# Patient Record
Sex: Female | Born: 1995
Health system: Southern US, Community
[De-identification: ages and names within clinical notes are randomized; demographics above are authoritative.]

## PROBLEM LIST (undated history)

## (undated) DIAGNOSIS — F419 Anxiety disorder, unspecified: Secondary | ICD-10-CM

## (undated) DIAGNOSIS — Z87442 Personal history of urinary calculi: Secondary | ICD-10-CM

## (undated) DIAGNOSIS — N83201 Unspecified ovarian cyst, right side: Secondary | ICD-10-CM

## (undated) DIAGNOSIS — D649 Anemia, unspecified: Secondary | ICD-10-CM

## (undated) DIAGNOSIS — N200 Calculus of kidney: Secondary | ICD-10-CM

## (undated) HISTORY — DX: Anxiety disorder, unspecified: F41.9

## (undated) HISTORY — DX: Anemia, unspecified: D64.9

## (undated) HISTORY — DX: Calculus of kidney: N20.0

## (undated) HISTORY — PX: KNEE SURGERY: SHX244

---
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
## 2017-03-04 ENCOUNTER — Encounter: Payer: Self-pay | Admitting: Family Medicine

## 2017-03-04 ENCOUNTER — Ambulatory Visit (INDEPENDENT_AMBULATORY_CARE_PROVIDER_SITE_OTHER): Payer: 59 | Admitting: Family Medicine

## 2017-03-04 VITALS — BP 118/74 | HR 73 | Temp 98.2°F | Resp 16 | Ht 63.0 in | Wt 131.8 lb

## 2017-03-04 DIAGNOSIS — F411 Generalized anxiety disorder: Secondary | ICD-10-CM

## 2017-03-04 DIAGNOSIS — F410 Panic disorder [episodic paroxysmal anxiety] without agoraphobia: Secondary | ICD-10-CM | POA: Diagnosis not present

## 2017-03-04 DIAGNOSIS — Z87448 Personal history of other diseases of urinary system: Secondary | ICD-10-CM

## 2017-03-04 DIAGNOSIS — G8929 Other chronic pain: Secondary | ICD-10-CM

## 2017-03-04 DIAGNOSIS — Z7689 Persons encountering health services in other specified circumstances: Secondary | ICD-10-CM

## 2017-03-04 DIAGNOSIS — D500 Iron deficiency anemia secondary to blood loss (chronic): Secondary | ICD-10-CM | POA: Diagnosis not present

## 2017-03-04 DIAGNOSIS — R109 Unspecified abdominal pain: Secondary | ICD-10-CM

## 2017-03-04 DIAGNOSIS — N920 Excessive and frequent menstruation with regular cycle: Secondary | ICD-10-CM | POA: Diagnosis not present

## 2017-03-04 DIAGNOSIS — N200 Calculus of kidney: Secondary | ICD-10-CM | POA: Insufficient documentation

## 2017-03-04 DIAGNOSIS — Z87442 Personal history of urinary calculi: Secondary | ICD-10-CM | POA: Diagnosis not present

## 2017-03-04 DIAGNOSIS — D649 Anemia, unspecified: Secondary | ICD-10-CM | POA: Insufficient documentation

## 2017-03-04 MED ORDER — ESCITALOPRAM OXALATE 10 MG PO TABS: 10.0000 mg | ORAL_TABLET | Freq: Every day | ORAL | 11 refills | Status: DC

## 2017-03-04 MED ORDER — FERROUS SULFATE DRIED ER 160 (50 FE) MG PO TBCR: 160.0000 mg | EXTENDED_RELEASE_TABLET | Freq: Every day | ORAL | 11 refills | Status: DC

## 2017-03-04 NOTE — Assessment & Plan Note (Signed)
Likely secondary to prior nephrolithiasis Improved on last UA 11/2016

## 2017-03-04 NOTE — Assessment & Plan Note (Signed)
Suspected acute on chronic GAD now with gradual worsening causing more difficulty functioning, previously coped well now with stressors of college / full time job. Complicated with panic symptoms. -GAD7: 8, somewhat difficult / PHQ9: 0 - Failed: Fluoxetine (discontinued, preference no side effects), Celexa (groggy side effects) - Prior Psychiatry [place]  Plan: 1. Discussion on GAD /anxiety, management, complications with panic 2. Start Escitalopram 10mg  daily AM with food, counseling on potential side effects risks, reviewed possible GI intolerance, insomnia (although likely to improve this given anxiety likely source of insomnia), sexual dysfunction, reviewed black box warning inc suicidal (no prior history, unlikely concern) - anticipate 4-6 weeks for notable effect, may need titrate dose to 20 in future 3. Advised recommend therapy / counseling in future - handout given with self referral info, also consider future return to Psychiatry if needed 4. Follow-up 4-6 weeks anxiety, med adjust, GAD7/PHQ9 - or if improved on med, no concerns, can continue current dose and follow-up 6 months

## 2017-03-04 NOTE — Progress Notes (Signed)
Subjective:    Patient ID: Patricia Pearson, female    DOB: 08-22-1996, 21 y.o.   MRN: [id number]  Patricia Pearson is a 21 y.o. female presenting on 03/04/2017 for Establish Care  Previous PCP Dr Raechel Ache [facility]). Here to establish care, after her Mother Loel Dubonnet) recently also established care with me here at [facility].  HPI  Anxiety: - Reports chronic problem since young age, described feeling stressed and anxious even in public places as child. Significant past history with followed by Psychiatry in [place], previously treated on medication, thinks this was Fluoxetine, did well but then ultimately her mother decided to discontinue her on it due to young age, later had worse anxiety and returned, she was started on Celexa in past >1-2 years ago had problems with sedation and feeling "groggy" on this medication, since discontinued. Additionally in past has been treated in ED 02/2016 with Ativan PRN for panic attacks but did not take this medication long. - Today here to discuss some gradually worsening anxiety and interested in resuming medical therapy. Attributes recent problems to increased stress with both college [employer] program and also working as full Public house manager), often difficulty focusing on job with stress. Most days if anxiety is not bothering her she functions well. She can have some panic attacks about 2-4 times a month usually, recently a little worse. Describes symptoms with panic as feeling jittery, dyspnea, hard to catch breath, heart racing, palpitations, if only minor stressor lasts few minutes, if more significant stressor can last hours, for instance argument with boyfriend recently, since resolved - Family history of anxiety with mother. Parents divorced when younger. Father passed when she was 19 years old, he was into drinking and drugs. Has younger sister age 53 without health problems. - Limited caffeine. Occasional energy drink if working long 12+ hour  shift - Feels safe in current relationship and at home - Denies regular insomnia, staying awake - Denies any sadness, depression or mood disorder, suicidal or homicidal ideation  Anemia, Iron Deficiency, history of chronic blood loss / history of menorrhagia - Reviews prior history of episode 02/2016, at work, felt dizzy, weak, almost passed out, went to ED, had Hgb 10s and MCV 70, with improvement on oral OTC iron daily 160mg  slow release. Has history of heavy menstrual bleeding, on OCP on for 2 years, had been off and on since age 68, still has mild to moderate amount of bleeding, now only has 3-4 days of bleeding with regular monthly cycles, previously had longer duration about 7 days - Currently doing well without complaints - Last labs done with CBC 11/2016, improved Hgb and MCV - Requests refill on her iron pill - Denies any dyspnea, CP, fatigue, near syncope  History of Chronic Kidney Stones - Prior chronic history with significant R nephrolithiasis previously, followed by [facility] Dr Marcelline Mates had VCUG and Cystourethroscopy with Stent placement and stone removal. See outside records in [place]. Also has had history of microscopic hematuria in past, thought related to nephrolithiasis. - Now also reports recent history of similar symptoms R sided flank discomfort concern for possible kidney stone recurrence. Recently saw prior PCP 11/30/16, for same issue, R side, same as before, was not straining urine, did not have color change, increased water and took medicines with improvement after 1 week. - She can feel some discomfort on R side if drinks tea and soda and less water intake, was told in past "Right kidney drains slower compared  to Left", thought due to "old scarring"  GAD 7 : Generalized Anxiety Score 03/04/2017 03/04/2017  Nervous, Anxious, on Edge 2 2  Control/stop worrying 3 3  Worry too much - different things 3 3  Trouble relaxing 0 0  Restless 0 0  Easily annoyed  or irritable 0 0  Afraid - awful might happen 0 0  Total GAD 7 Score 8 8  Anxiety Difficulty - Somewhat difficult   Depression screen PHQ 2/9 03/04/2017  Decreased Interest 0  Down, Depressed, Hopeless 0  PHQ - 2 Score 0    Past Medical History:  Diagnosis Date  . Anemia   . Anxiety   . Kidney stone    Past Surgical History:  Procedure Laterality Date  . WISDOM TOOTH EXTRACTION  2016   Social History   Social History  . Marital status: Single    Spouse name: N/A  . Number of children: N/A  . Years of education: College   Occupational History  . 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 Smoking status: Never Smoker  . Smokeless tobacco: Never Used  . Alcohol use No  . Drug use: No  . Sexual activity: Not on file   Other Topics Concern  . Not on file   Social History Narrative  . No narrative on file   Family History  Problem Relation Age of Onset  . Basal cell carcinoma Mother   . Anxiety disorder Mother   . Drug abuse Father   . Peptic Ulcer Father   . Heart failure Maternal Grandfather   . Lung cancer Paternal Grandmother    No current outpatient prescriptions on file prior to visit.   No current facility-administered medications on file prior to visit.     Review of Systems  Constitutional: Negative for activity change, appetite change, chills, diaphoresis, fatigue, fever and unexpected weight change.  HENT: Negative for congestion, hearing loss and sinus pressure.   Eyes: Negative for visual disturbance.  Respiratory: Negative for cough, chest tightness, shortness of breath and wheezing.   Cardiovascular: Negative for chest pain, palpitations and leg swelling.  Gastrointestinal: Negative for abdominal pain, constipation, diarrhea, nausea and vomiting.  Endocrine: Negative for cold intolerance and polyuria.  Genitourinary: Negative for decreased urine volume,  difficulty urinating, dysuria, flank pain (Resolved R flank pain), frequency, hematuria, menstrual problem (Improved menorrhagia now on OCP), pelvic pain and urgency.  Musculoskeletal: Negative for arthralgias, back pain and neck pain.  Skin: Negative for rash.  Allergic/Immunologic: Negative for environmental allergies.  Neurological: Negative for dizziness, weakness, light-headedness, numbness and headaches.  Hematological: Negative for adenopathy.  Psychiatric/Behavioral: Negative for agitation, behavioral problems, decreased concentration, dysphoric mood, self-injury, sleep disturbance and suicidal ideas. The patient is nervous/anxious. The patient is not hyperactive.    Per HPI unless specifically indicated above     Objective:    BP 118/74   Pulse 73   Temp 98.2 F (36.8 C) (Oral)   Resp 16   Ht 5\' 3"  (1.6 m)   Wt 131 lb 12.8 oz (59.8 kg)   LMP 02/03/2017   BMI 23.35 kg/m   Wt Readings from Last 3 Encounters:  03/04/17 131 lb 12.8 oz (59.8 kg) (61 %, Z= 0.29)*  02/25/16 130 lb (59 kg) (63 %, Z= 0.33)*   * Growth percentiles are based on CDC 2-20 Years data.    Physical Exam  Constitutional: She is oriented  to person, place, and time. She appears well-developed and well-nourished. No distress.  Well-appearing, comfortable, cooperative  HENT:  Head: Normocephalic and atraumatic.  Mouth/Throat: Oropharynx is clear and moist.  Eyes: Conjunctivae and EOM are normal. Pupils are equal, round, and reactive to light.  Neck: Normal range of motion. Neck supple. No thyromegaly present.  Cardiovascular: Normal rate, regular rhythm, normal heart sounds and intact distal pulses.   No murmur heard. Pulmonary/Chest: Effort normal and breath sounds normal. No respiratory distress. She has no wheezes. She has no rales.  Abdominal: Soft. Bowel sounds are normal. She exhibits no distension. There is no tenderness.  Musculoskeletal: Normal range of motion. She exhibits no edema or  tenderness.  Back normal without deformity or abnormal curvature.  No CVAT or flank pain  Lymphadenopathy:    She has no cervical adenopathy.  Neurological: She is alert and oriented to person, place, and time.  Distal sensation to light touch normal  Skin: Skin is warm and dry. No rash noted. She is not diaphoretic.  Psychiatric: She has a normal mood and affect. Her behavior is normal.  Well groomed, good eye contact, normal speech and thoughts. Does not appear anxious.  Nursing note and vitals reviewed.  Results for orders placed or performed during the hospital encounter of 33/00/76  Basic metabolic panel  Result Value Ref Range   Sodium 138 135 - 145 mmol/L   Potassium 4.0 3.5 - 5.1 mmol/L   Chloride 108 101 - 111 mmol/L   CO2 22 22 - 32 mmol/L   Glucose, Bld 98 65 - 99 mg/dL   BUN 14 6 - 20 mg/dL   Creatinine, Ser 0.75 0.50 - 1.00 mg/dL   Calcium 9.4 8.9 - 10.3 mg/dL   GFR calc non Af Amer NOT CALCULATED >60 mL/min   GFR calc Af Amer NOT CALCULATED >60 mL/min   Anion gap 8 5 - 15  CBC  Result Value Ref Range   WBC 7.6 3.6 - 11.0 K/uL   RBC 4.16 3.80 - 5.20 MIL/uL   Hemoglobin 10.6 (L) 12.0 - 16.0 g/dL   HCT 32.4 (L) 35.0 - 47.0 %   MCV 77.8 (L) 80.0 - 100.0 fL   MCH 25.4 (L) 26.0 - 34.0 pg   MCHC 32.7 32.0 - 36.0 g/dL   RDW 16.7 (H) 11.5 - 14.5 %   Platelets 203 150 - 440 K/uL  Urinalysis complete, with microscopic ([facility] only)  Result Value Ref Range   Color, Urine YELLOW (A) YELLOW   APPearance CLOUDY (A) CLEAR   Glucose, UA NEGATIVE NEGATIVE mg/dL   Bilirubin Urine NEGATIVE NEGATIVE   Ketones, ur 1+ (A) NEGATIVE mg/dL   Specific Gravity, Urine 1.026 1.005 - 1.030   Hgb urine dipstick NEGATIVE NEGATIVE   pH 7.0 5.0 - 8.0   Protein, ur 30 (A) NEGATIVE mg/dL   Nitrite NEGATIVE NEGATIVE   Leukocytes, UA 1+ (A) NEGATIVE   RBC / HPF 6-30 0 - 5 RBC/hpf   WBC, UA 0-5 0 - 5 WBC/hpf   Bacteria, UA RARE (A) NONE SEEN   Squamous Epithelial / LPF 6-30 (A) NONE SEEN    Mucous PRESENT   Glucose, capillary  Result Value Ref Range   Glucose-Capillary 72 65 - 99 mg/dL   Comment 1 Notify RN    Comment 2 Document in Chart   Pregnancy, urine POC  Result Value Ref Range   Preg Test, Ur NEGATIVE NEGATIVE      Assessment & Plan:  Problem List Items Addressed This Visit    Menorrhagia    Now controlled on current OCP LIkely etiology for prior IDA Last CBC normal range 11/2016 Continue iron supplement and OCP Follow-up      RESOLVED: Kidney stones   History of kidney stones    Prior history of R nephrolithiasis with complication requiring stent placement Previously followed by [facility] Recent flare likely R nephrolithiasis 11/2016, self limited and resolved, improved on inc water intake - Currently asymptomatic  Plan: 1. Discussion today about importance of establishing with local Urology or continuing to see [facility] - advised her to contact her previous [facility] Dr Marcelline Mates to check with their office what ages they still see, if needs new local referral to [facility] we can order this or other Urology if prefer, ideally given her complex history she needs establish Urology for acute nephrolithiasis symptoms if available      History of hematuria    Likely secondary to prior nephrolithiasis Improved on last UA 11/2016      Generalized anxiety disorder with panic attacks - Primary    Suspected acute on chronic GAD now with gradual worsening causing more difficulty functioning, previously coped well now with stressors of college / full time job. Complicated with panic symptoms. -GAD7: 8, somewhat difficult / PHQ9: 0 - Failed: Fluoxetine (discontinued, preference no side effects), Celexa (groggy side effects) - Prior Psychiatry [place]  Plan: 1. Discussion on GAD /anxiety, management, complications with panic 2. Start Escitalopram 10mg  daily AM with food, counseling on potential side effects risks, reviewed possible  GI intolerance, insomnia (although likely to improve this given anxiety likely source of insomnia), sexual dysfunction, reviewed black box warning inc suicidal (no prior history, unlikely concern) - anticipate 4-6 weeks for notable effect, may need titrate dose to 20 in future 3. Advised recommend therapy / counseling in future - handout given with self referral info, also consider future return to Psychiatry if needed 4. Follow-up 4-6 weeks anxiety, med adjust, GAD7/PHQ9 - or if improved on med, no concerns, can continue current dose and follow-up 6 months      Relevant Medications   escitalopram (LEXAPRO) 10 MG tablet   Chronic right flank pain    Related to underlying chronic R nephrolithiasis episodes, see A&P history of kidney stones. Also seems mild flare or worsening with dehydration and less water intake. - Currently asymptomatic. - Follow-up with Urology as discussed      Anemia    Consistent with IDA based on CBC labs with MCV 70s, likely menorrhagia also nephrolithiasis possible Asymptomatic Last CBC normal Hgb and MCV, on iron supplement Refill iron today, continue slow release daily Follow-up yearly CBC labs, sooner if needed      Relevant Medications   ferrous sulfate (EQL SLOW RELEASE IRON) 160 (50 Fe) MG TBCR SR tablet    Other Visit Diagnoses    Encounter to establish care with new doctor          Meds ordered this encounter  Medications  . ALAYCEN 1/35 tablet  . ferrous sulfate (EQL SLOW RELEASE IRON) 160 (50 Fe) MG TBCR SR tablet    Sig: Take 1 tablet (160 mg total) by mouth daily.    Dispense:  30 each    Refill:  11  . escitalopram (LEXAPRO) 10 MG tablet    Sig: Take 1 tablet (10 mg total) by mouth daily.    Dispense:  30 tablet  Refill:  11     Follow up plan: Return in about 4 weeks (around 04/01/2017) for Anxiety GAD7.  If significantly improved and does not need to return in 4 weeks, can follow-up 6 months for anxiety, anemia, kidney  stones  Nobie Putnam, DO [facility] 03/04/2017, 7:06 PM

## 2017-03-04 NOTE — Patient Instructions (Addendum)
Thank you for coming to the clinic today.  1.  As discussed, it sounds like your symptoms are primarily related to generalized anxiety disorder with panic. This is a very common problem and be related to several factors, including life stressors. Start treatment with Escitalopram (Lexapro), take 10mg  daily for next 4-6 weeks. As discussed most anxiety medications are also used for mood disorders such as depression, because they work on similar chemicals in your brain. It may take up to 3-4 weeks for the medicine to take full effect and for you to notice a difference, sometimes you may notice it working sooner, otherwise we may need to adjust the dose.  For most patients with anxiety or mood concerns, we generally recommend referral to establish with a therapist or counselor as well. This has been shown to improve the effectiveness of the medications, and in the future we may be able to taper off medications. We will discuss at future appointments.  Psych Counseling ONLY  Self Referral:  1. [facility] [place]   Address: [place], [place], [place] [address] Hours: Open today  9AM-7PM Phone: [phone]  2. [place], [place] Address: [address] [place], [place], [place] [address] Phone: [phone]   [place]  Self Referral [facility]) [place] [address] [place], [place] [address] Phone: [phone]  ---------------------------------------------------------------------------------  Please contact [facility] office for Dr Roderic Palau C. Routh to check what ages of patients they see and if you had a problem with a future kidney stone if they would still see you, or if we need to refer you to local [facility] to discuss this BEFORE you have a problem, because of your complex kidney stone history, I would like you to have a Urologist ahead of  time.  ----------------------------------------------  For anemia, lab results look much better last in 11/2016  Refilled your iron pill, take daily, if not covered by insurance, continue OTC  Please schedule a follow-up appointment with Dr. Parks Ranger in 4-6 weeks for Anxiety (GAD7), med adjust then next visit 6 months for follow-up Anxiety / Anemia / Kidney Stone  If you have any other questions or concerns, please feel free to call the clinic or send a message through [place]. You may also schedule an earlier appointment if necessary.  Nobie Putnam, DO [place]

## 2017-03-04 NOTE — Assessment & Plan Note (Signed)
Prior history of R nephrolithiasis with complication requiring stent placement Previously followed by [facility] Recent flare likely R nephrolithiasis 11/2016, self limited and resolved, improved on inc water intake - Currently asymptomatic  Plan: 1. Discussion today about importance of establishing with local Urology or continuing to see [facility] - advised her to contact her previous [facility] Dr Marcelline Mates to check with their office what ages they still see, if needs new local referral to [facility] we can order this or other Urology if prefer, ideally given her complex history she needs establish Urology for acute nephrolithiasis symptoms if available

## 2017-03-04 NOTE — Assessment & Plan Note (Signed)
Now controlled on current OCP LIkely etiology for prior IDA Last CBC normal range 11/2016 Continue iron supplement and OCP Follow-up

## 2017-03-04 NOTE — Assessment & Plan Note (Signed)
Consistent with IDA based on CBC labs with MCV 70s, likely menorrhagia also nephrolithiasis possible Asymptomatic Last CBC normal Hgb and MCV, on iron supplement Refill iron today, continue slow release daily Follow-up yearly CBC labs, sooner if needed

## 2017-03-04 NOTE — Assessment & Plan Note (Signed)
Related to underlying chronic R nephrolithiasis episodes, see A&P history of kidney stones. Also seems mild flare or worsening with dehydration and less water intake. - Currently asymptomatic. - Follow-up with Urology as discussed

## 2017-04-02 ENCOUNTER — Other Ambulatory Visit: Payer: Self-pay

## 2017-04-02 DIAGNOSIS — F411 Generalized anxiety disorder: Principal | ICD-10-CM

## 2017-04-02 DIAGNOSIS — F410 Panic disorder [episodic paroxysmal anxiety] without agoraphobia: Secondary | ICD-10-CM

## 2017-04-02 MED ORDER — ESCITALOPRAM OXALATE 10 MG PO TABS: 10.0000 mg | ORAL_TABLET | Freq: Every day | ORAL | 3 refills | Status: DC

## 2017-04-02 NOTE — Telephone Encounter (Signed)
Pharmacy requesting 90 day supply refill Last ov 03/04/17 Last filled 03/04/17 Please review. Thank you. sd

## 2017-04-03 ENCOUNTER — Other Ambulatory Visit: Payer: Self-pay

## 2017-04-05 ENCOUNTER — Other Ambulatory Visit: Payer: Self-pay | Admitting: Family Medicine

## 2017-04-05 ENCOUNTER — Encounter: Payer: Self-pay | Admitting: Family Medicine

## 2017-04-05 ENCOUNTER — Ambulatory Visit (INDEPENDENT_AMBULATORY_CARE_PROVIDER_SITE_OTHER): Payer: 59 | Admitting: Family Medicine

## 2017-04-05 VITALS — BP 125/76 | HR 105 | Temp 99.1°F | Resp 16 | Ht 63.0 in | Wt 131.0 lb

## 2017-04-05 DIAGNOSIS — D500 Iron deficiency anemia secondary to blood loss (chronic): Secondary | ICD-10-CM

## 2017-04-05 DIAGNOSIS — J029 Acute pharyngitis, unspecified: Secondary | ICD-10-CM

## 2017-04-05 DIAGNOSIS — F410 Panic disorder [episodic paroxysmal anxiety] without agoraphobia: Secondary | ICD-10-CM

## 2017-04-05 DIAGNOSIS — F411 Generalized anxiety disorder: Secondary | ICD-10-CM

## 2017-04-05 DIAGNOSIS — Z0000 Encounter for general adult medical examination without abnormal findings: Secondary | ICD-10-CM

## 2017-04-05 DIAGNOSIS — J069 Acute upper respiratory infection, unspecified: Secondary | ICD-10-CM

## 2017-04-05 DIAGNOSIS — N920 Excessive and frequent menstruation with regular cycle: Secondary | ICD-10-CM

## 2017-04-05 DIAGNOSIS — Z114 Encounter for screening for human immunodeficiency virus [HIV]: Secondary | ICD-10-CM

## 2017-04-05 LAB — POCT RAPID STREP A (OFFICE): RAPID STREP A SCREEN: NEGATIVE

## 2017-04-05 MED ORDER — IPRATROPIUM BROMIDE 0.06 % NA SOLN: 2.0000 | Freq: Four times a day (QID) | NASAL | 0 refills | Status: DC

## 2017-04-05 NOTE — Patient Instructions (Addendum)
Thank you for coming to the clinic today.  1. 1. It sounds like you have a Upper Respiratory Virus with Pharyngitis (Sore Throat) - this will most likely run it's course in 7 to 10 days. Recommend good hand washing.  Rapid strep swab is NEGATIVE  - Start Atrovent nasal spray decongestant 2 sprays each nostril up to 4 times daily for 5-7 days  - Start OTC DayQuil, NyQuil as needed - May take Claritin / Zyrtec if needed - If congestion is worse, start OTC Mucinex (or may try Mucinex-DM for cough) up to 7-10 days then stop - Drink plenty of fluids to improve congestion - You may try over the counter Nasal Saline spray (Simply Saline, Ocean Spray) as needed to reduce congestion. - Drink warm herbal tea with honey for sore throat - Start taking Tylenol extra strength 1 to 2 tablets every 6-8 hours for aches or fever/chills for next few days as needed. May take Ibuprofen as well if tolerated 200-400mg  every 8 hours as needed.  If symptoms significantly worsening with persistent fevers/chills despite tylenol/ibpurofen, nausea, vomiting unable to tolerate food/fluids or medicine, body aches, or shortness of breath, sinus pain pressure or worsening productive cough, then follow-up for re-evaluation, may seek more immediate care at Urgent Care or ED if more concerned for emergency.  Please schedule a Follow-up Appointment to: Return in about 6 months (around 10/05/2017) for Annual Physical (+Anxiety f/u, GAD/PHQ).  If you have any other questions or concerns, please feel free to call the clinic or send a message through [place]. You may also schedule an earlier appointment if necessary.  Nobie Putnam, DO [place]

## 2017-04-05 NOTE — Progress Notes (Signed)
Subjective:    Patient ID: Patricia Pearson, female    DOB: [birth year], 21 y.o.   MRN: [id number]  Patricia Pearson is a 21 y.o. female presenting on 04/05/2017 for Anxiety (improved)   HPI  FOLLOW-UP Anxiety: - Last visit 03/04/17, established care, discussion on chronic anxiety, see note for background information. She started Escitalopram 10mg  daily at that time, prior failures on Fluoxetine, and Celexa. - Today reports overall much improved anxiety, now feels about 80% better, tolerating Escitalopram well without any side effects. Did not increase dose to 20mg . Does not endorse any grogginess or sedation on medication - No new stressors. 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 Then later in day had worsening nasal and sinus congestion or pressure with fullness. Today has persistent congestion but throat is improved. No known sick contacts. - Tried cough drops yesterday, no other cold medicines. - Admits low grade 99.1, felt subjective chills yesterday  - Denies any sweats, nausea, vomiting, abdominal pain, diarrhea  GAD 7 : Generalized Anxiety Score 04/05/2017 03/04/2017 03/04/2017  Nervous, Anxious, on Edge 1 2 2   Control/stop worrying 1 3 3   Worry too much - different things 1 3 3   Trouble relaxing 0 0 0  Restless 0 0 0  Easily annoyed or irritable 0 0 0  Afraid - awful might happen 0 0 0  Total GAD 7 Score 3 8 8   Anxiety Difficulty Not difficult at all - Somewhat difficult   Depression screen [facility] 2/9 03/04/2017  Decreased Interest 0  Down, Depressed, Hopeless 0  PHQ - 2 Score 0    Review of Systems Per HPI unless specifically indicated above     Objective:    BP 125/76   Pulse (!) 105   Temp 99.1 F (37.3 C) (Oral)   Resp 16   Ht 5\' 3"  (1.6 m)   Wt 131 lb (59.4 kg)   SpO2 100%   BMI 23.21 kg/m   Wt Readings from Last 3 Encounters:  04/05/17 131 lb (59.4 kg) (60 %, Z= 0.25)*  03/04/17 131 lb 12.8 oz (59.8 kg) (61 %, Z= 0.29)*  02/25/16 130 lb (59 kg) (63 %, Z= 0.33)*   * Growth percentiles are based on CDC 2-20 Years data.    Physical Exam  Constitutional: She is oriented to person, place, and time. She appears well-developed and well-nourished. No distress.  Well but currently mildly sick appearing, comfortable, cooperative  HENT:  Head: Normocephalic and atraumatic.  Mouth/Throat: Oropharynx is clear and moist.  Frontal / maxillary sinuses non-tender, mild tenderness bilateral upper nasal region. Nares with turbinate edema and congestion without purulence. Bilateral TMs clear without erythema, effusion or bulging. Oropharynx mild erythema generalized with some postnasal drainage and cobblestoning without exudates or asymmetry.  Eyes: Conjunctivae are normal. Right eye exhibits no discharge. Left eye exhibits no discharge.  Neck: Normal range of motion. Neck supple.  Cardiovascular: Normal rate, regular rhythm, normal heart sounds and intact distal pulses.   No murmur heard. Pulmonary/Chest: Effort normal and breath sounds normal. No respiratory distress. She has no wheezes. She has no rales.  Musculoskeletal: Normal range of motion. She exhibits no edema.  Lymphadenopathy:    She has no cervical  adenopathy.  Neurological: She is alert and oriented to person, place, and time.  Distal sensation to light touch normal  Skin: Skin is warm and dry. She is not diaphoretic.  Psychiatric: She has a normal mood and affect. Her behavior is normal.  Well groomed, good eye contact, normal speech and thoughts. Does not appear anxious.  Nursing note and vitals reviewed.  Results for orders placed or performed in visit on 04/05/17  POCT rapid  strep A  Result Value Ref Range   Rapid Strep A Screen Negative Negative      Assessment & Plan:   Problem List Items Addressed This Visit    Generalized anxiety disorder with panic attacks - Primary    Significantly improved GAD on SSRI, improved function, no further panic attacks -GAD7: 3 (prior, 8 before med) / PHQ9: 0 - Failed: Fluoxetine (discontinued, preference no side effects), Celexa (groggy side effects) - Prior Psychiatry [place]  Plan: 1. Continue Escitalopram 10mg  daily - no dose adjust, no refills has 1 year supply now, advised in future may consider titration of dose up if needed, notify office if need to adjust 2. Advised recommend therapy / counseling in future, not ready yet for this, since she is improved 3. Follow-up 6 months for Annual Physical, labs, GAD7 med adjust       Other Visit Diagnoses    Sore throat       Relevant Orders   POCT rapid strep A (Completed)   Viral URI      Consistent with viral URI symptoms x 1-2 days, without known sick contact. Some viral pharyngitis, non specific, negative rapid strep Low grade temp, well hydrated on exam, no focal signs of infection (ears, throat, lungs clear).  Plan: 1. Reassurance, likely self-limited may worse to cough lasting up to few weeks - Start Atrovent nasal spray decongestant 2 sprays each nostril up to 4 times daily for 5-7 days - May try OTC anti-histamine - If worse cough start Mucinex-DM OTC up to 7-10 days then stop 2. Supportive care with nasal saline, warm herbal tea with honey, 3. Improve hydration 4. Tylenol / Motrin PRN fevers 5. Return criteria given    Relevant Medications   ipratropium (ATROVENT) 0.06 % nasal spray      Meds ordered this encounter  Medications  . ipratropium (ATROVENT) 0.06 % nasal spray    Sig: Place 2 sprays into both nostrils 4 (four) times daily. For up to 5-7 days then stop.    Dispense:  15 mL    Refill:  0     Follow up plan: Return in about 6 months  (around 10/05/2017) for Annual Physical (+Anxiety f/u, GAD/PHQ).  If significantly improved and does not need to return in 4 weeks, can follow-up 6 months for anxiety, anemia, kidney stones  Nobie Putnam, DO [facility] 04/05/2017, 12:55 PM

## 2017-04-05 NOTE — Addendum Note (Signed)
Addended by: Olin Hauser on: 04/05/2017 12:51 PM   Modules accepted: Orders

## 2017-04-05 NOTE — Assessment & Plan Note (Signed)
Significantly improved GAD on SSRI, improved function, no further panic attacks -GAD7: 3 (prior, 8 before med) / PHQ9: 0 - Failed: Fluoxetine (discontinued, preference no side effects), Celexa (groggy side effects) - Prior Psychiatry [place]  Plan: 1. Continue Escitalopram 10mg  daily - no dose adjust, no refills has 1 year supply now, advised in future may consider titration of dose up if needed, notify office if need to adjust 2. Advised recommend therapy / counseling in future, not ready yet for this, since she is improved 3. Follow-up 6 months for Annual Physical, labs, GAD7 med adjust

## 2017-05-07 ENCOUNTER — Telehealth: Payer: Self-pay | Admitting: Family Medicine

## 2017-05-07 DIAGNOSIS — Z30018 Encounter for initial prescription of other contraceptives: Secondary | ICD-10-CM

## 2017-05-07 MED ORDER — ALYACEN 1/35 1-35 MG-MCG PO TABS: 1.0000 | ORAL_TABLET | Freq: Every day | ORAL | 11 refills | Status: DC

## 2017-05-07 NOTE — Telephone Encounter (Signed)
Pt needs a refill on alaycen birth control sent to [facility] in [place].  Her call back number is [phone]

## 2017-05-07 NOTE — Telephone Encounter (Signed)
Refilled OCP

## 2017-06-08 ENCOUNTER — Encounter: Payer: Self-pay | Admitting: Gynecology

## 2017-06-08 ENCOUNTER — Ambulatory Visit
Admission: EM | Admit: 2017-06-08 | Discharge: 2017-06-08 | Disposition: A | Discharge status: Home / Self Care | Payer: 59 | Attending: Family Medicine | Admitting: Family Medicine

## 2017-06-08 DIAGNOSIS — R109 Unspecified abdominal pain: Secondary | ICD-10-CM | POA: Diagnosis not present

## 2017-06-08 DIAGNOSIS — M545 Low back pain: Secondary | ICD-10-CM | POA: Diagnosis not present

## 2017-06-08 LAB — URINALYSIS, COMPLETE (UACMP) WITH MICROSCOPIC
Bilirubin Urine: NEGATIVE
GLUCOSE, UA: NEGATIVE mg/dL
Ketones, ur: 15 mg/dL — AB
Leukocytes, UA: NEGATIVE
Nitrite: NEGATIVE
PROTEIN: 30 mg/dL — AB
Specific Gravity, Urine: 1.025 (ref 1.005–1.030)
pH: 7 (ref 5.0–8.0)

## 2017-06-08 LAB — PREGNANCY, URINE: PREG TEST UR: NEGATIVE

## 2017-06-08 MED ORDER — ONDANSETRON 8 MG PO TBDP
8.0000 mg | ORAL_TABLET | Freq: Once | ORAL | Status: AC
  Administered 2017-06-08: 8 mg via ORAL

## 2017-06-08 MED ORDER — ONDANSETRON 8 MG PO TBDP: 8.0000 mg | ORAL_TABLET | Freq: Three times a day (TID) | ORAL | 0 refills | Status: DC | PRN

## 2017-06-08 MED ORDER — HYDROCODONE-ACETAMINOPHEN 5-325 MG PO TABS: ORAL_TABLET | ORAL | 0 refills | Status: DC

## 2017-06-08 MED ORDER — KETOROLAC TROMETHAMINE 60 MG/2ML IM SOLN
60.0000 mg | Freq: Once | INTRAMUSCULAR | Status: AC
  Administered 2017-06-08: 60 mg via INTRAMUSCULAR

## 2017-06-08 MED ORDER — TAMSULOSIN HCL 0.4 MG PO CAPS: 0.4000 mg | ORAL_CAPSULE | Freq: Every day | ORAL | 0 refills | Status: DC

## 2017-06-08 NOTE — ED Provider Notes (Signed)
[facility]    CSN: [id number] Arrival date & time: 06/08/17  1201     History   Chief Complaint Chief Complaint  Patient presents with  . Back Pain    HPI Patricia Pearson is a 21 y.o. female.   21 yo female with a c/o right flank pain since yesterday associated with nausea. States pain is 7-8/10, waxing and waning. Denies any vomiting,fevers, chills, dysuria, diarrhea, constipation, gross hematuria. States she has a prior history of kidney stones years ago, diagnosed by ultrasound and pain now is similar.    The history is provided by the patient.  Back Pain    Past Medical History:  Diagnosis Date  . Anemia   . Anxiety   . Kidney stone     Patient Active Problem List   Diagnosis Date Noted  . Anemia 03/04/2017  . Generalized anxiety disorder with panic attacks 03/04/2017  . Chronic tension-type headache, not intractable 09/06/2016  . Flank pain 03/21/2015  . Chronic right flank pain 12/07/2014  . History of kidney stones 12/07/2014  . History of hematuria 12/07/2014  . Menorrhagia 04/28/2014    Past Surgical History:  Procedure Laterality Date  . WISDOM TOOTH EXTRACTION  2016    OB History    No data available       Home Medications    Prior to Admission medications   Medication Sig Start Date End Date Taking? Authorizing Provider  ALAYCEN 1/35 tablet Take 1 tablet by mouth daily. 05/07/17  Yes Karamalegos, Devonne Doughty, DO  escitalopram (LEXAPRO) 10 MG tablet Take 1 tablet (10 mg total) by mouth daily. 04/02/17  Yes Karamalegos, Devonne Doughty, DO  ferrous sulfate (EQL SLOW RELEASE IRON) 160 (50 Fe) MG TBCR SR tablet Take 1 tablet (160 mg total) by mouth daily. 03/04/17  Yes Karamalegos, Devonne Doughty, DO  HYDROcodone-acetaminophen (NORCO/VICODIN) 5-325 MG tablet 1-2 tabs po q 8 hours prn 06/08/17   Norval Gable, MD  ipratropium (ATROVENT) 0.06 % nasal spray Place 2 sprays into both nostrils 4 (four) times daily. For up to 5-7 days then stop.  04/05/17   Karamalegos, Devonne Doughty, DO  ondansetron (ZOFRAN ODT) 8 MG disintegrating tablet Take 1 tablet (8 mg total) by mouth every 8 (eight) hours as needed. 06/08/17   Norval Gable, MD  tamsulosin (FLOMAX) 0.4 MG CAPS capsule Take 1 capsule (0.4 mg total) by mouth daily. 06/08/17   Norval Gable, MD    Family History Family History  Problem Relation Age of Onset  . Basal cell carcinoma Mother   . Anxiety disorder Mother   . Drug abuse Father   . Peptic Ulcer Father   . Heart failure Maternal Grandfather   . Lung cancer Paternal Grandmother     Social History Social History  Substance Use Topics  . Smoking status: Never Smoker  . Smokeless tobacco: Never Used  . Alcohol use No     Allergies   Patient has no known allergies.   Review of Systems Review of Systems  Musculoskeletal: Positive for back pain.     Physical Exam Triage Vital Signs ED Triage Vitals  Enc Vitals Group     BP 06/08/17 1240 118/88     Pulse Rate 06/08/17 1240 87     Resp 06/08/17 1240 16     Temp 06/08/17 1240 98.2 F (36.8 C)     Temp Source 06/08/17 1240 Oral     SpO2 06/08/17 1240 100 %     Weight 06/08/17  1242 140 lb (63.5 kg)     Height 06/08/17 1242 5\' 3"  (1.6 m)     Head Circumference --      Peak Flow --      Pain Score 06/08/17 1242 8     Pain Loc --      Pain Edu? --      Excl. in [place]? --    No data found.   Updated Vital Signs BP 118/88 (BP Location: Left Arm)   Pulse 87   Temp 98.2 F (36.8 C) (Oral)   Resp 16   Ht 5\' 3"  (1.6 m)   Wt 140 lb (63.5 kg)   LMP 06/01/2017   SpO2 100%   BMI 24.80 kg/m   Visual Acuity Right Eye Distance:   Left Eye Distance:   Bilateral Distance:    Right Eye Near:   Left Eye Near:    Bilateral Near:     Physical Exam  Constitutional: She appears well-developed and well-nourished. No distress.  Abdominal: Soft. Bowel sounds are normal. She exhibits no distension and no mass. There is tenderness (right flank). There is no  rebound and no guarding.  Skin: She is not diaphoretic.  Nursing note and vitals reviewed.    UC Treatments / Results  Labs (all labs ordered are listed, but only abnormal results are displayed) Labs Reviewed  URINALYSIS, COMPLETE (UACMP) WITH MICROSCOPIC - Abnormal; Notable for the following:       Result Value   APPearance HAZY (*)    Hgb urine dipstick SMALL (*)    Ketones, ur 15 (*)    Protein, ur 30 (*)    Squamous Epithelial / LPF 6-30 (*)    Bacteria, UA RARE (*)    All other components within normal limits  URINE CULTURE  PREGNANCY, URINE    EKG  EKG Interpretation None       Radiology No results found.  Procedures Procedures (including critical care time)  Medications Ordered in UC Medications  ondansetron (ZOFRAN-ODT) disintegrating tablet 8 mg (8 mg Oral Given 06/08/17 1316)  ketorolac (TORADOL) injection 60 mg (60 mg Intramuscular Given 06/08/17 1316)     Initial Impression / Assessment and Plan / UC Course  I have reviewed the triage vital signs and the nursing notes.  Pertinent labs & imaging results that were available during my care of the patient were reviewed by me and considered in my medical decision making (see chart for details).       Final Clinical Impressions(s) / UC Diagnoses   Final diagnoses:  Right flank pain  (likely nephrolithiasis; h/o nephrolithiasis in the past)  New Prescriptions New Prescriptions   HYDROCODONE-ACETAMINOPHEN (NORCO/VICODIN) 5-325 MG TABLET    1-2 tabs po q 8 hours prn   ONDANSETRON (ZOFRAN ODT) 8 MG DISINTEGRATING TABLET    Take 1 tablet (8 mg total) by mouth every 8 (eight) hours as needed.   TAMSULOSIN (FLOMAX) 0.4 MG CAPS CAPSULE    Take 1 capsule (0.4 mg total) by mouth daily.   1. Lab results and diagnosis reviewed with patient and parent 2. Patient given zofran 8mg  odt and toradol 60mg  im x 1 with improvement of symptoms 3. rx as per orders above; reviewed possible side effects, interactions, risks  and benefits  4. Recommend supportive treatment with increased fluids 5. Follow-up prn if symptoms worsen or don't improve   Norval Gable, MD 06/08/17 1321

## 2017-06-08 NOTE — ED Triage Notes (Signed)
Patient c/o right lower back pain x 1 week.

## 2017-06-10 LAB — URINE CULTURE: Special Requests: NORMAL

## 2017-06-11 ENCOUNTER — Emergency Department
Admission: EM | Admit: 2017-06-11 | Discharge: 2017-06-11 | Disposition: A | Discharge status: Home / Self Care | Payer: Commercial Managed Care - HMO | Attending: Emergency Medicine | Admitting: Emergency Medicine

## 2017-06-11 ENCOUNTER — Encounter: Payer: Self-pay | Admitting: Emergency Medicine

## 2017-06-11 ENCOUNTER — Emergency Department: Payer: Commercial Managed Care - HMO

## 2017-06-11 DIAGNOSIS — R109 Unspecified abdominal pain: Secondary | ICD-10-CM | POA: Diagnosis not present

## 2017-06-11 DIAGNOSIS — R300 Dysuria: Secondary | ICD-10-CM | POA: Diagnosis not present

## 2017-06-11 DIAGNOSIS — N200 Calculus of kidney: Secondary | ICD-10-CM | POA: Diagnosis not present

## 2017-06-11 LAB — URINALYSIS, COMPLETE (UACMP) WITH MICROSCOPIC
BACTERIA UA: NONE SEEN
BILIRUBIN URINE: NEGATIVE
GLUCOSE, UA: NEGATIVE mg/dL
Hgb urine dipstick: NEGATIVE
KETONES UR: NEGATIVE mg/dL
LEUKOCYTES UA: NEGATIVE
NITRITE: NEGATIVE
PH: 6 (ref 5.0–8.0)
Protein, ur: NEGATIVE mg/dL
Specific Gravity, Urine: 1.015 (ref 1.005–1.030)

## 2017-06-11 LAB — CBC WITH DIFFERENTIAL/PLATELET
BASOS PCT: 1 %
Basophils Absolute: 0 10*3/uL (ref 0–0.1)
EOS ABS: 0.1 10*3/uL (ref 0–0.7)
EOS PCT: 2 %
HCT: 38.1 % (ref 35.0–47.0)
Hemoglobin: 13.3 g/dL (ref 12.0–16.0)
Lymphocytes Relative: 33 %
Lymphs Abs: 1.7 10*3/uL (ref 1.0–3.6)
MCH: 31.5 pg (ref 26.0–34.0)
MCHC: 34.8 g/dL (ref 32.0–36.0)
MCV: 90.5 fL (ref 80.0–100.0)
MONO ABS: 0.4 10*3/uL (ref 0.2–0.9)
MONOS PCT: 7 %
Neutro Abs: 3.1 10*3/uL (ref 1.4–6.5)
Neutrophils Relative %: 59 %
Platelets: 139 10*3/uL — ABNORMAL LOW (ref 150–440)
RBC: 4.21 MIL/uL (ref 3.80–5.20)
RDW: 11.7 % (ref 11.5–14.5)
WBC: 5.2 10*3/uL (ref 3.6–11.0)

## 2017-06-11 LAB — COMPREHENSIVE METABOLIC PANEL
ALT: 24 U/L (ref 14–54)
ANION GAP: 5 (ref 5–15)
AST: 22 U/L (ref 15–41)
Albumin: 4 g/dL (ref 3.5–5.0)
Alkaline Phosphatase: 38 U/L (ref 38–126)
BUN: 11 mg/dL (ref 6–20)
CO2: 23 mmol/L (ref 22–32)
Calcium: 9.1 mg/dL (ref 8.9–10.3)
Chloride: 106 mmol/L (ref 101–111)
Creatinine, Ser: 0.6 mg/dL (ref 0.44–1.00)
GFR calc Af Amer: >60 mL/min (ref >60)
GFR calc non Af Amer: >60 mL/min (ref >60)
Glucose, Bld: 81 mg/dL (ref 65–99)
POTASSIUM: 4 mmol/L (ref 3.5–5.1)
SODIUM: 134 mmol/L — AB (ref 135–145)
TOTAL PROTEIN: 7 g/dL (ref 6.5–8.1)
Total Bilirubin: 0.5 mg/dL (ref 0.3–1.2)

## 2017-06-11 LAB — POCT PREGNANCY, URINE: Preg Test, Ur: NEGATIVE

## 2017-06-11 MED ORDER — KETOROLAC TROMETHAMINE 30 MG/ML IJ SOLN
30.0000 mg | Freq: Once | INTRAMUSCULAR | Status: AC
  Administered 2017-06-11: 30 mg via INTRAVENOUS
  Filled 2017-06-11: qty 1

## 2017-06-11 MED ORDER — CYCLOBENZAPRINE HCL 10 MG PO TABS: 10.0000 mg | ORAL_TABLET | Freq: Three times a day (TID) | ORAL | 0 refills | Status: DC | PRN

## 2017-06-11 NOTE — ED Triage Notes (Signed)
Seen at urgent care on Saturday, right flank pain, hx of kidney stones.  Pain worsening , dysuria.

## 2017-06-11 NOTE — ED Notes (Signed)
Pt to CT

## 2017-06-11 NOTE — ED Provider Notes (Signed)
[facility] Emergency Department Provider Note       Time seen: ----------------------------------------- 9:31 AM on 06/11/2017 -----------------------------------------     I have reviewed the triage vital signs and the nursing notes.   HISTORY   Chief Complaint Flank Pain    HPI Patricia Pearson is a 21 y.o. female who presents to the ED for right flank pain. Patient was seen in urgent care on Saturday and was diagnosed with likely kidney stone. Patient reports worsening pain and she's had some dysuria as well. Pain is sharp, 6 out of 10. Nothing makes it better or worse.    Past Medical History:  Diagnosis Date  . Anemia   . Anxiety   . Kidney stone     Patient Active Problem List   Diagnosis Date Noted  . Anemia 03/04/2017  . Generalized anxiety disorder with panic attacks 03/04/2017  . Chronic tension-type headache, not intractable 09/06/2016  . Flank pain 03/21/2015  . Chronic right flank pain 12/07/2014  . History of kidney stones 12/07/2014  . History of hematuria 12/07/2014  . Menorrhagia 04/28/2014    Past Surgical History:  Procedure Laterality Date  . WISDOM TOOTH EXTRACTION  2016    Allergies Patient has no known allergies.  Social History Social History  Substance Use Topics  . Smoking status: Never Smoker  . Smokeless tobacco: Never Used  . Alcohol use No    Review of Systems Constitutional: Negative for fever. Cardiovascular: Negative for chest pain. Respiratory: Negative for shortness of breath. Gastrointestinal: Positive for flank pain Genitourinary: Positive for dysuria Musculoskeletal: Negative for back pain. Skin: Negative for rash. Neurological: Negative for headaches, focal weakness or numbness.  All systems negative/normal/unremarkable except as stated in the HPI  ____________________________________________   PHYSICAL EXAM:  VITAL SIGNS: ED Triage Vitals [06/11/17 0908]  Enc Vitals Group      BP (!) 141/88     Pulse Rate (!) 108     Resp 18     Temp 98.2 F (36.8 C)     Temp Source Oral     SpO2 99 %     Weight 135 lb (61.2 kg)     Height 5\' 3"  (1.6 m)     Head Circumference      Peak Flow      Pain Score 6     Pain Loc      Pain Edu?      Excl. in [place]?     Constitutional: Alert and oriented. Well appearing and in no distress. Eyes: Conjunctivae are normal. Normal extraocular movements. ENT   Head: Normocephalic and atraumatic.   Nose: No congestion/rhinnorhea.   Mouth/Throat: Mucous membranes are moist.   Neck: No stridor. Cardiovascular: Normal rate, regular rhythm. No murmurs, rubs, or gallops. Respiratory: Normal respiratory effort without tachypnea nor retractions. Breath sounds are clear and equal bilaterally. No wheezes/rales/rhonchi. Gastrointestinal: Soft and nontender. Normal bowel sounds Musculoskeletal: Nontender with normal range of motion in extremities. No lower extremity tenderness nor edema. Neurologic:  Normal speech and language. No gross focal neurologic deficits are appreciated.  Skin:  Skin is warm, dry and intact. No rash noted. Psychiatric: Mood and affect are normal. Speech and behavior are normal.  ____________________________________________  ED COURSE:  Pertinent labs & imaging results that were available during my care of the patient were reviewed by me and considered in my medical decision making (see chart for details). Patient presents for flank pain, we will assess with labs and imaging as  indicated.   Procedures ____________________________________________   LABS (pertinent positives/negatives)  Labs Reviewed  URINALYSIS, COMPLETE (UACMP) WITH MICROSCOPIC - Abnormal; Notable for the following:       Result Value   Color, Urine YELLOW (*)    APPearance CLEAR (*)    Squamous Epithelial / LPF 0-5 (*)    All other components within normal limits  CBC WITH DIFFERENTIAL/PLATELET - Abnormal; Notable for the  following:    Platelets 139 (*)    All other components within normal limits  COMPREHENSIVE METABOLIC PANEL - Abnormal; Notable for the following:    Sodium 134 (*)    All other components within normal limits  POCT PREGNANCY, URINE    RADIOLOGY Images were viewed by me  CT renal protocol IMPRESSION: Tiny nonobstructing right renal stone. No obstructive changes are identified. ____________________________________________  FINAL ASSESSMENT AND PLAN  Flank pain  Plan: Patient's labs and imaging were dictated above. Patient had presented for Flank pain of uncertain etiology. No intrarenal stone was identified in her urine was clear. She is stable for outpatient follow-up with muscle relaxants.   Earleen Newport, MD   Note: This note was generated in part or whole with voice recognition software. Voice recognition is usually quite accurate but there are transcription errors that can and very often do occur. I apologize for any typographical errors that were not detected and corrected.     Earleen Newport, MD 06/11/17 1041

## 2017-06-11 NOTE — ED Notes (Signed)
Discussed discharge instructions, prescriptions, and follow-up care with patient. No questions or concerns at this time. Pt stable at discharge.  

## 2017-06-11 NOTE — ED Triage Notes (Signed)
First Nurse Note:  C/O right low back / flank pain since Saturday.  Seen through Urgent Care on Saturday and had urine checked-- HGB seen.  Patient states she has history of kidney stones and pain is similar.  Denies dysuria.  AAOx3.  Skin warm and dry.  NAD

## 2017-06-23 DIAGNOSIS — M791 Myalgia: Secondary | ICD-10-CM | POA: Diagnosis not present

## 2017-06-23 DIAGNOSIS — Z79899 Other long term (current) drug therapy: Secondary | ICD-10-CM | POA: Insufficient documentation

## 2017-06-23 DIAGNOSIS — M546 Pain in thoracic spine: Secondary | ICD-10-CM | POA: Diagnosis not present

## 2017-06-23 LAB — POCT PREGNANCY, URINE: Preg Test, Ur: NEGATIVE

## 2017-06-23 LAB — URINALYSIS, ROUTINE W REFLEX MICROSCOPIC
BILIRUBIN URINE: NEGATIVE
GLUCOSE, UA: NEGATIVE mg/dL
Hgb urine dipstick: NEGATIVE
KETONES UR: NEGATIVE mg/dL
Leukocytes, UA: NEGATIVE
NITRITE: NEGATIVE
PH: 5 (ref 5.0–8.0)
Protein, ur: NEGATIVE mg/dL
SPECIFIC GRAVITY, URINE: 1.026 (ref 1.005–1.030)

## 2017-06-23 NOTE — ED Triage Notes (Signed)
Patient reports right lower back pain, denies any urinary symptoms.

## 2017-06-24 ENCOUNTER — Emergency Department
Admission: EM | Admit: 2017-06-24 | Discharge: 2017-06-24 | Disposition: A | Discharge status: Home / Self Care | Payer: 59 | Attending: Emergency Medicine | Admitting: Emergency Medicine

## 2017-06-24 DIAGNOSIS — M7918 Myalgia, other site: Secondary | ICD-10-CM

## 2017-06-24 DIAGNOSIS — M546 Pain in thoracic spine: Secondary | ICD-10-CM

## 2017-06-24 LAB — POCT PREGNANCY, URINE: Preg Test, Ur: NEGATIVE

## 2017-06-24 MED ORDER — LIDOCAINE 5 % EX PTCH
1.0000 | MEDICATED_PATCH | CUTANEOUS | Status: DC
  Administered 2017-06-24: 1 via TRANSDERMAL
  Filled 2017-06-24: qty 1

## 2017-06-24 MED ORDER — DIAZEPAM 2 MG PO TABS: 2.0000 mg | ORAL_TABLET | Freq: Four times a day (QID) | ORAL | 0 refills | Status: DC | PRN

## 2017-06-24 MED ORDER — LIDOCAINE 5 % EX PTCH: 1.0000 | MEDICATED_PATCH | Freq: Two times a day (BID) | CUTANEOUS | 0 refills | Status: DC

## 2017-06-24 MED ORDER — KETOROLAC TROMETHAMINE 60 MG/2ML IM SOLN
60.0000 mg | Freq: Once | INTRAMUSCULAR | Status: AC
  Administered 2017-06-24: 60 mg via INTRAMUSCULAR
  Filled 2017-06-24: qty 2

## 2017-06-24 NOTE — ED Notes (Signed)
Pt c/o right lower back pain. Pt denies urinary symptoms.

## 2017-06-24 NOTE — ED Provider Notes (Signed)
[facility] Emergency Department Provider Note   ____________________________________________   First MD Initiated Contact with Patient 06/24/17 0209     (approximate)  I have reviewed the triage vital signs and the nursing notes.   HISTORY  Chief Complaint Back Pain    HPI Patricia Pearson is a 21 y.o. female who comes into the hospital today with some back pain. The patient reports that the pain is on the right side of her back. It is worse when she walks or moves. She states it started yesterday. It was hurting really bad so she had to leave work due to the pain. She reports that she took a muscle relaxer and some Vicodin which she had been prescribed previously but she states that it didn't help. The patient denies any trauma. She reports that she carries a heavy book bag on her back but no other injury. She has had back pain in the past and she reports it was due to a kidney stone. She states though that the pain was more constant and this is worse when she standing and when she is laying. The patient states the pain is a 7 out of 10 in intensity. The patient has no radiation of her pain.   Past Medical History:  Diagnosis Date  . Anemia   . Anxiety   . Kidney stone     Patient Active Problem List   Diagnosis Date Noted  . Anemia 03/04/2017  . Generalized anxiety disorder with panic attacks 03/04/2017  . Chronic tension-type headache, not intractable 09/06/2016  . Flank pain 03/21/2015  . Chronic right flank pain 12/07/2014  . History of kidney stones 12/07/2014  . History of hematuria 12/07/2014  . Menorrhagia 04/28/2014    Past Surgical History:  Procedure Laterality Date  . WISDOM TOOTH EXTRACTION  2016    Prior to Admission medications   Medication Sig Start Date End Date Taking? Authorizing Provider  ALAYCEN 1/35 tablet Take 1 tablet by mouth daily. 05/07/17   Karamalegos, Devonne Doughty, DO  cyclobenzaprine (FLEXERIL) 10 MG tablet  Take 1 tablet (10 mg total) by mouth 3 (three) times daily as needed for muscle spasms. 06/11/17   Earleen Newport, MD  diazepam (VALIUM) 2 MG tablet Take 1 tablet (2 mg total) by mouth every 6 (six) hours as needed for anxiety. 06/24/17   Loney Hering, MD  escitalopram (LEXAPRO) 10 MG tablet Take 1 tablet (10 mg total) by mouth daily. 04/02/17   Karamalegos, Devonne Doughty, DO  ferrous sulfate (EQL SLOW RELEASE IRON) 160 (50 Fe) MG TBCR SR tablet Take 1 tablet (160 mg total) by mouth daily. 03/04/17   Karamalegos, Devonne Doughty, DO  HYDROcodone-acetaminophen (NORCO/VICODIN) 5-325 MG tablet 1-2 tabs po q 8 hours prn 06/08/17   Norval Gable, MD  ipratropium (ATROVENT) 0.06 % nasal spray Place 2 sprays into both nostrils 4 (four) times daily. For up to 5-7 days then stop. 04/05/17   Karamalegos, Devonne Doughty, DO  lidocaine (LIDODERM) 5 % Place 1 patch onto the skin every 12 (twelve) hours. Remove & Discard patch within 12 hours or as directed by MD 06/24/17 06/24/18  Loney Hering, MD  ondansetron (ZOFRAN ODT) 8 MG disintegrating tablet Take 1 tablet (8 mg total) by mouth every 8 (eight) hours as needed. 06/08/17   Norval Gable, MD  tamsulosin (FLOMAX) 0.4 MG CAPS capsule Take 1 capsule (0.4 mg total) by mouth daily. 06/08/17   Norval Gable, MD  Allergies Patient has no known allergies.  Family History  Problem Relation Age of Onset  . Basal cell carcinoma Mother   . Anxiety disorder Mother   . Drug abuse Father   . Peptic Ulcer Father   . Heart failure Maternal Grandfather   . Lung cancer Paternal Grandmother     Social History Social History  Substance Use Topics  . Smoking status: Never Smoker  . Smokeless tobacco: Never Used  . Alcohol use No    Review of Systems  Constitutional: No fever/chills Eyes: No visual changes. ENT: No sore throat. Cardiovascular: Denies chest pain. Respiratory: Denies shortness of breath. Gastrointestinal: No abdominal pain.  No nausea, no  vomiting.  No diarrhea.  No constipation. Genitourinary: Negative for dysuria. Musculoskeletal:  back pain. Skin: Negative for rash. Neurological: Negative for headaches, focal weakness or numbness.   ____________________________________________   PHYSICAL EXAM:  VITAL SIGNS: ED Triage Vitals  Enc Vitals Group     BP 06/23/17 2141 (!) 130/100     Pulse Rate 06/23/17 2141 100     Resp 06/23/17 2141 (!) 22     Temp 06/23/17 2141 98.4 F (36.9 C)     Temp Source 06/23/17 2141 Oral     SpO2 06/23/17 2141 100 %     Weight --      Height --      Head Circumference --      Peak Flow --      Pain Score 06/23/17 2139 8     Pain Loc --      Pain Edu? --      Excl. in [place]? --     Constitutional: Alert and oriented. Well appearing and in mild distress. Eyes: Conjunctivae are normal. PERRL. EOMI. Head: Atraumatic. Nose: No congestion/rhinnorhea. Mouth/Throat: Mucous membranes are moist.  Oropharynx non-erythematous. Cardiovascular: Normal rate, regular rhythm. Grossly normal heart sounds.  Good peripheral circulation. Respiratory: Normal respiratory effort.  No retractions. Lungs CTAB. Gastrointestinal: Soft and nontender. No distention. Positive bowel sounds Musculoskeletal: Mild tenderness to palpation right flank Neurologic:  Normal speech and language.  Skin:  Skin is warm, dry and intact.  Psychiatric: Mood and affect are normal.   ____________________________________________   LABS (all labs ordered are listed, but only abnormal results are displayed)  Labs Reviewed  URINALYSIS, ROUTINE W REFLEX MICROSCOPIC - Abnormal; Notable for the following:       Result Value   Color, Urine YELLOW (*)    APPearance CLEAR (*)    All other components within normal limits  POC URINE PREG, ED  POCT PREGNANCY, URINE   ____________________________________________  EKG  none ____________________________________________  RADIOLOGY  No results  found.  ____________________________________________   PROCEDURES  Procedure(s) performed: None  Procedures  Critical Care performed: No  ____________________________________________   INITIAL IMPRESSION / ASSESSMENT AND PLAN / ED COURSE  Pertinent labs & imaging results that were available during my care of the patient were reviewed by me and considered in my medical decision making (see chart for details).  This is an 21 year old female who comes into the hospital today with some right-sided flank pain. The patient had a urinalysis drawn that was unremarkable. The patient has had a CT scan in the past 2 weeks which showed some nonobstructing stones in her kidneys. I will give the patient is shot of Toradol as well as a Lidoderm patch. The patient will be discharged home. She did ask for a note for work and I did give her a note  to stay out of work for a couple of days. The patient to follow-up with her primary care physician.      ____________________________________________   FINAL CLINICAL IMPRESSION(S) / ED DIAGNOSES  Final diagnoses:  Musculoskeletal pain  Acute right-sided thoracic back pain      NEW MEDICATIONS STARTED DURING THIS VISIT:  Discharge Medication List as of 06/24/2017  3:06 AM    START taking these medications   Details  diazepam (VALIUM) 2 MG tablet Take 1 tablet (2 mg total) by mouth every 6 (six) hours as needed for anxiety., Starting Mon 06/24/2017, Print    lidocaine (LIDODERM) 5 % Place 1 patch onto the skin every 12 (twelve) hours. Remove & Discard patch within 12 hours or as directed by MD, Starting Mon 06/24/2017, Until Tue 06/24/2018, Print         Note:  This document was prepared using Dragon voice recognition software and may include unintentional dictation errors.    Loney Hering, MD 06/24/17 [phone]

## 2017-07-03 ENCOUNTER — Encounter: Payer: Self-pay | Admitting: Family Medicine

## 2017-07-03 ENCOUNTER — Ambulatory Visit (INDEPENDENT_AMBULATORY_CARE_PROVIDER_SITE_OTHER): Payer: 59 | Admitting: Family Medicine

## 2017-07-03 VITALS — BP 128/89 | HR 104 | Temp 97.8°F | Resp 16 | Ht 63.0 in | Wt 135.0 lb

## 2017-07-03 DIAGNOSIS — M545 Low back pain, unspecified: Secondary | ICD-10-CM

## 2017-07-03 MED ORDER — PREDNISONE 10 MG PO TABS: ORAL_TABLET | ORAL | 0 refills | Status: DC

## 2017-07-03 MED ORDER — NAPROXEN 500 MG PO TABS: 500.0000 mg | ORAL_TABLET | Freq: Two times a day (BID) | ORAL | 0 refills | Status: DC

## 2017-07-03 MED ORDER — BACLOFEN 10 MG PO TABS: 5.0000 mg | ORAL_TABLET | Freq: Three times a day (TID) | ORAL | 1 refills | Status: DC | PRN

## 2017-07-03 NOTE — Progress Notes (Signed)
Subjective:    Patient ID: Patricia Pearson, female    DOB: 1996/04/22, 21 y.o.   MRN: [id number]  Patricia Pearson is a 21 y.o. female presenting on 07/03/2017 for Back Pain (RLQ back pain onset 3 weeks )  Patient presents for a same day appointment.  HPI   ED FOLLOW-UP VISIT  Hospital/Location: [facility] ED Date of ED Visit: Multiple visits (8/4, 8/7, 8/20)  Reason for Presenting to ED: R flank and Low Back Pain Primary (+Secondary) Diagnosis: MSK Back Pain  FOLLOW-UP - ED provider note and record have been reviewed for past 3 visits to ED - Patient presents today about 9 days after most recent ED visit. Brief summary of recent course, patient had symptoms of R bank pain persisting for >3 weeks, has already been to ED x 3 visits in 3 weeks, has had extensive work-up for ruling out various causes, pregnancy was negative, initially thought R nephrolithiasis, given Toradol, Zofran, Flomax, hydrocodone, returned to ED had UA unremarkable, CT Renal Stone study did not confirm stone, only showed tiny non obstructing R renal stone, given muscle relaxant, then she improved until more recently went back to ED with R low back pain worse despite treatments previously, attributes it to carrying heavy bookbag, repeat UA unremarkable, no repeat imaging, given toradol and lidoderm patch, also short term Diazepam for muscle spasms, discharged. - Today reports overall has fairly well since discharge, still has problems with same pain, now has no pain at rest but if active and moving, prolonged standing bending forward has worse pain, worse with carrying heavy backpack. Denies any acute injury trauma or provoking problem but thinks it started 3 weeks ago when started carrying backpack. Pain is mostly localized without radiation - She has finished Diazepam, Hydrocodone. Some relief on Diazepam. Finished Flexeril limited relief. - Takes some Aleve OTC 1-2 daily PRN - Tried Lidocaine patches, heating pad, icy  hot, limited relief - Symptoms do not feel like kidney stone, previously more constant and pinching regardless of position years ago, and this feels very different - Working at [employer] , now working Production manager, previously was doing more bending and lifting repetitive motions to get to freezer below counter - No prior back problems in past. Mother has history of scoliosis - Denies any dysuria, urinary frequency, hematuria, fever/chills, injury  Social History  Substance Use Topics  . Smoking status: Never Smoker  . Smokeless tobacco: Never Used  . Alcohol use No    Review of Systems Per HPI unless specifically indicated above     Objective:    BP 128/89   Pulse (!) 104   Temp 97.8 F (36.6 C) (Oral)   Resp 16   Ht 5\' 3"  (1.6 m)   Wt 135 lb (61.2 kg)   BMI 23.91 kg/m   Wt Readings from Last 3 Encounters:  07/03/17 135 lb (61.2 kg) (65 %, Z= 0.39)*  06/11/17 135 lb (61.2 kg) (65 %, Z= 0.39)*  06/08/17 140 lb (63.5 kg) (72 %, Z= 0.59)*   * Growth percentiles are based on CDC 2-20 Years data.    Physical Exam  Constitutional: She is oriented to person, place, and time. She appears well-developed and well-nourished. No distress.  Well-appearing, comfortable, cooperative  HENT:  Head: Normocephalic and atraumatic.  Mouth/Throat: Oropharynx is clear and moist.  Eyes: Conjunctivae are normal. Right eye exhibits no discharge. Left eye exhibits no discharge.  Cardiovascular: Intact distal pulses.   Pulmonary/Chest: Effort normal and breath sounds normal. No  respiratory distress. She has no wheezes. She has no rales.  Musculoskeletal: Normal range of motion. She exhibits no edema.  Low Back Inspection: Normal appearance, thin body habitus, no spinal deformity, symmetrical. Palpation: No tenderness over spinous processes. Bilateral lumbar paraspinal muscles non-tender but some R>L paraspinal hypertonicity ROM: Full active ROM forward flex / back extension, rotation slightly limited  rotation to R with some discomfort Special Testing: Seated SLR negative for radicular pain bilaterally, but has some "pulling" or tightness in low back.  Standing facet load test positive R back pain  Strength: Bilateral hip flex/ext 5/5, knee flex/ext 5/5, ankle dorsiflex/plantarflex 5/5 Neurovascular: intact distal sensation to light touch  Neurological: She is alert and oriented to person, place, and time.  Skin: Skin is warm and dry. No rash noted. She is not diaphoretic. No erythema.  Psychiatric: She has a normal mood and affect. Her behavior is normal.  Well groomed, good eye contact, normal speech and thoughts  Nursing note and vitals reviewed.    CLINICAL DATA:  Right-sided flank pain for several days, initial encounter  EXAM: CT ABDOMEN AND PELVIS WITHOUT CONTRAST  TECHNIQUE: Multidetector CT imaging of the abdomen and pelvis was performed following the standard protocol without IV contrast.  COMPARISON:  11/23/2011  FINDINGS: Lower chest: No acute abnormality.  Hepatobiliary: No focal liver abnormality is seen. No gallstones, gallbladder wall thickening, or biliary dilatation.  Pancreas: Unremarkable. No pancreatic ductal dilatation or surrounding inflammatory changes.  Spleen: Normal in size without focal abnormality.  Adrenals/Urinary Tract: Adrenal glands are within normal limits. The left kidney and left ureter are unremarkable. A tiny nonobstructing stone is noted in the lower pole of the right kidney. The collecting system is within normal limits. The bladder is partially distended.  Stomach/Bowel: Stomach is within normal limits. Appendix appears normal. No evidence of bowel wall thickening, distention, or inflammatory changes.  Vascular/Lymphatic: No significant vascular findings are present. No enlarged abdominal or pelvic lymph nodes.  Reproductive: Uterus and bilateral adnexa are unremarkable.  Other: No abdominal wall hernia or  abnormality. No abdominopelvic ascites.  Musculoskeletal: No acute or significant osseous findings.  IMPRESSION: Tiny nonobstructing right renal stone. No obstructive changes are identified.   Electronically Signed   By: Inez Catalina M.D.   On: 06/11/2017 09:46  Results for orders placed or performed during the hospital encounter of 06/24/17  Urinalysis, Routine w reflex microscopic  Result Value Ref Range   Color, Urine YELLOW (A) YELLOW   APPearance CLEAR (A) CLEAR   Specific Gravity, Urine 1.026 1.005 - 1.030   pH 5.0 5.0 - 8.0   Glucose, UA NEGATIVE NEGATIVE mg/dL   Hgb urine dipstick NEGATIVE NEGATIVE   Bilirubin Urine NEGATIVE NEGATIVE   Ketones, ur NEGATIVE NEGATIVE mg/dL   Protein, ur NEGATIVE NEGATIVE mg/dL   Nitrite NEGATIVE NEGATIVE   Leukocytes, UA NEGATIVE NEGATIVE  Pregnancy, urine POC  Result Value Ref Range   Preg Test, Ur NEGATIVE NEGATIVE  Pregnancy, urine POC  Result Value Ref Range   Preg Test, Ur NEGATIVE NEGATIVE      Assessment & Plan:   Problem List Items Addressed This Visit    None    Visit Diagnoses    Acute right-sided low back pain without sciatica    -  Primary  Subacute R LBP withou associated sciatica. Suspect likely due to muscle spasm/strain likely with carrying backpack and repetitive motions at work, otherwise without known injury or trauma. - No red flag symptoms. Negative SLR for radiculopathy -  Multiple ED visits raise concern, work-up ruled out symptomatic/obstructing nephrolithiasis, CT showed only tiny non obstructing stone, UA and Upreg negative - Reviewed prior x-rays and CT unremarkable bony structures L spine  Plan: 1. Reassurance and discussed MSK back pain duration and course, goal to avoid re-injury 2. Start Prednisone taper 6 day 60 to 10mg  3. Hold NSAIDs til finished prednisone - then start Naproxen 1-2 weeks then PRN 4. Baclofen PRN - caution sedation 5. Tylenol breakthrough PRN 6. Encouraged use of heating  pad 1-2x daily for now then PRN 7. Handout back exercises 8. Follow-up 4 weeks if not improved - may consider x-ray vs referral to PT  Note out work today and restrictions avoid bending / lifting, only standing register work for 2 weeks    Relevant Medications   baclofen (LIORESAL) 10 MG tablet   predniSONE (DELTASONE) 10 MG tablet   naproxen (NAPROSYN) 500 MG tablet      Meds ordered this encounter  Medications  . baclofen (LIORESAL) 10 MG tablet    Sig: Take 0.5-1 tablets (5-10 mg total) by mouth 3 (three) times daily as needed for muscle spasms.    Dispense:  30 each    Refill:  1  . predniSONE (DELTASONE) 10 MG tablet    Sig: Take 6 tabs with breakfast Day 1, 5 tabs Day 2, 4 tabs Day 3, 3 tabs Day 4, 2 tabs Day 5, 1 tab Day 6.    Dispense:  21 tablet    Refill:  0  . naproxen (NAPROSYN) 500 MG tablet    Sig: Take 1 tablet (500 mg total) by mouth 2 (two) times daily with a meal. For 1-2 weeks then only as needed. Do not take while on prednisone    Dispense:  60 tablet    Refill:  0    Follow up plan: Return in about 4 weeks (around 07/31/2017), or if symptoms worsen or fail to improve, for back pain.  Nobie Putnam, [facility] 07/03/2017, 3:51 PM

## 2017-07-03 NOTE — Patient Instructions (Addendum)
Thank you for coming to the clinic today.  1. For your Back Pain - I think that this is due to Muscle Spasms or strain. Your Sciatic Nerve can be affected causing some of your radiation and numbness down your legs.  Start Prednisone taper over 6 days, initial 6 pills then down by 1 pill each day until completed, take with food - DO NOT take naproxen, ibuprofen, advil, aleve while ON this med  AFTER prednisone start with anti-inflammatory Naprosyn (Naproxen) 500mg  twice daily (12 hrs apart, with food, breakfast and dinner) every day for next 1-2 weeks, may continue as needed for few more weeks if helping, then can use only as needed 3. Start Baclofen (Lioresal) 10mg  tablets - cut in half for 5mg  at night for muscle relaxant - may make you sedated or sleepy (be careful driving or working on this) if tolerated you can take every 8 hours, half or whole tab  4. May use Tylenol Extra Str 500mg  tabs - may take 1-2 tablets every 6 hours as needed  5. Recommend to start using heating pad on your lower back 1-2x daily for few weeks  This pain may take weeks to months to fully resolve, but hopefully it will respond to the medicine initially. All back injuries (small or serious) are slow to heal since we use our back muscles every day. Be careful with turning, twisting, lifting, sitting / standing for prolonged periods, and avoid re-injury.  If your symptoms significantly worsen with more pain, or new symptoms with weakness in one or both legs, new or different shooting leg pains, numbness in legs or groin, loss of control or retention of urine or bowel movements, please call back for advice and you may need to go directly to the Emergency Department.  Also, if improved but not 100% after 2-3 weeks and want to try formal Physical Therapy - call office and we can arrange this.  Please schedule a Follow-up Appointment to: Return in about 4 weeks (around 07/31/2017), or if symptoms worsen or fail to improve, for  back pain.  If you have any other questions or concerns, please feel free to call the clinic or send a message through [place]. You may also schedule an earlier appointment if necessary.  Additionally, you may be receiving a survey about your experience at our clinic within a few days to 1 week by e-mail or mail. We value your feedback.  Patricia Putnam, DO [facility], [facility]             Low Back Pain Exercises  See other page with pictures of each exercise.  Start with 1 or 2 of these exercises that you are most comfortable with. Do not do any exercises that cause you significant worsening pain. Some of these may cause some "stretching soreness" but it should go away after you stop the exercise, and get better over time. Gradually increase up to 3-4 exercises as tolerated.  Standing hamstring stretch: Place the heel of your leg on a stool about 15 inches high. Keep your knee straight. Lean forward, bending at the hips until you feel a mild stretch in the back of your thigh. Make sure you do not roll your shoulders and bend at the waist when doing this or you will stretch your lower back instead. Hold the stretch for 15 to 30 seconds. Repeat 3 times. Repeat the same stretch on your other leg.  Cat and camel: Get down on your hands and knees.  Let your stomach sag, allowing your back to curve downward. Hold this position for 5 seconds. Then arch your back and hold for 5 seconds. Do 3 sets of 10.  Quadriped Arm/Leg Raises: Get down on your hands and knees. Tighten your abdominal muscles to stiffen your spine. While keeping your abdominals tight, raise one arm and the opposite leg away from you. Hold this position for 5 seconds. Lower your arm and leg slowly and alternate sides. Do this 10 times on each side.  Pelvic tilt: Lie on your back with your knees bent and your feet flat on the floor. Tighten your abdominal muscles and push your lower back into the floor.  Hold this position for 5 seconds, then relax. Do 3 sets of 10.  Partial curl: Lie on your back with your knees bent and your feet flat on the floor. Tighten your stomach muscles and flatten your back against the floor. Tuck your chin to your chest. With your hands stretched out in front of you, curl your upper body forward until your shoulders clear the floor. Hold this position for 3 seconds. Don't hold your breath. It helps to breathe out as you lift your shoulders up. Relax. Repeat 10 times. Build to 3 sets of 10. To challenge yourself, clasp your hands behind your head and keep your elbows out to the side.  Lower trunk rotation: Lie on your back with your knees bent and your feet flat on the floor. Tighten your abdominal muscles and push your lower back into the floor. Keeping your shoulders down flat, gently rotate your legs to one side, then the other as far as you can. Repeat 10 to 20 times.  Single knee to chest stretch: Lie on your back with your legs straight out in front of you. Bring one knee up to your chest and grasp the back of your thigh. Pull your knee toward your chest, stretching your buttock muscle. Hold this position for 15 to 30 seconds and return to the starting position. Repeat 3 times on each side.  Double knee to chest: Lie on your back with your knees bent and your feet flat on the floor. Tighten your abdominal muscles and push your lower back into the floor. Pull both knees up to your chest. Hold for 5 seconds and repeat 10 to 20 times.

## 2017-08-19 ENCOUNTER — Other Ambulatory Visit: Payer: 59

## 2017-08-19 DIAGNOSIS — Z0000 Encounter for general adult medical examination without abnormal findings: Secondary | ICD-10-CM

## 2017-08-19 DIAGNOSIS — Z114 Encounter for screening for human immunodeficiency virus [HIV]: Secondary | ICD-10-CM

## 2017-08-19 DIAGNOSIS — N920 Excessive and frequent menstruation with regular cycle: Secondary | ICD-10-CM

## 2017-08-19 DIAGNOSIS — D500 Iron deficiency anemia secondary to blood loss (chronic): Secondary | ICD-10-CM

## 2017-08-19 DIAGNOSIS — F410 Panic disorder [episodic paroxysmal anxiety] without agoraphobia: Secondary | ICD-10-CM

## 2017-08-19 DIAGNOSIS — F411 Generalized anxiety disorder: Secondary | ICD-10-CM

## 2017-08-20 LAB — CBC WITH DIFFERENTIAL/PLATELET
BASOS ABS: 22 {cells}/uL (ref 0–200)
BASOS PCT: 0.4 %
EOS PCT: 1.9 %
Eosinophils Absolute: 103 cells/uL (ref 15–500)
HCT: 39.8 % (ref 35.0–45.0)
HEMOGLOBIN: 13.6 g/dL (ref 11.7–15.5)
Lymphs Abs: 1885 cells/uL (ref 850–3900)
MCH: 31 pg (ref 27.0–33.0)
MCHC: 34.2 g/dL (ref 32.0–36.0)
MCV: 90.7 fL (ref 80.0–100.0)
MONOS PCT: 8.2 %
MPV: 12.3 fL (ref 7.5–12.5)
NEUTROS ABS: 2948 {cells}/uL (ref 1500–7800)
Neutrophils Relative %: 54.6 %
PLATELETS: 189 10*3/uL (ref 140–400)
RBC: 4.39 10*6/uL (ref 3.80–5.10)
RDW: 11.5 % (ref 11.0–15.0)
TOTAL LYMPHOCYTE: 34.9 %
WBC mixed population: 443 cells/uL (ref 200–950)
WBC: 5.4 10*3/uL (ref 3.8–10.8)

## 2017-08-20 LAB — COMPLETE METABOLIC PANEL WITH GFR
AG RATIO: 1.4 (calc) (ref 1.0–2.5)
ALT: 18 U/L (ref 5–32)
AST: 17 U/L (ref 12–32)
Albumin: 4.2 g/dL (ref 3.6–5.1)
Alkaline phosphatase (APISO): 48 U/L (ref 47–176)
BILIRUBIN TOTAL: 0.4 mg/dL (ref 0.2–1.1)
BUN: 13 mg/dL (ref 7–20)
CHLORIDE: 106 mmol/L (ref 98–110)
CO2: 24 mmol/L (ref 20–32)
Calcium: 9.1 mg/dL (ref 8.9–10.4)
Creat: 0.71 mg/dL (ref 0.50–1.00)
GFR, Est African American: 143 mL/min/{1.73_m2} (ref >=60)
GFR, Est Non African American: 123 mL/min/{1.73_m2} (ref >=60)
Globulin: 3.1 g/dL (calc) (ref 2.0–3.8)
Glucose, Bld: 85 mg/dL (ref 65–99)
POTASSIUM: 4.1 mmol/L (ref 3.8–5.1)
SODIUM: 139 mmol/L (ref 135–146)
Total Protein: 7.3 g/dL (ref 6.3–8.2)

## 2017-08-20 LAB — HIV ANTIBODY (ROUTINE TESTING W REFLEX): HIV 1&2 Ab, 4th Generation: NONREACTIVE

## 2017-08-27 ENCOUNTER — Ambulatory Visit (INDEPENDENT_AMBULATORY_CARE_PROVIDER_SITE_OTHER): Payer: 59 | Admitting: Family Medicine

## 2017-08-27 ENCOUNTER — Encounter: Payer: Self-pay | Admitting: Family Medicine

## 2017-08-27 VITALS — BP 113/78 | HR 96 | Temp 98.8°F | Resp 16 | Ht 63.0 in | Wt 142.0 lb

## 2017-08-27 DIAGNOSIS — Z0000 Encounter for general adult medical examination without abnormal findings: Secondary | ICD-10-CM

## 2017-08-27 DIAGNOSIS — Z23 Encounter for immunization: Secondary | ICD-10-CM

## 2017-08-27 DIAGNOSIS — Z789 Other specified health status: Secondary | ICD-10-CM

## 2017-08-27 DIAGNOSIS — F411 Generalized anxiety disorder: Secondary | ICD-10-CM

## 2017-08-27 DIAGNOSIS — Z111 Encounter for screening for respiratory tuberculosis: Secondary | ICD-10-CM

## 2017-08-27 DIAGNOSIS — F410 Panic disorder [episodic paroxysmal anxiety] without agoraphobia: Secondary | ICD-10-CM

## 2017-08-27 NOTE — Patient Instructions (Addendum)
Thank you for coming to the clinic today.  1. Confirm with program that Hep B vaccine series x 3 will work and do not need a lab titer test  2. PPD skin test today - re-check as advised within 72 hours, will offer updated meningitis vaccine at that time  3. Flu Shot today  We will be able to complete rest of test  Please schedule a Follow-up Appointment to: Return in about 1 year (around 08/27/2018) for Annual Physical.  If you have any other questions or concerns, please feel free to call the clinic or send a message through [place]. You may also schedule an earlier appointment if necessary.  Additionally, you may be receiving a survey about your experience at our clinic within a few days to 1 week by e-mail or mail. We value your feedback.  Nobie Putnam, DO [place]

## 2017-08-27 NOTE — Progress Notes (Signed)
Subjective:    Patient ID: Patricia Pearson, female    DOB: 1996/07/13, 21 y.o.   MRN: [id number]  Patricia Pearson is a 21 y.o. female presenting on 08/27/2017 for Annual Exam   HPI   Here for Annual Exam and Lab Review.  She has paperwork today to be completed for her upcoming school program, [employer], as a pre-req before initiating her Clinical Practicum. She states that this program will initiate clinicals in January 2019.  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 Today reports that she is pain free and doing well, previously thought that some of her symptoms were more MSK etiology and she improved with baclofen and conservative therapy Tylenol, NSAID, modify activities. Now off these meds.  FOLLOW-UP Anxiety: - Last visit 04/05/17, for same problem, see note for background information - Today reports doing very well on Lexapro 10mg  daily, tolerating well without side effects - No new stressors. Continues with her college program and working - No further panic attacks since last visit - Denies any sadness, depression or mood disorder, suicidal or homicidal ideation, insomnia, panic  Required by MA Program / Paperwork - Due for PPD skin testing today, prior test was negative years ago. No concerns regarding TB exposure. - Due for repeat meningococcal vaccine, see below  Health Maintenance: - Due for Flu Shot, will receive today  - Due for 2nd dose Meningococcal vaccine - first and only dose of menactra approx age 38, she did not get repeat dose at age 25-18. Now due for one time dose Menactra vs Menveo prior to entering college/program - UTD TDap - UTD all other childhood  vaccinations, completed via [facility] - No known breast cancer or cervical cancer in family, not due for early screening  Depression screen [facility] 2/9 03/04/2017  Decreased Interest 0  Down, Depressed, Hopeless 0  PHQ - 2 Score 0   GAD 7 : Generalized Anxiety Score 04/05/2017 03/04/2017 03/04/2017  Nervous, Anxious, on Edge 1 2 2   Control/stop worrying 1 3 3   Worry too much - different things 1 3 3   Trouble relaxing 0 0 0  Restless 0 0 0  Easily annoyed or irritable 0 0 0  Afraid - awful might happen 0 0 0  Total GAD 7 Score 3 8 8   Anxiety Difficulty Not difficult at all - Somewhat difficult    Past Medical History:  Diagnosis Date  . Anemia   . Anxiety   . Kidney stone    Past Surgical History:  Procedure Laterality Date  . WISDOM TOOTH EXTRACTION  2016   Social History   Social History  . Marital status: Single    Spouse name: N/A  . Number of children: N/A  . Years of education: College   Occupational History  . 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 Smoking status: Never Smoker  . Smokeless tobacco: Never Used  . Alcohol use No  . Drug use: No  . Sexual activity: Not on file   Other Topics Concern  . Not on file   Social History Narrative  . No narrative on file  Family History  Problem Relation Age of Onset  . Basal cell carcinoma Mother   . Anxiety disorder Mother   . Drug abuse Father   . Peptic Ulcer Father   . Heart failure Maternal Grandfather   . Lung cancer Paternal Grandmother   . Colon cancer Neg Hx   . Cervical cancer Neg Hx   . Breast cancer Neg Hx    Current Outpatient Prescriptions on File Prior to Visit  Medication Sig  . ALAYCEN 1/35 tablet Take 1 tablet by mouth daily.  Marland Kitchen escitalopram (LEXAPRO) 10 MG tablet Take 1 tablet (10 mg total) by mouth daily.   No current facility-administered medications on file prior to visit.     Review of Systems   Constitutional: Negative for activity change, appetite change, chills, diaphoresis, fatigue and fever.  HENT: Negative for congestion and hearing loss.   Eyes: Negative for visual disturbance.  Respiratory: Negative for apnea, cough, chest tightness, shortness of breath and wheezing.   Cardiovascular: Negative for chest pain, palpitations and leg swelling.  Gastrointestinal: Negative for abdominal pain, anal bleeding, blood in stool, constipation, diarrhea, nausea and vomiting.  Endocrine: Negative for cold intolerance and polyuria.  Genitourinary: Negative for decreased urine volume, difficulty urinating, dysuria, flank pain (resolved), frequency, hematuria, menstrual problem, pelvic pain and urgency.  Musculoskeletal: Negative for arthralgias and neck pain.  Skin: Negative for rash.  Neurological: Negative for dizziness, weakness, light-headedness, numbness and headaches.  Hematological: Negative for adenopathy.  Psychiatric/Behavioral: Negative for behavioral problems, dysphoric mood, self-injury, sleep disturbance and suicidal ideas. The patient is not nervous/anxious (controlled on medicine).    Per HPI unless specifically indicated above     Objective:    BP 113/78   Pulse 96   Temp 98.8 F (37.1 C) (Oral)   Resp 16   Ht 5\' 3"  (1.6 m)   Wt 142 lb (64.4 kg)   BMI 25.15 kg/m   Wt Readings from Last 3 Encounters:  08/27/17 142 lb (64.4 kg) (74 %, Z= 0.63)*  07/03/17 135 lb (61.2 kg) (65 %, Z= 0.39)*  06/11/17 135 lb (61.2 kg) (65 %, Z= 0.39)*   * Growth percentiles are based on CDC 2-20 Years data.    Physical Exam  Constitutional: She is oriented to person, place, and time. She appears well-developed and well-nourished. No distress.  Well-appearing, comfortable, cooperative  HENT:  Head: Normocephalic and atraumatic.  Mouth/Throat: Oropharynx is clear and moist.  Frontal / maxillary sinuses non-tender. Nares patent without purulence or edema. Bilateral TMs clear without  erythema, effusion or bulging. Oropharynx clear without erythema, exudates, edema or asymmetry.  Conversational hearing intact up to 15 ft  Eyes: Pupils are equal, round, and reactive to light. Conjunctivae and EOM are normal. Right eye exhibits no discharge. Left eye exhibits no discharge.  Neck: Normal range of motion. Neck supple. No thyromegaly present.  Cardiovascular: Normal rate, regular rhythm, normal heart sounds and intact distal pulses.   No murmur heard. Pulmonary/Chest: Effort normal and breath sounds normal. No respiratory distress. She has no wheezes. She has no rales.  Abdominal: Soft. Bowel sounds are normal. She exhibits no distension and no mass. There is no tenderness.  Musculoskeletal: Normal range of motion. She exhibits no edema or tenderness.  Upper / Lower Extremities: - Normal muscle tone, strength bilateral upper extremities 5/5, lower extremities 5/5  Lymphadenopathy:    She has no cervical adenopathy.  Neurological: She is alert and oriented to person, place, and time.  Distal sensation intact  to light touch all extremities  Skin: Skin is warm and dry. No rash noted. She is not diaphoretic. No erythema.  Psychiatric: She has a normal mood and affect. Her behavior is normal.  Well groomed, good eye contact, normal speech and thoughts  Nursing note and vitals reviewed.  Results for orders placed or performed in visit on 08/19/17  COMPLETE METABOLIC PANEL WITH GFR  Result Value Ref Range   Glucose, Bld 85 65 - 99 mg/dL   BUN 13 7 - 20 mg/dL   Creat 0.71 0.50 - 1.00 mg/dL   GFR, Est Non African American 123 > OR = 60 mL/min/1.45m2   GFR, Est African American 143 > OR = 60 mL/min/1.73m2   BUN/Creatinine Ratio NOT APPLICABLE 6 - 22 (calc)   Sodium 139 135 - 146 mmol/L   Potassium 4.1 3.8 - 5.1 mmol/L   Chloride 106 98 - 110 mmol/L   CO2 24 20 - 32 mmol/L   Calcium 9.1 8.9 - 10.4 mg/dL   Total Protein 7.3 6.3 - 8.2 g/dL   Albumin 4.2 3.6 - 5.1 g/dL    Globulin 3.1 2.0 - 3.8 g/dL (calc)   AG Ratio 1.4 1.0 - 2.5 (calc)   Total Bilirubin 0.4 0.2 - 1.1 mg/dL   Alkaline phosphatase (APISO) 48 47 - 176 U/L   AST 17 12 - 32 U/L   ALT 18 5 - 32 U/L  CBC with Differential/Platelet  Result Value Ref Range   WBC 5.4 3.8 - 10.8 Thousand/uL   RBC 4.39 3.80 - 5.10 Million/uL   Hemoglobin 13.6 11.7 - 15.5 g/dL   HCT 39.8 35.0 - 45.0 %   MCV 90.7 80.0 - 100.0 fL   MCH 31.0 27.0 - 33.0 pg   MCHC 34.2 32.0 - 36.0 g/dL   RDW 11.5 11.0 - 15.0 %   Platelets 189 140 - 400 Thousand/uL   MPV 12.3 7.5 - 12.5 fL   Neutro Abs 2,948 1,500 - 7,800 cells/uL   Lymphs Abs 1,885 850 - 3,900 cells/uL   WBC mixed population 443 200 - 950 cells/uL   Eosinophils Absolute 103 15 - 500 cells/uL   Basophils Absolute 22 0 - 200 cells/uL   Neutrophils Relative % 54.6 %   Total Lymphocyte 34.9 %   Monocytes Relative 8.2 %   Eosinophils Relative 1.9 %   Basophils Relative 0.4 %  HIV antibody  Result Value Ref Range   HIV 1&2 Ab, 4th Generation NON-REACTIVE NON-REACTI      Assessment & Plan:   Problem List Items Addressed This Visit    Generalized anxiety disorder with panic attacks    Remains significantly well controlled GAD on SSRI No further panic attacks Continue Lexapro 10mg  daily, no dose adjust at this time Follow-up yearly for annual, unless need follow-up sooner       Other Visit Diagnoses    Annual physical exam    -  Primary   Flu shot today, otherwise UTD, labs unremarkable   Needs flu shot       Relevant Orders   Flu Vaccine QUAD 36+ mos IM (Completed)   PPD screening test       Return for PPD within 72 hours for nurse to read   Relevant Orders   PPD (Completed)   Meningococcal vaccination not up to date       Due for 2nd dose menactra vs menveo, will receive in follow-up this week      No orders of the  defined types were placed in this encounter.  Follow up plan: Return in about 1 year (around 08/27/2018) for Annual  Physical.  Nobie Putnam, DO [facility] 08/27/2017, 11:27 PM

## 2017-08-27 NOTE — Assessment & Plan Note (Signed)
Remains significantly well controlled GAD on SSRI No further panic attacks Continue Lexapro 10mg  daily, no dose adjust at this time Follow-up yearly for annual, unless need follow-up sooner

## 2017-08-29 ENCOUNTER — Ambulatory Visit (INDEPENDENT_AMBULATORY_CARE_PROVIDER_SITE_OTHER): Payer: 59

## 2017-08-29 DIAGNOSIS — Z23 Encounter for immunization: Secondary | ICD-10-CM | POA: Diagnosis not present

## 2017-08-30 LAB — TB SKIN TEST: TB SKIN TEST: NEGATIVE

## 2017-10-01 ENCOUNTER — Other Ambulatory Visit: Payer: 59

## 2017-10-08 ENCOUNTER — Encounter: Payer: 59 | Admitting: Family Medicine

## 2018-03-17 ENCOUNTER — Other Ambulatory Visit: Payer: Self-pay | Admitting: Family Medicine

## 2018-03-17 DIAGNOSIS — Z30018 Encounter for initial prescription of other contraceptives: Secondary | ICD-10-CM

## 2018-03-17 MED ORDER — ALYACEN 1/35 1-35 MG-MCG PO TABS: 1.0000 | ORAL_TABLET | Freq: Every day | ORAL | 5 refills | Status: DC

## 2018-03-17 NOTE — Telephone Encounter (Signed)
Pt called requesting  A refill on  Patricia Pearson called into [facility] in [place]

## 2018-03-17 NOTE — Telephone Encounter (Signed)
Rx send for approval. 

## 2018-04-08 ENCOUNTER — Encounter: Payer: Self-pay | Admitting: Family Medicine

## 2018-04-08 ENCOUNTER — Ambulatory Visit (INDEPENDENT_AMBULATORY_CARE_PROVIDER_SITE_OTHER): Payer: 59 | Admitting: Family Medicine

## 2018-04-08 VITALS — BP 128/82 | HR 87 | Temp 98.5°F | Resp 16 | Ht 63.0 in | Wt 152.0 lb

## 2018-04-08 DIAGNOSIS — N912 Amenorrhea, unspecified: Secondary | ICD-10-CM | POA: Diagnosis not present

## 2018-04-08 LAB — POCT URINE PREGNANCY: Preg Test, Ur: NEGATIVE

## 2018-04-08 NOTE — Patient Instructions (Addendum)
Thank you for coming to the office today.  Recommend starting Prenatal Vitamin daily supplement  Referral sent to OBGYN to discuss pre pregnancy counseling and also review missed periods  [facility]   Address: [address] [place], [place], [place], [place] [address] Hours: 8AM-5PM Phone: [phone]  Urine Pregnancy test was NEGATIVE  Check blood test today - to confirm  Please schedule a Follow-up Appointment to: Return if symptoms worsen or fail to improve, for amenorrhea.  If you have any other questions or concerns, please feel free to call the office or send a message through [place]. You may also schedule an earlier appointment if necessary.  Additionally, you may be receiving a survey about your experience at our office within a few days to 1 week by e-mail or mail. We value your feedback.  Nobie Putnam, DO [place]

## 2018-04-08 NOTE — Addendum Note (Signed)
Addended by: Olin Hauser on: 04/08/2018 01:18 PM   Modules accepted: Orders

## 2018-04-08 NOTE — Progress Notes (Addendum)
Subjective:    Patient ID: Patricia Pearson, female    DOB: 05/24/1996, 22 y.o.   MRN: [id number]  Patricia Pearson is a 22 y.o. female presenting on 04/08/2018 for Amenorrhea (missed from past 2 months)  Patient presents for a same day appointment.  HPI   AMENORRHEA Reports that her last menstrual period was April 16 (1st day of last period), she previously was taking OCP Alaycen, then she stopped taking this after placebo pill week in April, and has not resumed it as she is interested to get pregnant currently. - She checked Urine Pregnancy home test multiple times, all negative. - Now 2.5 months off OCP, still without period. - Admitted x 2 episodes of nausea vomiting 2 days ago in evening episode, and then next day in AM she ate a biscuit and had a nausea vomiting episode again then it resolved, no further episodes of this, no active nausea. No known sick contacts - She has stopped taking Lexapro 10mg  daily, tapered down with half pill for 1 week in April then stopped. - Sexually active, one partner, not using condoms, she is interested to get pregnant, not established with OBGYN yet - She is not taking prenatal vitamin, she is not consuming alcohol Some weight gain 10 lbs in 8 months, not attributed recently Denies abdominal pain, nausea vomiting actively, diarrhea, spotting or abnormal menstrual bleeding, cramping   Depression screen [facility] 2/9 04/08/2018 03/04/2017  Decreased Interest 0 0  Down, Depressed, Hopeless 0 0  PHQ - 2 Score 0 0    Social History   Tobacco Use  . Smoking status: Never Smoker  . Smokeless tobacco: Never Used  Substance Use Topics  . Alcohol use: No  . Drug use: No    Review of Systems Per HPI unless specifically indicated above     Objective:    BP 128/82   Pulse 87   Temp 98.5 F (36.9 C) (Oral)   Resp 16   Ht 5\' 3"  (1.6 m)   Wt 152 lb (68.9 kg)   LMP 02/18/2018   BMI 26.93 kg/m   Wt Readings from Last 3 Encounters:  04/08/18 152  lb (68.9 kg) (82 %, Z= 0.91)*  08/27/17 142 lb (64.4 kg) (74 %, Z= 0.63)*  07/03/17 135 lb (61.2 kg) (65 %, Z= 0.39)*   * Growth percentiles are based on CDC (Girls, 2-20 Years) data.    Physical Exam  Constitutional: She is oriented to person, place, and time. She appears well-developed and well-nourished. No distress.  Well-appearing, comfortable, cooperative  HENT:  Head: Normocephalic and atraumatic.  Mouth/Throat: Oropharynx is clear and moist.  Eyes: Conjunctivae are normal. Right eye exhibits no discharge. Left eye exhibits no discharge.  Cardiovascular: Normal rate.  Pulmonary/Chest: Effort normal.  Abdominal: Soft. She exhibits no distension. There is no tenderness.  Musculoskeletal: She exhibits no edema.  Neurological: She is alert and oriented to person, place, and time.  Skin: Skin is warm and dry. No rash noted. She is not diaphoretic. No erythema.  Psychiatric: She has a normal mood and affect. Her behavior is normal.  Well groomed, good eye contact, normal speech and thoughts  Nursing note and vitals reviewed.  Results for orders placed or performed in visit on 04/08/18  POCT urine pregnancy  Result Value Ref Range   Preg Test, Ur Negative Negative    Results for orders placed or performed in visit on 04/08/18 (from the past 72 hour(s))  POCT urine pregnancy  Status: Normal   Collection Time: 04/08/18 11:22 AM  Result Value Ref Range   Preg Test, Ur Negative Negative  B-HCG Quant     Status: None   Collection Time: 04/08/18 11:52 AM  Result Value Ref Range   HCG, Total, QN <2 mIU/mL    Comment: Gestational Age   Expected hCG values (mIU/mL) <1 Week:                 5-50 1-2 Weeks:               50-500 2-3 Weeks:               [phone] 3-4 Weeks:               500-10000 4-5 Weeks:               1000-50000 5-6 Weeks:               10000-100000 6-8 Weeks:               15000-200000 2-3 Months:              10000-100000 The table above provides only a  very rough estimate of gestational age and should be used only in conjunction with other methods for establishing gestational age. Much more reliable and accurate estimations of gestational age may be obtained by using LMP or ultrasound. . . Values from different assay methods may vary. The use of this assay to monitor or to diagnose  patients with cancer or any condition unrelated to pregnancy has not been cleared or approved by the FDA or the manufacturer of the assay.        Assessment & Plan:   Problem List Items Addressed This Visit    None    Visit Diagnoses    Amenorrhea    -  Primary   Relevant Orders   POCT urine pregnancy (Completed)   B-HCG Quant      Uncertain exact etiology of amenorrhea, most likely may be due to stopping OCP recently within past 3 months, however no breakthrough bleeding or spotting even. She is high risk for pregnancy given sexually active w/o condom and seems to be actively trying to get pregnant. Mostly asymptomatic except recent episode of nausea vomiting x 1-2 episodes in evening then next morning, no other characteristic symptoms of pregnancy. - Urine pregnancy negative at home  [place] today - negative result Discussion on options - advised we can check blood serum beta hcg to confirm today given her duration amenorrhea and recent symptoms. Proceed with lab today - follow-up result - Ultimately, regardless of positive or negative result, will refer to [facility] for further follow-up on this issue, either Pre-Pregnancy Counseling or for Pregnancy Management  Advised her to start Prenatal Vitamins daily Remain off Lexapro and OCP as planned  ----------------  UPDATE 04/09/18 Beta-HCG lab result is negative < 2, not consistent with pregnancy at this time.  Regardless, referral placed as requested to [place] for pre-conception counseling and further management of amenorrhea.  No orders of the defined types were  placed in this encounter.  Orders Placed This Encounter  Procedures  . B-HCG Quant  . Ambulatory referral to Obstetrics / Gynecology    Referral Priority:   Routine    Referral Type:   Consultation    Referral Reason:   Specialty Services Required    Requested Specialty:   Obstetrics and Gynecology  Number of Visits Requested:   1  . POCT urine pregnancy    Follow up plan: Return if symptoms worsen or fail to improve, for amenorrhea.   Pending placement of referral to [place] - until after Lab b-HCG serum is resulted, that was drawn today.  Nobie Putnam, [facility] 04/08/2018, 1:06 PM

## 2018-04-09 LAB — HCG, QUANTITATIVE, PREGNANCY: HCG, Total, QN: <2 m[IU]/mL

## 2018-04-09 NOTE — Addendum Note (Signed)
Addended by: Olin Hauser on: 04/09/2018 01:08 PM   Modules accepted: Orders

## 2018-04-10 ENCOUNTER — Telehealth: Payer: Self-pay | Admitting: Obstetrics & Gynecology

## 2018-04-10 NOTE — Telephone Encounter (Signed)
[facility] referring for Amenorrhea. Attempt to reach patient voicemail box is full

## 2018-04-14 NOTE — Telephone Encounter (Signed)
Attempt to reach patient voicemail box is full

## 2018-04-18 NOTE — Telephone Encounter (Signed)
Attempt to reach patient voicemail box is full

## 2018-04-18 NOTE — Telephone Encounter (Signed)
Letter sent to PCP due to unable to reach patient to schedule appointment

## 2018-06-02 ENCOUNTER — Encounter: Payer: Self-pay | Admitting: Family Medicine

## 2018-06-02 ENCOUNTER — Ambulatory Visit (INDEPENDENT_AMBULATORY_CARE_PROVIDER_SITE_OTHER): Payer: 59 | Admitting: Family Medicine

## 2018-06-02 VITALS — BP 120/83 | HR 75 | Temp 98.7°F | Resp 16 | Ht 63.0 in | Wt 150.6 lb

## 2018-06-02 DIAGNOSIS — N390 Urinary tract infection, site not specified: Secondary | ICD-10-CM

## 2018-06-02 LAB — POCT URINALYSIS DIPSTICK
Bilirubin, UA: NEGATIVE
Glucose, UA: NEGATIVE
Ketones, UA: NEGATIVE
NITRITE UA: NEGATIVE
PH UA: 6 (ref 5.0–8.0)
PROTEIN UA: NEGATIVE
RBC UA: NEGATIVE
SPEC GRAV UA: 1.01 (ref 1.010–1.025)
Urobilinogen, UA: 0.2 E.U./dL

## 2018-06-02 MED ORDER — CEPHALEXIN 500 MG PO CAPS: 500.0000 mg | ORAL_CAPSULE | Freq: Three times a day (TID) | ORAL | 0 refills | Status: DC

## 2018-06-02 NOTE — Patient Instructions (Addendum)
Thank you for coming to the office today.  1. You have a Urinary Tract Infection - this is very common, your symptoms are reassuring and you should get better within 1 week on the antibiotics - Start Keflex 500mg  3 times daily for next 7 days, complete entire course, even if feeling better - We sent urine for a culture, we will call you within next few days if we need to change antibiotics - Please drink plenty of fluids, improve hydration over next 1 week  Future consider AZO OTC for 1-2 days for burning.  If symptoms worsening, developing nausea / vomiting, worsening back pain, fevers / chills / sweats, then please return for re-evaluation sooner.  To prevent bladder and kidney infections...  Wipe front to back after using the restroom  Drink enough water to keep your pee clear to pale yellow  If you think you are getting another bladder infection, start drinking cranberry juice and come see [place] so we can check the urine.  Please schedule a Follow-up Appointment to: Return in about 1 week (around 06/09/2018), or if symptoms worsen or fail to improve, for UTI if not improved.  If you have any other questions or concerns, please feel free to call the office or send a message through [place]. You may also schedule an earlier appointment if necessary.  Additionally, you may be receiving a survey about your experience at our office within a few days to 1 week by e-mail or mail. We value your feedback.  Nobie Putnam, DO [place]

## 2018-06-02 NOTE — Progress Notes (Signed)
Subjective:    Patient ID: Patricia Pearson, female    DOB: Apr 27, 1996, 22 y.o.   MRN: [id number]  Patricia Pearson is a 22 y.o. female presenting on 06/02/2018 for Urinary Tract Infection  Patient presents for a same day appointment.  HPI   UTI Reports symptoms started about 3 days ago with dysuria and urinary frequency, gradual worsening with increased dysuria with burning on each urination. - Previous history of recurrent UTIs in past few years ago around age 73 when she had onset of menstrual cycle, she used to get frequent UTI. Also has history of nephrolithiasis, but has not endorsed pain with this current episode. - Denies any fevers chills, nausea vomiting, flank or abdominal pain, urine color or odor changes   Depression screen [facility] 2/9 06/02/2018 04/08/2018 03/04/2017  Decreased Interest 0 0 0  Down, Depressed, Hopeless 0 0 0  PHQ - 2 Score 0 0 0    Social History   Tobacco Use  . Smoking status: Never Smoker  . Smokeless tobacco: Never Used  Substance Use Topics  . Alcohol use: No  . Drug use: No    Review of Systems Per HPI unless specifically indicated above     Objective:    BP 120/83   Pulse 75   Temp 98.7 F (37.1 C) (Oral)   Resp 16   Ht 5\' 3"  (1.6 m)   Wt 150 lb 9.6 oz (68.3 kg)   BMI 26.68 kg/m   Wt Readings from Last 3 Encounters:  06/02/18 150 lb 9.6 oz (68.3 kg) (80 %, Z= 0.86)*  04/08/18 152 lb (68.9 kg) (82 %, Z= 0.91)*  08/27/17 142 lb (64.4 kg) (74 %, Z= 0.63)*   * Growth percentiles are based on CDC (Girls, 2-20 Years) data.    Physical Exam  Constitutional: She is oriented to person, place, and time. She appears well-developed and well-nourished. No distress.  Well-appearing, comfortable, cooperative  HENT:  Head: Normocephalic and atraumatic.  Mouth/Throat: Oropharynx is clear and moist.  Eyes: Conjunctivae are normal. Right eye exhibits no discharge. Left eye exhibits no discharge.  Cardiovascular: Normal rate.    Pulmonary/Chest: Effort normal.  Abdominal: Soft. She exhibits no distension. There is no tenderness.  Musculoskeletal: She exhibits no edema.  Neurological: She is alert and oriented to person, place, and time.  Skin: Skin is warm and dry. No rash noted. She is not diaphoretic. No erythema.  Psychiatric: She has a normal mood and affect. Her behavior is normal.  Well groomed, good eye contact, normal speech and thoughts  Nursing note and vitals reviewed.    Results for orders placed or performed in visit on 06/02/18  POCT Urinalysis Dipstick  Result Value Ref Range   Color, UA amber    Clarity, UA cloudy    Glucose, UA Negative Negative   Bilirubin, UA Negative    Ketones, UA Negative    Spec Grav, UA 1.010 1.010 - 1.025   Blood, UA Negative    pH, UA 6.0 5.0 - 8.0   Protein, UA Negative Negative   Urobilinogen, UA 0.2 0.2 or 1.0 E.U./dL   Nitrite, UA Negative    Leukocytes, UA Trace (A) Negative   Appearance cloudy    Odor none       Assessment & Plan:   Problem List Items Addressed This Visit    None    Visit Diagnoses    Urinary tract infection without hematuria, site unspecified    -  Primary  Relevant Medications   cephALEXin (KEFLEX) 500 MG capsule   Other Relevant Orders   POCT Urinalysis Dipstick (Completed)   Urine Culture      Clinically consistent with UTI and possible on UA. No recent UTIs or abx courses. No other complications but history of prior recurrent UTI years ago related to menstrual cycle. Denies any clinical risk of other potential infections such as STD No concern for pyelo today (no systemic symptoms, neg fever, back pain, n/v).  Plan: 1. Ordered Urine culture 2. Keflex 500mg  TID x 7 days 3. Improve PO hydration 4. RTC if no improvement 1-2 weeks, red flags given to return sooner   Meds ordered this encounter  Medications  . cephALEXin (KEFLEX) 500 MG capsule    Sig: Take 1 capsule (500 mg total) by mouth 3 (three) times daily. For  7 days    Dispense:  21 capsule    Refill:  0    Follow up plan: Return in about 1 week (around 06/09/2018), or if symptoms worsen or fail to improve, for UTI if not improved.  Nobie Putnam, [facility] 06/02/2018, 12:34 PM

## 2018-06-04 ENCOUNTER — Telehealth: Payer: Self-pay | Admitting: Family Medicine

## 2018-06-04 DIAGNOSIS — N390 Urinary tract infection, site not specified: Secondary | ICD-10-CM

## 2018-06-04 LAB — URINE CULTURE
MICRO NUMBER: 90895167
SPECIMEN QUALITY:: ADEQUATE

## 2018-06-04 MED ORDER — CIPROFLOXACIN HCL 500 MG PO TABS: 500.0000 mg | ORAL_TABLET | Freq: Two times a day (BID) | ORAL | 0 refills | Status: DC

## 2018-06-04 NOTE — Telephone Encounter (Signed)
Unable to reach patient or Mom who's on DPR or leave msg.

## 2018-06-04 NOTE — Telephone Encounter (Signed)
Please try to contact patient:  I attempted to call but was unable to reach you or leave msg. Call or respond if you have questions.  Released to mychart. Here is copy of your Urine Culture result.  This is a more unlikely bacteria, but still fairly common. It does show some resistances to the penicillin based antibiotics.  It is sensitive to Cipro - we will actually SWITCH antibiotics from Keflex.  STOP Keflex  START Cipro 500mg  twice daily for 5 days. While taking Cipro - please be careful with high exertion exercises to avoid any injury to ligaments or tendons, that is very rare side effect.  Nobie Putnam, [facility] 06/04/2018, 1:04 PM

## 2018-06-05 ENCOUNTER — Telehealth: Payer: Self-pay

## 2018-06-05 DIAGNOSIS — B379 Candidiasis, unspecified: Secondary | ICD-10-CM

## 2018-06-05 DIAGNOSIS — T3695XA Adverse effect of unspecified systemic antibiotic, initial encounter: Principal | ICD-10-CM

## 2018-06-05 NOTE — Telephone Encounter (Signed)
Patient called back today started Keflex today on 06/05/2018 but has side effect of itchiness from Cipro. Patient had tried OTC vagisil didn't help. Please suggest ?

## 2018-06-05 NOTE — Telephone Encounter (Signed)
Can you clarify - she started Keflex or the Cipro on 06/05/18?  If it is Cipro - would need to know if she is having generalized itching like whole body - arms/legs. And if there is any rash?  Otherwise, if it is vaginal itching or any discharge, she may have a Yeast Infection as well. If this is the case - we can send a yeast antibiotic to take AFTER finishing Cipro course.  Nobie Putnam, [facility] 06/05/2018, 4:55 PM

## 2018-06-06 MED ORDER — FLUCONAZOLE 150 MG PO TABS: ORAL_TABLET | ORAL | 0 refills | Status: DC

## 2018-06-06 NOTE — Telephone Encounter (Signed)
Likely Yeast infection triggered by the current antibiotics.  Sent rx Diflucan to pharmacy.  She may finish Cipro and then start Diflucan.  Nobie Putnam, DO [facility] 06/06/2018, 10:29 AM

## 2018-06-06 NOTE — Telephone Encounter (Signed)
Called patient back and she reported that she took the Keflex: Monday, Tuesday and Wednesday.  She started taking Cipro yesterday as directed.  She states that she is still have vaginal itching.

## 2018-06-06 NOTE — Addendum Note (Signed)
Addended by: Olin Hauser on: 06/06/2018 10:29 AM   Modules accepted: Orders

## 2018-08-20 ENCOUNTER — Ambulatory Visit (INDEPENDENT_AMBULATORY_CARE_PROVIDER_SITE_OTHER): Payer: 59 | Admitting: Family Medicine

## 2018-08-20 ENCOUNTER — Encounter: Payer: Self-pay | Admitting: Family Medicine

## 2018-08-20 VITALS — BP 117/86 | HR 88 | Temp 98.4°F | Resp 16 | Ht 63.0 in | Wt 152.8 lb

## 2018-08-20 DIAGNOSIS — J029 Acute pharyngitis, unspecified: Secondary | ICD-10-CM

## 2018-08-20 DIAGNOSIS — J028 Acute pharyngitis due to other specified organisms: Secondary | ICD-10-CM

## 2018-08-20 DIAGNOSIS — B9689 Other specified bacterial agents as the cause of diseases classified elsewhere: Secondary | ICD-10-CM

## 2018-08-20 LAB — POCT RAPID STREP A (OFFICE): Rapid Strep A Screen: NEGATIVE

## 2018-08-20 MED ORDER — AMOXICILLIN 500 MG PO CAPS: 500.0000 mg | ORAL_CAPSULE | Freq: Two times a day (BID) | ORAL | 0 refills | Status: DC

## 2018-08-20 NOTE — Patient Instructions (Addendum)
Thank you for coming to the office today.  You have most likely a bacterial Throat Pharyngitis, rapid strep swab test was NEGATIVE today  Also unlikely tick borne or lyme but just incase we can cover with antibiotic  We will cover you for Strep Throat with antibiotics, take Amoxicillin 500mg  twice a day for 10 days. We will send swab for a culture, and notify you next week if we need to change antibiotics.  - Take regular NSAID with either Aleve 1-2 pills twice a day OR Ibuprofen 400 to 600mg  per dose with food every 6-8 hours or 3 times a day for next 3 to 5 days regularly, then as needed only - Take Tylenol 500-1000mg  per dose as needed every 8 hours or 3 times a day between for pain, fevers, or chills - Drink extra clear fluids (water, or G2 gatorade), try colder soft foods if needed otherwise regular diet - Drink warm herbal tea with honey for sore throat  May try OTC Mucinex (or may try Mucinex-DM for cough) up to 7-10 days then stop  There is a chance that this is more of a Viral Pharyngitis, in which case it will take time to run it's course regardless, and may have some hoarseness or throat pain lasting for up to 7-10 days then improve  Take Loratadine (generic Claritin) 10mg  daily for prevention of hives / allergy OR can take one every day when you get a flare up until it resolves.  Please schedule a follow-up appointment with Dr. Parks Ranger in 1 week if any worsening or change in symptoms Sore Throat  If you have any other questions or concerns, please feel free to call the office or send a message through [place]. You may also schedule an earlier appointment if necessary.  Additionally, you may be receiving a survey about your experience at our office within a few days to 1 week by e-mail or mail. We value your feedback.  Nobie Putnam, DO [facility]

## 2018-08-20 NOTE — Progress Notes (Signed)
Subjective:    Patient ID: Patricia Pearson, female    DOB: 06/24/1996, 21 y.o.   MRN: [id number]  Patricia Pearson is a 22 y.o. female presenting on 08/20/2018 for Sore Throat (chills, HA not sure about fever onset 3 days)  Patient presents for a same day appointment.  HPI   PHARYNGITIS Reports symptoms started Monday, about 3 days ago with initial symptom onset sore / scratchy throat, then developed occasional chills at times, and headache, did not have significant sinus congestion or pressure, she admits some chest congestion - No known sick contacts - Taking cough drops and Advil 200mg  x 2 per dose 2-3 times a day, improves headache and helps control chills. Last dose Advil - 8am today - Admits persistent diarrhea loose stool 3-4 x a day for past few days, admits nausea vomiting x 1 episode only - worse after meal, did not eat today yet - She does admit had a tick found embedded on left upper back on Saturday after being outdoors most of day hunting, and did not have any pain or rash or problem from this but wanted to mention - Denies any abdominal pain or cramping, dark stool or blood in stool, rash  Additional complaint: - Recurrent Urticaria - Episodes of hives, for past 2 years, saw Dermatology in past, they advised benadryl PRN. She does not know exact trigger, says benadryl makes her too sleepy. None actively  Health Maintenance: Due for Flu Shot, will defer today due to illness  Depression screen [facility] 2/9 06/02/2018 04/08/2018 03/04/2017  Decreased Interest 0 0 0  Down, Depressed, Hopeless 0 0 0  PHQ - 2 Score 0 0 0    Social History   Tobacco Use  . Smoking status: Never Smoker  . Smokeless tobacco: Never Used  Substance Use Topics  . Alcohol use: No  . Drug use: No    Review of Systems Per HPI unless specifically indicated above     Objective:    BP 117/86   Pulse 88   Temp 98.4 F (36.9 C) (Oral)   Resp 16   Ht 5\' 3"  (1.6 m)   Wt 152 lb 12.8 oz (69.3  kg)   SpO2 100%   BMI 27.07 kg/m   Wt Readings from Last 3 Encounters:  08/20/18 152 lb 12.8 oz (69.3 kg)  06/02/18 150 lb 9.6 oz (68.3 kg) (80 %, Z= 0.86)*  04/08/18 152 lb (68.9 kg) (82 %, Z= 0.91)*   * Growth percentiles are based on CDC (Girls, 2-20 Years) data.    Physical Exam  Constitutional: She is oriented to person, place, and time. She appears well-developed and well-nourished. No distress.  Well-appearing, comfortable, cooperative  HENT:  Head: Normocephalic and atraumatic.  Frontal / maxillary sinuses non-tender. Nares patent without purulence or edema. Bilateral TMs clear without erythema, effusion or bulging. Oropharynx with pharyngeal erythema and edema and tonsillar exudates, without asymmetry.  Eyes: Conjunctivae are normal. Right eye exhibits no discharge. Left eye exhibits no discharge.  Neck: Normal range of motion. Neck supple. No thyromegaly present.  Cardiovascular: Normal rate, regular rhythm, normal heart sounds and intact distal pulses.  No murmur heard. Pulmonary/Chest: Effort normal and breath sounds normal. No respiratory distress. She has no wheezes. She has no rales.  Abdominal: Soft. She exhibits no distension. There is no tenderness. There is no guarding.  Musculoskeletal: Normal range of motion. She exhibits no edema.  Lymphadenopathy:    She has cervical adenopathy (Right upper anterior  cervical mild tender, no posterior LAD).  Neurological: She is alert and oriented to person, place, and time.  Skin: Skin is warm and dry. No rash (Upper Left back without erythema migrans or rash at site of tick bite) noted. She is not diaphoretic. No erythema.  Psychiatric: She has a normal mood and affect. Her behavior is normal.  Well groomed, good eye contact, normal speech and thoughts  Nursing note and vitals reviewed.  Results for orders placed or performed in visit on 08/20/18  POCT rapid strep A  Result Value Ref Range   Rapid Strep A Screen Negative  Negative      Assessment & Plan:   Problem List Items Addressed This Visit    None    Visit Diagnoses    Bacterial pharyngitis    -  Primary   Relevant Medications   amoxicillin (AMOXIL) 500 MG capsule   Other Relevant Orders   Culture, Group A Strep   Sore throat       Relevant Orders   POCT rapid strep A (Completed)   Culture, Group A Strep      Consistent with acute pharyngitis, concern for possible strep vs bacterial source given symptoms lack of cough, tender anterior cervical LAD, appearance of posterior pharynx. No known strep or sick contacts. Possible viral pharyngitis. No other localized infection, TMs clear. Able to tolerate PO. Centor Score 3. - Also question if tick bite recently can affect her current symptoms, does not seem classic, and no rash or bull's eye or erythema migrans on her back at bite site, less than 12 hours attached as well unlikely  Plan: 1. Rapid strep negative today - still have high suspicion for strep 2. Proceed with Group A strep culture - follow-up 3. Start empiric antibiotic with Amoxicillin 500mg  BID x 10 days, will adjust based on improvement and culture results 4. Symptomatic control with NSAID / Tylenol regularly then PRN 5. Improve hydration, advance diet, warm herbal tea with honey - Try mucinex to clear 6. Follow-up within 1 week if worsening  #History of Recurrent Urticaria, unknown etiology - Recommend trial of 2nd gen antihistamine loratadine daily to prevent or PRN use in future, instead of benadryl  Meds ordered this encounter  Medications  . amoxicillin (AMOXIL) 500 MG capsule    Sig: Take 1 capsule (500 mg total) by mouth 2 (two) times daily. For 10 days    Dispense:  20 capsule    Refill:  0    Follow up plan: Return in about 1 week (around 08/27/2018), or if symptoms worsen or fail to improve, for sore throat pharyngitis.  Nobie Putnam, DO [facility] 08/20/2018,  12:59 PM

## 2018-08-22 LAB — CULTURE, GROUP A STREP
MICRO NUMBER: 91244254
SPECIMEN QUALITY:: ADEQUATE

## 2018-10-22 ENCOUNTER — Ambulatory Visit (INDEPENDENT_AMBULATORY_CARE_PROVIDER_SITE_OTHER): Payer: 59 | Admitting: Family Medicine

## 2018-10-22 ENCOUNTER — Encounter: Payer: Self-pay | Admitting: Family Medicine

## 2018-10-22 VITALS — BP 130/65 | HR 99 | Temp 99.6°F | Resp 16 | Ht 63.0 in | Wt 153.6 lb

## 2018-10-22 DIAGNOSIS — Z3201 Encounter for pregnancy test, result positive: Secondary | ICD-10-CM | POA: Diagnosis not present

## 2018-10-22 DIAGNOSIS — R102 Pelvic and perineal pain: Secondary | ICD-10-CM

## 2018-10-22 LAB — POCT URINE PREGNANCY: PREG TEST UR: POSITIVE — AB

## 2018-10-22 NOTE — Progress Notes (Signed)
Subjective:    Patient ID: Patricia Pearson, female    DOB: 1996/06/04, 22 y.o.   MRN: [id number]  Patricia Pearson is a 22 y.o. female presenting on 10/22/2018 for positive pregnancy test (LMP 09/22/2018)   HPI   PREGNANCY Patient here today after home urine pregnancy test positive x 3, checked yesterday. Patient's last menstrual period was 09/22/2018 (exact date). She has history of amenorrhea in past. Did not follow-up with GYN at that time in 04/2018 her period has resumed since. She remains sexually active and this was an expected pregnancy. - Today she would like referral to OBGYN. She has been seen by [facility] back in 2016. She would like to return - Additional complaints, she states today temperature is 99.32F, she did not feel warm or feverish. Has not been sick recently. - Admits mild pelvic cramping, mild episodic, similar to period pains - Denies any spotting bleeding, nausea vomiting  History of anemia Started prenatal vitamin yesterday. Asking about future lab check for anemia. Last normal in 2018.   Depression screen [facility] 2/9 10/22/2018 06/02/2018 04/08/2018  Decreased Interest 0 0 0  Down, Depressed, Hopeless 0 0 0  PHQ - 2 Score 0 0 0    Social History   Tobacco Use  . Smoking status: Never Smoker  . Smokeless tobacco: Never Used  Substance Use Topics  . Alcohol use: No  . Drug use: No    Review of Systems Per HPI unless specifically indicated above     Objective:    BP 130/65   Pulse 99   Temp 99.6 F (37.6 C) (Oral)   Resp 16   Ht 5\' 3"  (1.6 m)   Wt 153 lb 9.6 oz (69.7 kg)   LMP 09/22/2018 (Exact Date)   BMI 27.21 kg/m   Wt Readings from Last 3 Encounters:  10/22/18 153 lb 9.6 oz (69.7 kg)  08/20/18 152 lb 12.8 oz (69.3 kg)  06/02/18 150 lb 9.6 oz (68.3 kg) (80 %, Z= 0.86)*   * Growth percentiles are based on CDC (Girls, 2-20 Years) data.    Physical Exam Vitals signs and nursing note reviewed.  Constitutional:      General: She  is not in acute distress.    Appearance: She is well-developed. She is not diaphoretic.     Comments: Well-appearing, comfortable, cooperative  HENT:     Head: Normocephalic and atraumatic.  Eyes:     General:        Right eye: No discharge.        Left eye: No discharge.     Conjunctiva/sclera: Conjunctivae normal.  Cardiovascular:     Rate and Rhythm: Normal rate.  Pulmonary:     Effort: Pulmonary effort is normal.  Abdominal:     General: Bowel sounds are normal. There is no distension.     Palpations: Abdomen is soft. There is no mass.     Tenderness: There is no abdominal tenderness. There is no guarding.  Skin:    General: Skin is warm and dry.     Findings: No erythema or rash.  Neurological:     Mental Status: She is alert and oriented to person, place, and time.  Psychiatric:        Behavior: Behavior normal.     Comments: Well groomed, good eye contact, normal speech and thoughts    Results for orders placed or performed in visit on 10/22/18  POCT urine pregnancy  Result Value Ref Range  Preg Test, Ur Positive (A) Negative      Assessment & Plan:   Problem List Items Addressed This Visit    None    Visit Diagnoses    Positive pregnancy test    -  Primary   Relevant Orders   POCT urine pregnancy (Completed)   Ambulatory referral to Obstetrics / Gynecology   Pelvic cramping       Relevant Orders   Ambulatory referral to Obstetrics / Gynecology      New diagnosis Pregnancy today, confirmed on Urine pregnancy test, home test positive multiple times. Missed period x 1, LMP 09/22/18  Referral to [facility] for 1st pregnancy, confirmed on Urine Pregnancy in office today, multiple home pregnancy test positive yesterday, LMP 09/22/18, now missed period, she has had some pelvic cramping recently, and low grade increased temp at times to 41F, history of anemia, requesting follow-up early in pregnancy to establish care for prenatal visits - previous patient in  2016.  Advised patient that most likely symptoms are normal within early stages of pregnancy. Benign exam. Reassurance given. Reviewed precautions for return, and if needed when to seek care at hospital ED for OBGYN acute care.  No orders of the defined types were placed in this encounter.  Orders Placed This Encounter  Procedures  . Ambulatory referral to Obstetrics / Gynecology    Referral Priority:   Routine    Referral Type:   Consultation    Referral Reason:   Specialty Services Required    Referred to Provider:   Schermerhorn, Gwen Her, MD    Requested Specialty:   Obstetrics and Gynecology    Number of Visits Requested:   1  . POCT urine pregnancy    Follow up plan: Return if symptoms worsen or fail to improve, for as needed.  A total of 25 minutes was spent face-to-face with this patient. Greater than 50% of this time (approximately 15 minutes) was spent in counseling on pregnancy, course, general advice, symptom review, referral to OBGYN prenatal care advice, and future follow-up plans.  Nobie Putnam, [facility] 10/22/2018, 2:32 PM

## 2018-10-22 NOTE — Patient Instructions (Addendum)
Thank you for coming to the office today.  OB / [facility]: Obstetrics and Gynecology  Address: [address], [place], [place] [address]  Phone: [phone]  Stay tuned for apt - referral sent today  For now minor pelvic cramping is most likely normal. Keep track of it and if any symptoms change including bleeding or spotting.  May keep track of temperature, if elevated persistently >100 - notify your doctor  If any acute or severe concerns early in pregnancy - or later and need to be evaluated at hospital can go to [facility] ED or otherwise more specialized [facility] - for pregnant patients.   ( [facility] [address], [place], [place] [address] Hours: Nena Polio Main: [place]   Address: [address] [place], [place], [place], [place] [address] Hours: 8AM-5PM Phone: [phone]) if you needed other locations  Please schedule a Follow-up Appointment to: Return if symptoms worsen or fail to improve, for as needed.  If you have any other questions or concerns, please feel free to call the office or send a message through [place]. You may also schedule an earlier appointment if necessary.  Additionally, you may be receiving a survey about your experience at our office within a few days to 1 week by e-mail or mail. We value your feedback.  Nobie Putnam, DO [facility]

## 2018-11-05 NOTE — L&D Delivery Note (Addendum)
Delivery Note  Date of delivery: 06/16/2019 Estimated Date of Delivery: 06/29/19 Patient's last menstrual period was 09/22/2018. EGA: [redacted]w[redacted]d  First Stage: Induction/Augmentation : pitocin, cook catheter, misoprostol, AROM Analgesia /Anesthesia intrapartum: epidural AROM at [place] presented to L&D with concern for atypical preeclampsia with severe features. She was given pitocin for 12 hours with no cervical change. One dose of misoprostol was given, followed by [facility] catheter placement and pitocin restarted. AROM later performed. Epidural placed.   Second Stage: Complete dilation at 2128 Onset of pushing at 2137 FHR second stage: category II Delivery at 2253 on 06/16/2019  She progressed to complete and had a spontaneous vaginal birth of a live female over an intact perineum. The fetal head was delivered in direct OA position with restitution to ROA. Two loops of loose nuchal cord, reduced on the perineum, and right nuchal hand. Anterior then posterior shoulders delivered spontaneously. Baby placed on mom's abdomen and attended to by transition RN and NNP. Cord clamped and cut after 30 second delay by father of the baby. Baby then brought to the warmer for stimulation. Quickly brought back to patient for skin to skin. Cord blood obtained for newborn labs.  Third Stage: Placenta delivered spontaneously intact with 3VC at 2256 Placenta disposition: routine disposal Uterine tone firm / bleeding small IV pitocin given for hemorrhage prophylaxis  2nd degree perineal laceration identified  Anesthesia for repair: epidural Repair 3-0 Vicryl Rapide Est. Blood Loss (mL): 112  Complications: none  Mom to L&D for postpartum mag.  Baby to Couplet care / Skin to Skin.  Newborn: Birth Weight: 6 lb 6.7 oz (2910 g)  Apgar Scores: 7, 8 Feeding planned: breast   Lisette Grinder, CNM 06/17/2019 12:06 AM

## 2018-11-13 ENCOUNTER — Emergency Department
Admission: EM | Admit: 2018-11-13 | Discharge: 2018-11-13 | Disposition: A | Discharge status: Home / Self Care | Payer: 59 | Attending: Emergency Medicine | Admitting: Emergency Medicine

## 2018-11-13 ENCOUNTER — Encounter: Payer: Self-pay | Admitting: Emergency Medicine

## 2018-11-13 ENCOUNTER — Other Ambulatory Visit: Payer: Self-pay

## 2018-11-13 DIAGNOSIS — O99411 Diseases of the circulatory system complicating pregnancy, first trimester: Secondary | ICD-10-CM | POA: Diagnosis not present

## 2018-11-13 DIAGNOSIS — I959 Hypotension, unspecified: Secondary | ICD-10-CM | POA: Diagnosis not present

## 2018-11-13 DIAGNOSIS — Z3A08 8 weeks gestation of pregnancy: Secondary | ICD-10-CM | POA: Insufficient documentation

## 2018-11-13 DIAGNOSIS — R42 Dizziness and giddiness: Secondary | ICD-10-CM | POA: Insufficient documentation

## 2018-11-13 DIAGNOSIS — O9989 Other specified diseases and conditions complicating pregnancy, childbirth and the puerperium: Secondary | ICD-10-CM | POA: Insufficient documentation

## 2018-11-13 LAB — URINALYSIS, COMPLETE (UACMP) WITH MICROSCOPIC
Bacteria, UA: NONE SEEN
Bilirubin Urine: NEGATIVE
Glucose, UA: NEGATIVE mg/dL
Hgb urine dipstick: NEGATIVE
Ketones, ur: 20 mg/dL — AB
Leukocytes, UA: NEGATIVE
Nitrite: NEGATIVE
Protein, ur: NEGATIVE mg/dL
Specific Gravity, Urine: 1.018 (ref 1.005–1.030)
pH: 5 (ref 5.0–8.0)

## 2018-11-13 LAB — CBC WITH DIFFERENTIAL/PLATELET
Abs Immature Granulocytes: 0.03 K/uL (ref 0.00–0.07)
Basophils Absolute: 0.1 K/uL (ref 0.0–0.1)
Basophils Relative: 1 %
Eosinophils Absolute: 0.1 K/uL (ref 0.0–0.5)
Eosinophils Relative: 1 %
HCT: 41 % (ref 36.0–46.0)
Hemoglobin: 14 g/dL (ref 12.0–15.0)
Immature Granulocytes: 0 %
Lymphocytes Relative: 20 %
Lymphs Abs: 1.9 K/uL (ref 0.7–4.0)
MCH: 31 pg (ref 26.0–34.0)
MCHC: 34.1 g/dL (ref 30.0–36.0)
MCV: 90.9 fL (ref 80.0–100.0)
Monocytes Absolute: 0.6 K/uL (ref 0.1–1.0)
Monocytes Relative: 6 %
Neutro Abs: 7.1 K/uL (ref 1.7–7.7)
Neutrophils Relative %: 72 %
Platelets: 181 K/uL (ref 150–400)
RBC: 4.51 MIL/uL (ref 3.87–5.11)
RDW: 11.7 % (ref 11.5–15.5)
WBC: 9.7 K/uL (ref 4.0–10.5)
nRBC: 0 % (ref 0.0–0.2)

## 2018-11-13 LAB — COMPREHENSIVE METABOLIC PANEL WITH GFR
ALT: 26 U/L (ref 0–44)
AST: 18 U/L (ref 15–41)
Albumin: 4.4 g/dL (ref 3.5–5.0)
Alkaline Phosphatase: 55 U/L (ref 38–126)
Anion gap: 8 (ref 5–15)
BUN: 9 mg/dL (ref 6–20)
CO2: 23 mmol/L (ref 22–32)
Calcium: 9.3 mg/dL (ref 8.9–10.3)
Chloride: 104 mmol/L (ref 98–111)
Creatinine, Ser: 0.56 mg/dL (ref 0.44–1.00)
GFR calc Af Amer: >60 mL/min (ref >60)
GFR calc non Af Amer: >60 mL/min (ref >60)
Glucose, Bld: 91 mg/dL (ref 70–99)
Potassium: 3.9 mmol/L (ref 3.5–5.1)
Sodium: 135 mmol/L (ref 135–145)
Total Bilirubin: 0.4 mg/dL (ref 0.3–1.2)
Total Protein: 7.8 g/dL (ref 6.5–8.1)

## 2018-11-13 LAB — HCG, QUANTITATIVE, PREGNANCY: hCG, Beta Chain, Quant, S: 98627 m[IU]/mL — ABNORMAL HIGH (ref <5)

## 2018-11-13 NOTE — ED Provider Notes (Signed)
[facility] Emergency Department Provider Note  ____________________________________________  Time seen: Approximately 8:52 PM  I have reviewed the triage vital signs and the nursing notes.   HISTORY  Chief Complaint Weakness    HPI Patricia Pearson is a 23 y.o. female G1, P0 approximately [redacted] weeks pregnant presenting with lightheadedness.  The patient reports that for the past 3 days, she has been lightheaded when she stands.  She has been eating and drinking normally without any significant vomiting.  She has not had any other symptoms including fevers or chills, cough or cold, or abdominal pain.  No change in vaginal discharge or vaginal bleeding.  She checked her blood pressure at home with her stepfather's blood pressure cuff when she was symptomatic 2 days ago, and had a systolic blood pressure of 88.  She was told to drink plenty of fluids, which she has been doing.  At this time, the patient's symptoms have resolved.  Past Medical History:  Diagnosis Date  . Anemia   . Anxiety   . Kidney stone     Patient Active Problem List   Diagnosis Date Noted  . Anemia 03/04/2017  . Generalized anxiety disorder with panic attacks 03/04/2017  . Chronic tension-type headache, not intractable 09/06/2016  . Chronic right flank pain 12/07/2014  . History of kidney stones 12/07/2014  . History of hematuria 12/07/2014  . Menorrhagia 04/28/2014    Past Surgical History:  Procedure Laterality Date  . WISDOM TOOTH EXTRACTION  2016      Allergies Patient has no known allergies.  Family History  Problem Relation Age of Onset  . Basal cell carcinoma Mother   . Anxiety disorder Mother   . Drug abuse Father   . Peptic Ulcer Father   . Heart failure Maternal Grandfather   . Lung cancer Paternal Grandmother   . Colon cancer Neg Hx   . Cervical cancer Neg Hx   . Breast cancer Neg Hx     Social History Social History   Tobacco Use  . Smoking status:  Never Smoker  . Smokeless tobacco: Never Used  Substance Use Topics  . Alcohol use: No  . Drug use: No    Review of Systems Constitutional: No fever/chills.  Positive lightheadedness that syncope. Eyes: No visual changes. ENT: No sore throat. No congestion or rhinorrhea. Cardiovascular: Denies chest pain. Denies palpitations. Respiratory: Denies shortness of breath.  No cough. Gastrointestinal: No abdominal pain.  No nausea, no vomiting.  No diarrhea.  No constipation. Genitourinary: Negative for dysuria.  Change in vaginal discharge or vaginal bleeding. Musculoskeletal: Negative for back pain. Skin: Negative for rash. Neurological: Negative for headaches. No focal numbness, tingling or weakness.     ____________________________________________   PHYSICAL EXAM:  VITAL SIGNS: ED Triage Vitals  Enc Vitals Group     BP 11/13/18 1747 134/82     Pulse Rate 11/13/18 1747 88     Resp --      Temp 11/13/18 1747 98.4 F (36.9 C)     Temp Source 11/13/18 1747 Oral     SpO2 11/13/18 1747 100 %     Weight 11/13/18 1748 150 lb (68 kg)     Height 11/13/18 1748 5\' 3"  (1.6 m)     Head Circumference --      Peak Flow --      Pain Score 11/13/18 1824 0     Pain Loc --      Pain Edu? --  Excl. in [place]? --     Constitutional: Alert and oriented.  Answers questions appropriately.  The patient is able to ambulate down the hall without any difficulty. Eyes: Conjunctivae are normal.  EOMI. No scleral icterus. Head: Atraumatic. Nose: No congestion/rhinnorhea. Mouth/Throat: Mucous membranes are moist.  Neck: No stridor.  Supple.  No JVD.  No meningismus Cardiovascular: Normal rate, regular rhythm. No murmurs, rubs or gallops.  Respiratory: Normal respiratory effort.  No accessory muscle use or retractions. Lungs CTAB.  No wheezes, rales or ronchi. Gastrointestinal: Soft, nontender and nondistended.  No guarding or rebound.  No peritoneal signs. Musculoskeletal: No LE edema. No ttp in  the calves or palpable cords.  Negative Homan's sign. Neurologic:  A&Ox3.  Speech is clear.  Face and smile are symmetric.  EOMI.  Moves all extremities well. Skin:  Skin is warm, dry and intact. No rash noted. Psychiatric: Mood and affect are normal. Speech and behavior are normal.  Normal judgement.  ____________________________________________   LABS (all labs ordered are listed, but only abnormal results are displayed)  Labs Reviewed  HCG, QUANTITATIVE, PREGNANCY - Abnormal; Notable for the following components:      Result Value   hCG, Beta Chain, Quant, S [id number] (*)    All other components within normal limits  CBC WITH DIFFERENTIAL/PLATELET  COMPREHENSIVE METABOLIC PANEL  URINALYSIS, COMPLETE (UACMP) WITH MICROSCOPIC   ____________________________________________  EKG  ED ECG REPORT I, Anne-Caroline Mariea Clonts, the attending physician, personally viewed and interpreted this ECG.   Date: 11/13/2018  EKG Time: 2116  Rate: 77  Rhythm: normal sinus rhythm  Axis: normal  Intervals:none  ST&T Change: No STEMI  ____________________________________________  RADIOLOGY  No results found.  ____________________________________________   PROCEDURES  Procedure(s) performed: None  Procedures  Critical Care performed: No ____________________________________________   INITIAL IMPRESSION / ASSESSMENT AND PLAN / ED COURSE  Pertinent labs & imaging results that were available during my care of the patient were reviewed by me and considered in my medical decision making (see chart for details).  23 y.o. female G1, P0 approximately [redacted] weeks pregnant presenting for lightheadedness, hypotension at home.  Overall, the patient is well-appearing and has normal blood pressure here.  I do not know what size cuff she checked her blood pressure with at home.  Her laboratory studies are reassuring without any evidence of anemia or electrolyte disturbance.  A UA is pending.  Her hCG is  within normal range for her pregnancy.  UA and orthostatic vital signs are pending.  Life-threatening conditions have been considered but I do not see any evidence of PE, arrhythmia, etc.  We will also get a screening EKG.  If the patient's work-up in the emergency department is reassuring, I have had a long conversation with her about aggressive hydration, cautious movements between lying, sitting and standing, and reevaluation by her OB/GYN.  Follow-up instructions as well as return precautions were discussed.  ----------------------------------------- 9:26 PM on 11/13/2018 -----------------------------------------  Patient has had 3 cups of water since she has been here.  She is not orthostatic and her EKG is reassuring.  Plan discharge. ____________________________________________  FINAL CLINICAL IMPRESSION(S) / ED DIAGNOSES  Final diagnoses:  Lightheadedness  Hypotension, unspecified hypotension type         NEW MEDICATIONS STARTED DURING THIS VISIT:  New Prescriptions   No medications on file      Eula Listen, MD 11/13/18 2127

## 2018-11-13 NOTE — ED Notes (Signed)
Patient states she has been feeling nauseated and having dry heaves for 2 weeks. Patient states she is [redacted] weeks pregnant. Patient denies being around anyone sick with flu.

## 2018-11-13 NOTE — ED Notes (Signed)
Discharge paperwork reviewed with patient. Patient states understanding and signed paper discharge.

## 2018-11-13 NOTE — Discharge Instructions (Addendum)
Please drink plenty of fluid to stay well-hydrated.  Please move slowly between lying down, sitting, and standing positions as quick movements can worsen lightheadedness.  Return to the emergency department if you develop any additional symptoms including fainting, fever, vaginal bleeding, abdominal pain, shortness of breath or any other symptoms concerning to you.

## 2018-11-13 NOTE — ED Triage Notes (Signed)
Pt arrives with concerns over dizziness and weakness since Sunday. Pt also reports she is [redacted] weeks pregnant but denies vaginal discharge. Pt states she had diarrhea yesterday but denies any episodes of diarrhea today.

## 2018-12-05 DIAGNOSIS — Z3401 Encounter for supervision of normal first pregnancy, first trimester: Secondary | ICD-10-CM | POA: Diagnosis not present

## 2018-12-05 LAB — OB RESULTS CONSOLE RUBELLA ANTIBODY, IGM: Rubella: IMMUNE

## 2018-12-05 LAB — OB RESULTS CONSOLE VARICELLA ZOSTER ANTIBODY, IGG: Varicella: NON-IMMUNE/NOT IMMUNE

## 2018-12-05 LAB — OB RESULTS CONSOLE ABO/RH: RH Type: POSITIVE

## 2018-12-05 LAB — OB RESULTS CONSOLE HIV ANTIBODY (ROUTINE TESTING): HIV: NONREACTIVE

## 2018-12-05 LAB — OB RESULTS CONSOLE GC/CHLAMYDIA
Chlamydia: NEGATIVE
Gonorrhea: NEGATIVE

## 2018-12-05 LAB — OB RESULTS CONSOLE RPR: RPR: NONREACTIVE

## 2018-12-05 LAB — OB RESULTS CONSOLE ANTIBODY SCREEN: Antibody Screen: NEGATIVE

## 2018-12-05 LAB — OB RESULTS CONSOLE HEPATITIS B SURFACE ANTIGEN: Hepatitis B Surface Ag: NEGATIVE

## 2018-12-08 DIAGNOSIS — O3680X0 Pregnancy with inconclusive fetal viability, not applicable or unspecified: Secondary | ICD-10-CM | POA: Diagnosis not present

## 2019-01-12 ENCOUNTER — Other Ambulatory Visit: Payer: Self-pay

## 2019-01-12 ENCOUNTER — Emergency Department: Payer: 59

## 2019-01-12 ENCOUNTER — Encounter: Payer: Self-pay | Admitting: Emergency Medicine

## 2019-01-12 ENCOUNTER — Emergency Department
Admission: EM | Admit: 2019-01-12 | Discharge: 2019-01-12 | Disposition: A | Discharge status: Home / Self Care | Payer: 59 | Attending: Emergency Medicine | Admitting: Emergency Medicine

## 2019-01-12 DIAGNOSIS — O4690 Antepartum hemorrhage, unspecified, unspecified trimester: Secondary | ICD-10-CM

## 2019-01-12 DIAGNOSIS — O208 Other hemorrhage in early pregnancy: Secondary | ICD-10-CM | POA: Diagnosis not present

## 2019-01-12 DIAGNOSIS — Z3A16 16 weeks gestation of pregnancy: Secondary | ICD-10-CM | POA: Insufficient documentation

## 2019-01-12 DIAGNOSIS — O209 Hemorrhage in early pregnancy, unspecified: Secondary | ICD-10-CM | POA: Diagnosis not present

## 2019-01-12 LAB — ABO/RH: ABO/RH(D): O POS

## 2019-01-12 LAB — HCG, QUANTITATIVE, PREGNANCY: hCG, Beta Chain, Quant, S: 37865 m[IU]/mL — ABNORMAL HIGH (ref <5)

## 2019-01-12 LAB — POCT PREGNANCY, URINE: Preg Test, Ur: POSITIVE — AB

## 2019-01-12 NOTE — ED Triage Notes (Signed)
Pt presents to ED c/o vaginal bleeding and abd cramping starting last night after sex. Pt is [redacted] weeks pregnant.

## 2019-01-12 NOTE — ED Notes (Signed)
Lavender tube sent with light green to lab.

## 2019-01-12 NOTE — ED Provider Notes (Signed)
[facility] Emergency Department Provider Note   ____________________________________________    I have reviewed the triage vital signs and the nursing notes.   HISTORY  Chief Complaint Vaginal Bleeding     HPI Patricia Pearson is a 23 y.o. female who presents with complaints of vaginal bleeding.  Patient reports she is [redacted] weeks pregnant with her first pregnancy, yesterday after intercourse had vaginal bleeding which was at first moderate but then quickly improved.  Today she had spotting.  She does describe some cramping of her pelvis but reports this is been occurring prior to sexual intercourse/bleeding.  She is able to pull up on her MyChart that she is O+.  Has not seen her OB about this, does go to [facility] Past Medical History:  Diagnosis Date  . Anemia   . Anxiety   . Kidney stone     Patient Active Problem List   Diagnosis Date Noted  . Anemia 03/04/2017  . Generalized anxiety disorder with panic attacks 03/04/2017  . Chronic tension-type headache, not intractable 09/06/2016  . Chronic right flank pain 12/07/2014  . History of kidney stones 12/07/2014  . History of hematuria 12/07/2014  . Menorrhagia 04/28/2014    Past Surgical History:  Procedure Laterality Date  . WISDOM TOOTH EXTRACTION  2016    Prior to Admission medications   Not on File     Allergies Patient has no known allergies.  Family History  Problem Relation Age of Onset  . Basal cell carcinoma Mother   . Anxiety disorder Mother   . Drug abuse Father   . Peptic Ulcer Father   . Heart failure Maternal Grandfather   . Lung cancer Paternal Grandmother   . Colon cancer Neg Hx   . Cervical cancer Neg Hx   . Breast cancer Neg Hx     Social History Social History   Tobacco Use  . Smoking status: Never Smoker  . Smokeless tobacco: Never Used  Substance Use Topics  . Alcohol use: No  . Drug use: No    Review of Systems  Constitutional: No  fever/chills Eyes: No visual changes.  ENT: No sore throat. Cardiovascular: Denies chest pain. Respiratory: Denies shortness of breath. Gastrointestinal: As above Genitourinary: Negative for dysuria.  As above Musculoskeletal: Negative for back pain. Skin: Negative for rash. Neurological: Negative for headaches or weakness   ____________________________________________   PHYSICAL EXAM:  VITAL SIGNS: ED Triage Vitals  Enc Vitals Group     BP 01/12/19 1751 121/74     Pulse Rate 01/12/19 1751 90     Resp 01/12/19 1751 17     Temp 01/12/19 1751 97.8 F (36.6 C)     Temp Source 01/12/19 1751 Oral     SpO2 01/12/19 1751 100 %     Weight 01/12/19 1752 68 kg (150 lb)     Height 01/12/19 1752 1.6 m (5\' 3" )     Head Circumference --      Peak Flow --      Pain Score 01/12/19 1752 2     Pain Loc --      Pain Edu? --      Excl. in [place]? --     Constitutional: Alert and oriented. No acute distress. Pleasant and interactive Eyes: Conjunctivae are normal.     Cardiovascular: Normal rate, regular rhythm. Grossly normal heart sounds.  Good peripheral circulation. Respiratory: Normal respiratory effort.  No retractions. Lungs CTAB. Gastrointestinal: Soft and nontender. No distention.  No CVA tenderness. Genitourinary: deferred Musculoskeletal: No lower extremity tenderness nor edema.  Warm and well perfused Neurologic:  Normal speech and language. No gross focal neurologic deficits are appreciated.  Skin:  Skin is warm, dry and intact. No rash noted. Psychiatric: Mood and affect are normal. Speech and behavior are normal.  ____________________________________________   LABS (all labs ordered are listed, but only abnormal results are displayed)  Labs Reviewed  POCT PREGNANCY, URINE - Abnormal; Notable for the following components:      Result Value   Preg Test, Ur POSITIVE (*)    All other components within normal limits  HCG, QUANTITATIVE, PREGNANCY  POC URINE PREG, ED    ABO/RH   ____________________________________________  EKG  None ____________________________________________  RADIOLOGY  Ultrasound demonstrates 16-week IUP, no abnormalities ____________________________________________   PROCEDURES  Procedure(s) performed: No  Procedures   Critical Care performed: No ____________________________________________   INITIAL IMPRESSION / ASSESSMENT AND PLAN / ED COURSE  Pertinent labs & imaging results that were available during my care of the patient were reviewed by me and considered in my medical decision making (see chart for details).  Patient well-appearing in no acute distress, abdominal exam is quite reassuring.  No vaginal bleeding at this time reported.  She is O+.  Ultrasound results are quite reassuring to the patient, I recommended rest, increase hydration, close follow-up with OB.  Return precautions discussed    ____________________________________________   FINAL CLINICAL IMPRESSION(S) / ED DIAGNOSES  Final diagnoses:  Vaginal bleeding in pregnancy        Note:  This document was prepared using Dragon voice recognition software and may include unintentional dictation errors.   Lavonia Drafts, MD 01/12/19 Pauline Aus

## 2019-01-13 DIAGNOSIS — Z3402 Encounter for supervision of normal first pregnancy, second trimester: Secondary | ICD-10-CM | POA: Diagnosis not present

## 2019-01-13 DIAGNOSIS — O4692 Antepartum hemorrhage, unspecified, second trimester: Secondary | ICD-10-CM | POA: Diagnosis not present

## 2019-01-13 DIAGNOSIS — O4690 Antepartum hemorrhage, unspecified, unspecified trimester: Secondary | ICD-10-CM | POA: Diagnosis not present

## 2019-02-04 DIAGNOSIS — R300 Dysuria: Secondary | ICD-10-CM | POA: Diagnosis not present

## 2019-02-04 DIAGNOSIS — O26892 Other specified pregnancy related conditions, second trimester: Secondary | ICD-10-CM | POA: Diagnosis not present

## 2019-03-24 DIAGNOSIS — E538 Deficiency of other specified B group vitamins: Secondary | ICD-10-CM | POA: Diagnosis not present

## 2019-04-08 ENCOUNTER — Ambulatory Visit: Payer: 59 | Attending: Obstetrics and Gynecology

## 2019-04-08 ENCOUNTER — Other Ambulatory Visit: Payer: Self-pay

## 2019-04-08 DIAGNOSIS — R293 Abnormal posture: Secondary | ICD-10-CM

## 2019-04-08 DIAGNOSIS — M62838 Other muscle spasm: Secondary | ICD-10-CM | POA: Diagnosis present

## 2019-04-08 NOTE — Patient Instructions (Signed)
Access Code: [email or web address]  URL: [email or web address]  Date: 04/08/2019  Prepared by: Letitia Libra   Exercises  Gastroc Stretch with Foot at [place] 3 reps - 5 breaths hold - 1x daily - 7x weekly  Seated Hip Flexor Stretch - 3 reps - 5 breaths hold - 1x daily - 7x weekly  Seated Hip Adductor Stretch on Swiss Ball - 3 reps - 5 breaths hold - 1x daily - 7x weekly  Seated Alternating Side Stretch with Arm Overhead - 3 reps - 5 Breaths hold - 1x daily - 7x weekly

## 2019-04-08 NOTE — Therapy (Signed)
[place] [facility] SERVICES [address] [place], [place], [address] Phone: [phone]   Fax:  [phone]  Physical Therapy Evaluation  The patient has been informed of current processes in place at Outpatient Rehab to protect patients from Covid-19 exposure including social distancing, schedule modifications, and new cleaning procedures. After discussing their particular risk with a therapist based on the patient's personal risk factors, the patient has decided to proceed with in-person therapy.   Patient Details  Name: Patricia Pearson MRN: [id number] Date of Birth: 1996/04/05 Referring Provider (PT): Benjaman Kindler   Encounter Date: 04/08/2019  PT End of Session - 04/08/19 8768    Visit Number  1    Number of Visits  8    Date for PT Re-Evaluation  06/03/19    Authorization - Visit Number  1    Authorization - Number of Visits  8    PT Start Time  0800    PT Stop Time  0902    PT Time Calculation (min)  62 min    Equipment Utilized During Treatment  Other (comment)   pregnancy support brace   Activity Tolerance  Patient tolerated treatment well;No increased pain       Past Medical History:  Diagnosis Date  . Anemia   . Anxiety   . Kidney stone     Past Surgical History:  Procedure Laterality Date  . WISDOM TOOTH EXTRACTION  2016    There were no vitals filed for this visit.  Pelvic Floor Physical Therapy Evaluation and Assessment  SCREENING  Falls in last 6 mo: no  Red Flags:  Have you had any night sweats? no Unexplained weight loss? no Saddle anesthesia? no Unexplained changes in bowel or bladder habits? no  SUBJECTIVE  Patient reports: She is having vaginal pain with intercourse. Had pain with vaginal ultrasound and Obg. Having discharge with burning.   Mother has scoliosis.  Precautions:  [redacted] weeks pregnant  Social/Family/Vocational History:   Working as a Designer, multimedia at [employer]  Procedures/Tests/Findings:  Vaginal ultrasound,   Obstetrical History: G1P0, [redacted] weeks pregnant  Gynecological History: none  Urinary History: Small amount of SUI with cough/sneeze  Gastrointestinal History: Every-other day, not painful no straining.  Sexual activity/pain: Pain with intercourse one time and with palpation by OBGYN.  Location of pain: Lateral abdomen/ anterior hip. Near Pr/PC on R side of PFM. Current pain:  0/10  Max pain:  4/10 Least pain:  0/10 Nature of pain: hip-tight, ache, sharp and burning internally.  Patient Goals: Stop having pain.   OBJECTIVE  Posture/Observations:  Sitting:  Standing: L ilium high, anterior pelvic tilt, L PSIS low. R fold appears to indicate R anterior /L posterior rotation. Side-lying: L PSIS low Supine: L ASIS low  Palpation/Segmental Motion/Joint Play: TTP to L>R Iliacus and B add. Brevis, L>R QL, OI externally and Piriformis. Decreased mobility at R sacral border and increased at L border.  Special tests:   Stork: instability on L, decreased mobility on R. Leg-length: L: 86.5, R 87.5  Range of Motion/Flexibilty:  Spine: forward: fingers ~ 4 in. From floor, SB ~ 3 fingers from knee B. Rot WNL, nonpainful Hips: to neutral B   Strength/MMT:  LE MMT  LE MMT Left Right  Hip flex:  (L2) /5 /5  Hip ext: /5 /5  Hip abd: 5/5 4+/5  Hip add: 4+/5 4/5  Hip IR 4+/5 4/5  Hip ER 5/5 4+/5  Abdominal:  Palpation: TTP to L>R Psoas and Iliacus Diastasis: 1 finger width  Pelvic Floor External Exam: Deferred due to covid-19 precautions. Introitus Appears:  Skin integrity:  Palpation: Cough: Prolapse visible?: Scar mobility:  Internal Vaginal Exam: Strength (PERF):  Symmetry: Palpation: Prolapse:   Internal Rectal Exam: Strength (PERF): Symmetry: Palpation: Prolapse:   Gait Analysis: deferred to follow-up visit   Pelvic Floor Outcome Measures: PGQ: 18/75 (24%)   INTERVENTIONS THIS  SESSION: Self-care: Educated on and given resources to obtain heel-lift, arch support and pregnancy support brace to improve posture and alignment and decrease spasms and pain. Educated on and given resource to obtain internal TP release tool to decrease internal PFM spasms for decreased pain with intercourse.  Therex: Educated on and practiced calf stretch, hip-flexor, adductor, and side-stretch to improve balance of musculature surrounding the pelvis to decrease imbalance and improve alignment to decrease pressure on nerves from spasm and decrease pain.  Total time: 60 min.       [facility] PT Assessment - 04/08/19 0001      Assessment   Medical Diagnosis  Pelvic Pain in Pregnancy    Referring Provider (PT)  Benjaman Kindler    Onset Date/Surgical Date  01/10/19    Prior Therapy  none      Precautions   Precautions  None      Restrictions   Weight Bearing Restrictions  No      Balance Screen   Has the patient fallen in the past 6 months  No      [place] residence    Living Arrangements  Spouse/significant other    Type of [place] to enter    Entrance Stairs-Number of Steps  3    Entrance Stairs-Rails  Left    [place]  One level      Prior Function   Level of Independence  Independent      Cognition   Overall Cognitive Status  Within Functional Limits for tasks assessed                Objective measurements completed on examination: See above findings.                PT Short Term Goals - 04/08/19 8032      PT SHORT TERM GOAL #1   Title  Patient will demonstrate improved sitting and standing posture to demonstrate learning and decrease stress on the pelvic floor with functional activity.    Baseline  Anterior pelvic tilt/hyperlordosis, knees hyperextended    Time  4    Period  Weeks    Status  New    Target Date  05/06/19      PT SHORT TERM GOAL #2   Title  Patient  will demonstrate ability to perform self internal TP release in order to facilitate further PFM spasm reduction at home for faster resolution of symptoms.    Baseline  Spasms internally on R>L through posterior PFM    Time  4    Period  Weeks    Status  New    Target Date  05/06/19      PT SHORT TERM GOAL #3   Title  Patient will demonstrate improved pelvic alignment and balance of musculature surrounding the pelvis to facilitate decreased PFM spasms and decrease pelvic pain.    Baseline  L up-slip and LLE long. Spasms through L>R hip-flexors and posterior  hip musculature as well as B adductors.    Time  4    Period  Weeks    Status  New    Target Date  05/06/19        PT Long Term Goals - 04/08/19 0932      PT LONG TERM GOAL #1   Title  Patient will report no pain with intercourse to demonstrate improved functional ability.    Baseline  Pain during intercourse and burning following.    Time  8    Period  Weeks    Status  New    Target Date  06/03/19      PT LONG TERM GOAL #2   Title  Patient will report no episodes of SUI over the course of the prior two weeks to demonstrate improved functional ability.    Baseline  SUI with cough/sneeze occasionally    Time  8    Period  Weeks    Status  New    Target Date  06/03/19      PT LONG TERM GOAL #3   Title  Patient will describe pain no greater than 2/10 during full day of work to demonstrate improved functional ability.    Baseline  pain up to 4/10 with prolonged standing, bending, lifting etc.    Time  8    Period  Weeks    Status  New    Target Date  06/03/19      PT LONG TERM GOAL #4   Title  Patient will score at or below 13% on the PGQ to demonstrate a clinically significant decrease in disability and improved functional ability.    Baseline  PGQ: 18/75 (24%)    Time  8    Period  Weeks    Status  New    Target Date  06/03/19             Plan - 04/08/19 0916    Clinical Impression Statement  Pt. is a 23  y/o female who presents today with cheif c/o pain with intercourse and with bending/lifting and prolonged walking as well as mild SUI. Her PMH is significant for Anxiety and current pregnancy status. Her clinical exam reveals a leg-length discrepancy of ~1 cm with the LLE longer nd up-slipped, decreased sacral stability on the L and decreased AIJ mobility on the R as well as spasms surrounding the pellvis and hyperlordosis. She will benefit from skilled pelvic health PT to address these defecits and to assess for and address other potential causes of pain.    Personal Factors and Comorbidities  Comorbidity 1    Comorbidities  leg-length discrepancy, [redacted] weeks pregnant    Examination-Activity Limitations  Locomotion Level;Continence;Squat;Lift;Bend    Examination-Participation Restrictions  Cleaning;Interpersonal Relationship;Community Activity    Stability/Clinical Decision Making  Evolving/Moderate complexity    Clinical Decision Making  Moderate    PT Frequency  1x / week    PT Duration  8 weeks    PT Next Visit Plan  Decrease spasms, give heel-lift, educate on posture further and give  handout, educate on how to do self internal TP release.     PT Home Exercise Plan  [id number] , arch support, pregnancy support brace, heel-lift, tool for internal TP release,     Consulted and Agree with Plan of Care  Patient       Patient will benefit from skilled therapeutic intervention in order to improve the following deficits and impairments:  Improper body mechanics, Pain,  Increased muscle spasms, Postural dysfunction, Decreased activity tolerance, Decreased strength, Difficulty walking, Impaired flexibility  Visit Diagnosis: Other muscle spasm  Abnormal posture     Problem List Patient Active Problem List   Diagnosis Date Noted  . Anemia 03/04/2017  . Generalized anxiety disorder with panic attacks 03/04/2017  . Chronic tension-type headache, not intractable 09/06/2016  . Chronic right flank  pain 12/07/2014  . History of kidney stones 12/07/2014  . History of hematuria 12/07/2014  . Menorrhagia 04/28/2014   Willa Rough DPT, ATC Willa Rough 04/08/2019, 9:49 AM  [place] [facility] SERVICES [address] [place], [place], [address] Phone: [phone]   Fax:  [phone]  Name: Patricia Pearson MRN: [id number] Date of Birth: Oct 17, 1996

## 2019-04-11 ENCOUNTER — Observation Stay
Admission: EM | Admit: 2019-04-11 | Discharge: 2019-04-11 | Disposition: A | Discharge status: Home / Self Care | Payer: 59 | Attending: Obstetrics and Gynecology | Admitting: Obstetrics and Gynecology

## 2019-04-11 ENCOUNTER — Encounter: Payer: Self-pay | Admitting: *Deleted

## 2019-04-11 ENCOUNTER — Other Ambulatory Visit: Payer: Self-pay

## 2019-04-11 DIAGNOSIS — Z960 Presence of urogenital implants: Secondary | ICD-10-CM | POA: Insufficient documentation

## 2019-04-11 DIAGNOSIS — R102 Pelvic and perineal pain: Secondary | ICD-10-CM | POA: Diagnosis not present

## 2019-04-11 DIAGNOSIS — N949 Unspecified condition associated with female genital organs and menstrual cycle: Secondary | ICD-10-CM | POA: Diagnosis present

## 2019-04-11 DIAGNOSIS — Z87442 Personal history of urinary calculi: Secondary | ICD-10-CM | POA: Insufficient documentation

## 2019-04-11 DIAGNOSIS — F419 Anxiety disorder, unspecified: Secondary | ICD-10-CM | POA: Diagnosis not present

## 2019-04-11 DIAGNOSIS — O23513 Infections of cervix in pregnancy, third trimester: Secondary | ICD-10-CM | POA: Insufficient documentation

## 2019-04-11 DIAGNOSIS — Z3A28 28 weeks gestation of pregnancy: Secondary | ICD-10-CM | POA: Diagnosis not present

## 2019-04-11 DIAGNOSIS — O26893 Other specified pregnancy related conditions, third trimester: Principal | ICD-10-CM | POA: Insufficient documentation

## 2019-04-11 DIAGNOSIS — O99343 Other mental disorders complicating pregnancy, third trimester: Secondary | ICD-10-CM | POA: Insufficient documentation

## 2019-04-11 LAB — URINALYSIS, COMPLETE (UACMP) WITH MICROSCOPIC
Bilirubin Urine: NEGATIVE
Glucose, UA: NEGATIVE mg/dL
Ketones, ur: 5 mg/dL — AB
Nitrite: NEGATIVE
Protein, ur: NEGATIVE mg/dL
RBC / HPF: >50 RBC/hpf — ABNORMAL HIGH (ref 0–5)
Specific Gravity, Urine: 1.021 (ref 1.005–1.030)
pH: 6 (ref 5.0–8.0)

## 2019-04-11 LAB — WET PREP, GENITAL
Clue Cells Wet Prep HPF POC: NONE SEEN
Sperm: NONE SEEN
Trich, Wet Prep: NONE SEEN
Yeast Wet Prep HPF POC: NONE SEEN

## 2019-04-11 LAB — CHLAMYDIA/NGC RT PCR (ARMC ONLY)
Chlamydia Tr: NOT DETECTED
N gonorrhoeae: NOT DETECTED

## 2019-04-11 MED ORDER — ACETAMINOPHEN 500 MG PO TABS: 1000.0000 mg | ORAL_TABLET | Freq: Four times a day (QID) | ORAL | 0 refills | Status: DC | PRN

## 2019-04-11 MED ORDER — SODIUM CHLORIDE FLUSH 0.9 % IV SOLN
INTRAVENOUS | Status: AC
  Filled 2019-04-11: qty 10

## 2019-04-11 MED ORDER — PRENATAL MULTIVITAMIN CH: 1.0000 | ORAL_TABLET | Freq: Every day | ORAL | Status: DC

## 2019-04-11 MED ORDER — ZOLPIDEM TARTRATE 5 MG PO TABS: 5.0000 mg | ORAL_TABLET | Freq: Every evening | ORAL | Status: DC | PRN

## 2019-04-11 MED ORDER — OXYCODONE HCL 5 MG PO TABS: 5.0000 mg | ORAL_TABLET | Freq: Four times a day (QID) | ORAL | 0 refills | Status: DC | PRN

## 2019-04-11 MED ORDER — CEFTRIAXONE SODIUM 250 MG IJ SOLR
250.0000 mg | Freq: Once | INTRAMUSCULAR | Status: AC
  Administered 2019-04-11: 250 mg via INTRAMUSCULAR
  Filled 2019-04-11: qty 250

## 2019-04-11 MED ORDER — DOCUSATE SODIUM 100 MG PO CAPS: 100.0000 mg | ORAL_CAPSULE | Freq: Every day | ORAL | 0 refills | Status: DC

## 2019-04-11 MED ORDER — DOCUSATE SODIUM 100 MG PO CAPS: 100.0000 mg | ORAL_CAPSULE | Freq: Every day | ORAL | Status: DC

## 2019-04-11 MED ORDER — AZITHROMYCIN 500 MG PO TABS
1000.0000 mg | ORAL_TABLET | Freq: Once | ORAL | Status: AC
  Administered 2019-04-11: 1000 mg via ORAL
  Filled 2019-04-11: qty 2

## 2019-04-11 MED ORDER — CALCIUM CARBONATE ANTACID 500 MG PO CHEW: 2.0000 | CHEWABLE_TABLET | ORAL | Status: DC | PRN

## 2019-04-11 MED ORDER — ACETAMINOPHEN 325 MG PO TABS: 650.0000 mg | ORAL_TABLET | ORAL | Status: DC | PRN

## 2019-04-11 MED ORDER — TAMSULOSIN HCL 0.4 MG PO CAPS: 0.4000 mg | ORAL_CAPSULE | Freq: Every day | ORAL | 0 refills | Status: DC

## 2019-04-11 MED ORDER — LIDOCAINE HCL (PF) 1 % IJ SOLN
INTRAMUSCULAR | Status: AC
  Filled 2019-04-11: qty 30

## 2019-04-11 NOTE — Discharge Summary (Signed)
Patricia Pearson is a 23 y.o. female. She is at [redacted]w[redacted]d gestation. Patient's last menstrual period was 09/22/2018. Estimated Date of Delivery: 06/29/19  Prenatal care site: [facility]   Current pregnancy complicated by:  1. Varicella non-immune 2. Resolved low-lying placenta 3. Vaginismus, started pelvic floor PT on 04/08/19  Chief complaint: left flank pain, pelvic muscle spasm, heaviness in lower abdomen.  Location: left flank pain, pelvic muscle spasm, heaviness in lower abdomen. Onset/timing: yesterday morning Duration: intermittent  Quality: aching left flank, heaviness lower abdomen and pelvis.  Severity: 2/10 discomfort Aggravating or alleviating conditions: has PO hydrated with about 6 glasses of water.   Associated signs/symptoms: burning with urination, feels she has been unable to urinate since this am.  Context: hx vaginismus, hx kidney stone with stent on right.   S: Resting comfortably. no CTX, no VB.no LOF,  Active fetal movement. Denies: HA, visual changes, SOB, or RUQ/epigastric pain  Maternal Medical History:   Past Medical History:  Diagnosis Date  . Anemia   . Anxiety   . Kidney stone     Past Surgical History:  Procedure Laterality Date  . STENT PLACEMENT RT URETER ([place] HX) Right 04/09/2013  . WISDOM TOOTH EXTRACTION  2016    No Known Allergies  Prior to Admission medications   Medication Sig Start Date End Date Taking? Authorizing Provider  Prenatal Vit-Fe Fumarate-FA (MULTIVITAMIN-PRENATAL) 27-0.8 MG TABS tablet Take 1 tablet by mouth daily at 12 noon.    [provider]      Social History: She  reports that she has never smoked. She has never used smokeless tobacco. She reports that she does not drink alcohol or use drugs.  Family History: family history includes Anxiety disorder in her mother; Basal cell carcinoma in her mother; Drug abuse in her father; Heart failure in her maternal grandfather; Lung cancer in her paternal  grandmother; Peptic Ulcer in her father.   Review of Systems: A full review of systems was performed and negative except as noted in the HPI.     O:  BP 122/76 (BP Location: Right Arm)   Pulse (!) 102   Temp 99.2 F (37.3 C) (Oral)   Resp 20   LMP 09/22/2018  Results for orders placed or performed during the hospital encounter of 04/11/19 (from the past 48 hour(s))  Wet prep, genital   Collection Time: 04/11/19  6:35 PM  Result Value Ref Range   Yeast Wet Prep HPF POC NONE SEEN NONE SEEN   Trich, Wet Prep NONE SEEN NONE SEEN   Clue Cells Wet Prep HPF POC NONE SEEN NONE SEEN   WBC, Wet Prep HPF POC FEW (A) NONE SEEN   Sperm NONE SEEN   Urinalysis, Complete w Microscopic   Collection Time: 04/11/19  6:35 PM  Result Value Ref Range   Color, Urine YELLOW (A) YELLOW   APPearance HAZY (A) CLEAR   Specific Gravity, Urine 1.021 1.005 - 1.030   pH 6.0 5.0 - 8.0   Glucose, UA NEGATIVE NEGATIVE mg/dL   Hgb urine dipstick MODERATE (A) NEGATIVE   Bilirubin Urine NEGATIVE NEGATIVE   Ketones, ur 5 (A) NEGATIVE mg/dL   Protein, ur NEGATIVE NEGATIVE mg/dL   Nitrite NEGATIVE NEGATIVE   Leukocytes,Ua SMALL (A) NEGATIVE   RBC / HPF >50 (H) 0 - 5 RBC/hpf   WBC, UA 11-20 0 - 5 WBC/hpf   Bacteria, UA RARE (A) NONE SEEN   Squamous Epithelial / LPF 6-10 0 - 5  Mucus PRESENT      Constitutional: NAD, AAOx3  HE/ENT: extraocular movements grossly intact, moist mucous membranes CV: RRR PULM: nl respiratory effort, CTABL     Abd: gravid, non-tender, non-distended, soft      Ext: Non-tender, Nonedematous; slight left CVA tenderness.    Psych: mood appropriate, speech normal Pelvic: SSE done: mod amt yellow discharge, + erythema, cervix very friable with bright red bleeding with swab light touch. Cervix visually closed. Wet prep and GC/CT obtained.   Fetal  monitoring: Cat I Appropriate for GA Baseline: 145bpm Variability: moderate Accelerations: present x >2, 10*10bpm Decelerations  absent Time 47mins  Toco: no UCs noted    A/P: 23 y.o. [redacted]w[redacted]d here for antenatal surveillance for Pelvic pain, left flank pain  Principle Diagnosis:  Left flank pain, pelvic pain.  - Acute cervicitis   Preterm labor: not present.   Fetal Wellbeing: Reassuring Cat 1 tracing with reactive NST   Tx for acute cervicitis, Rocephin 250mg  IM and Azithromycin 1000mg  PO x 1  UA: mod Hgb, > 50RBCs; will order outpatient renal US in office at [facility].   Suspect nephrolithiasis based on sx and UA.   wet prep negative and GC/CT: pending; urine culture added on- but low suspicion for UTI.   Flomax ordered daily and oxycodone 5mg  PO q6-8hrs prn.    Keep pelvic floor PT appt as scheduled on Weds 6/10  D/c home stable, precautions reviewed, follow-up as scheduled.    Francetta Found, CNM 04/11/2019  7:52 PM

## 2019-04-11 NOTE — OB Triage Note (Signed)
Patient states she started having lower abdominal pain yesterday morning that has now radiated to her back. Pt rates 2-4/10 pain scale that is sharp and achy. Denies LOF, vaginal bleeding, or abnormal discharge. Pt states it burns to pee and she has been unable to pee much even though she is drinking tons of water. States baby is moving well.

## 2019-04-12 ENCOUNTER — Emergency Department
Admission: EM | Admit: 2019-04-12 | Discharge: 2019-04-12 | Disposition: A | Discharge status: Home / Self Care | Payer: 59 | Source: Home / Self Care

## 2019-04-12 ENCOUNTER — Encounter: Payer: Self-pay | Admitting: *Deleted

## 2019-04-12 ENCOUNTER — Other Ambulatory Visit: Payer: Self-pay | Admitting: Obstetrics and Gynecology

## 2019-04-12 ENCOUNTER — Other Ambulatory Visit: Payer: Self-pay

## 2019-04-12 ENCOUNTER — Inpatient Hospital Stay: Payer: 59

## 2019-04-12 ENCOUNTER — Observation Stay
Admission: RE | Admit: 2019-04-12 | Discharge: 2019-04-13 | Disposition: A | Discharge status: Home / Self Care | Payer: 59 | Attending: Obstetrics and Gynecology | Admitting: Obstetrics and Gynecology

## 2019-04-12 DIAGNOSIS — O9989 Other specified diseases and conditions complicating pregnancy, childbirth and the puerperium: Principal | ICD-10-CM | POA: Insufficient documentation

## 2019-04-12 DIAGNOSIS — Z3A28 28 weeks gestation of pregnancy: Secondary | ICD-10-CM | POA: Insufficient documentation

## 2019-04-12 DIAGNOSIS — R10A0 Flank pain, unspecified side: Secondary | ICD-10-CM | POA: Diagnosis present

## 2019-04-12 DIAGNOSIS — Z1159 Encounter for screening for other viral diseases: Secondary | ICD-10-CM | POA: Insufficient documentation

## 2019-04-12 DIAGNOSIS — R109 Unspecified abdominal pain: Secondary | ICD-10-CM

## 2019-04-12 DIAGNOSIS — R102 Pelvic and perineal pain: Secondary | ICD-10-CM | POA: Diagnosis present

## 2019-04-12 DIAGNOSIS — O26893 Other specified pregnancy related conditions, third trimester: Secondary | ICD-10-CM

## 2019-04-12 DIAGNOSIS — Z87442 Personal history of urinary calculi: Secondary | ICD-10-CM | POA: Diagnosis not present

## 2019-04-12 DIAGNOSIS — N1330 Unspecified hydronephrosis: Secondary | ICD-10-CM

## 2019-04-12 DIAGNOSIS — N200 Calculus of kidney: Secondary | ICD-10-CM | POA: Insufficient documentation

## 2019-04-12 DIAGNOSIS — Z5321 Procedure and treatment not carried out due to patient leaving prior to being seen by health care provider: Secondary | ICD-10-CM | POA: Insufficient documentation

## 2019-04-12 DIAGNOSIS — O26899 Other specified pregnancy related conditions, unspecified trimester: Secondary | ICD-10-CM | POA: Diagnosis present

## 2019-04-12 LAB — URINALYSIS, ROUTINE W REFLEX MICROSCOPIC
Bilirubin Urine: NEGATIVE
Glucose, UA: NEGATIVE mg/dL
Ketones, ur: 80 mg/dL — AB
Nitrite: NEGATIVE
Protein, ur: 30 mg/dL — AB
RBC / HPF: >50 RBC/hpf — ABNORMAL HIGH (ref 0–5)
Specific Gravity, Urine: 1.02 (ref 1.005–1.030)
WBC, UA: >50 WBC/hpf — ABNORMAL HIGH (ref 0–5)
pH: 5 (ref 5.0–8.0)

## 2019-04-12 LAB — SAMPLE TO BLOOD BANK

## 2019-04-12 LAB — COMPREHENSIVE METABOLIC PANEL
ALT: 13 U/L (ref 0–44)
AST: 17 U/L (ref 15–41)
Albumin: 3.3 g/dL — ABNORMAL LOW (ref 3.5–5.0)
Alkaline Phosphatase: 93 U/L (ref 38–126)
Anion gap: 10 (ref 5–15)
BUN: 9 mg/dL (ref 6–20)
CO2: 18 mmol/L — ABNORMAL LOW (ref 22–32)
Calcium: 8.3 mg/dL — ABNORMAL LOW (ref 8.9–10.3)
Chloride: 104 mmol/L (ref 98–111)
Creatinine, Ser: 0.7 mg/dL (ref 0.44–1.00)
GFR calc Af Amer: >60 mL/min (ref >60)
GFR calc non Af Amer: >60 mL/min (ref >60)
Glucose, Bld: 95 mg/dL (ref 70–99)
Potassium: 3.5 mmol/L (ref 3.5–5.1)
Sodium: 132 mmol/L — ABNORMAL LOW (ref 135–145)
Total Bilirubin: 0.7 mg/dL (ref 0.3–1.2)
Total Protein: 6.5 g/dL (ref 6.5–8.1)

## 2019-04-12 LAB — CBC
HCT: 29.3 % — ABNORMAL LOW (ref 36.0–46.0)
Hemoglobin: 10.2 g/dL — ABNORMAL LOW (ref 12.0–15.0)
MCH: 31.6 pg (ref 26.0–34.0)
MCHC: 34.8 g/dL (ref 30.0–36.0)
MCV: 90.7 fL (ref 80.0–100.0)
Platelets: 166 10*3/uL (ref 150–400)
RBC: 3.23 MIL/uL — ABNORMAL LOW (ref 3.87–5.11)
RDW: 11.9 % (ref 11.5–15.5)
WBC: 18 10*3/uL — ABNORMAL HIGH (ref 4.0–10.5)
nRBC: 0 % (ref 0.0–0.2)

## 2019-04-12 LAB — SARS CORONAVIRUS 2 BY RT PCR (HOSPITAL ORDER, PERFORMED IN ~~LOC~~ HOSPITAL LAB): SARS Coronavirus 2: NEGATIVE

## 2019-04-12 MED ORDER — LACTATED RINGERS IV SOLN
INTRAVENOUS | Status: DC
  Administered 2019-04-12 – 2019-04-13 (×3): via INTRAVENOUS

## 2019-04-12 MED ORDER — ONDANSETRON HCL 4 MG/2ML IJ SOLN
4.0000 mg | Freq: Four times a day (QID) | INTRAMUSCULAR | Status: DC | PRN
  Administered 2019-04-12: 4 mg via INTRAVENOUS
  Filled 2019-04-12: qty 2

## 2019-04-12 MED ORDER — MORPHINE SULFATE (PF) 2 MG/ML IV SOLN
2.0000 mg | INTRAVENOUS | Status: DC | PRN
  Administered 2019-04-12 (×3): 2 mg via INTRAVENOUS
  Filled 2019-04-12 (×3): qty 1

## 2019-04-12 MED ORDER — DOCUSATE SODIUM 100 MG PO CAPS
100.0000 mg | ORAL_CAPSULE | Freq: Every day | ORAL | Status: DC
  Administered 2019-04-13: 100 mg via ORAL
  Filled 2019-04-12: qty 1

## 2019-04-12 MED ORDER — ACETAMINOPHEN 325 MG PO TABS
650.0000 mg | ORAL_TABLET | ORAL | Status: DC | PRN
  Administered 2019-04-13 (×2): 650 mg via ORAL
  Filled 2019-04-12 (×2): qty 2

## 2019-04-12 MED ORDER — CALCIUM CARBONATE ANTACID 500 MG PO CHEW
2.0000 | CHEWABLE_TABLET | ORAL | Status: DC | PRN
  Administered 2019-04-13: 400 mg via ORAL
  Filled 2019-04-12: qty 2

## 2019-04-12 MED ORDER — PRENATAL MULTIVITAMIN CH
1.0000 | ORAL_TABLET | Freq: Every day | ORAL | Status: DC
  Administered 2019-04-13: 1 via ORAL
  Filled 2019-04-12: qty 1

## 2019-04-12 MED ORDER — ZOLPIDEM TARTRATE 5 MG PO TABS: 5.0000 mg | ORAL_TABLET | Freq: Every evening | ORAL | Status: DC | PRN

## 2019-04-12 MED ORDER — TAMSULOSIN HCL 0.4 MG PO CAPS
0.4000 mg | ORAL_CAPSULE | Freq: Every day | ORAL | Status: DC
  Administered 2019-04-13: 0.4 mg via ORAL
  Filled 2019-04-12: qty 1

## 2019-04-12 MED ORDER — SODIUM CHLORIDE 0.9 % IV SOLN
1.0000 g | Freq: Two times a day (BID) | INTRAVENOUS | Status: DC
  Administered 2019-04-13 (×2): 1 g via INTRAVENOUS
  Filled 2019-04-12: qty 10
  Filled 2019-04-12: qty 1
  Filled 2019-04-12: qty 10
  Filled 2019-04-12: qty 1

## 2019-04-12 MED ORDER — SODIUM CHLORIDE 0.9 % IV SOLN
INTRAVENOUS | Status: DC | PRN
  Administered 2019-04-13 (×2): 250 mL via INTRAVENOUS

## 2019-04-12 NOTE — H&P (Signed)
Patricia Pearson is a 23 y.o. female. She is at [redacted]w[redacted]d gestation. Patient's last menstrual period was 09/22/2018. Estimated Date of Delivery: 06/29/19  Prenatal care site: [facility]   Current pregnancy complicated by:  1. Varicella non-immune 2. Resolved low-lying placenta 3. Vaginismus, started pelvic floor PT on 04/08/19 4. Hx kidney stones, hematuria and flank pain- seen 6/6 in L&D triage  Chief complaint: Worsening pain and nausea  Location: left flank pain, pelvic pain Onset/timing: pain initially started Friday morning 6/5, worsened significantly today. Duration: constant with surges of worsening pain.  Quality: sharp stabbing Severity: 7-10/10 pain in left flank Aggravating or alleviating conditions: any pressure on flank pain Associated signs/symptoms: nausea and vomiting, unable to keep meds or fluids down all day today. Unable to keep pain meds down.  Context: prior hx kidney stones. Seen and treated yesterday for acute cervicitis and left flank pain with presumed nephrolithiasis.   S: Resting comfortably. no CTX, no LOF, slight spotting since yesterday (s/p pelvic exam and tx for cervicitis).  Active fetal movement. Denies: HA, visual changes, SOB, or RUQ/epigastric pain. Left flank pain 7-10/10  Maternal Medical History:   Past Medical History:  Diagnosis Date  . Anemia   . Anxiety   . Kidney stone     Past Surgical History:  Procedure Laterality Date  . STENT PLACEMENT RT URETER ([place] HX) Right 04/09/2013  . WISDOM TOOTH EXTRACTION  2016    No Known Allergies  Prior to Admission medications   Medication Sig Start Date End Date Taking? Authorizing Provider  acetaminophen (TYLENOL) 500 MG tablet Take 2 tablets (1,000 mg total) by mouth every 6 (six) hours as needed for mild pain (for pain scale < 4  OR  temperature  >/=  100.5 F). 04/11/19   Tige Meas A, CNM  docusate sodium (COLACE) 100 MG capsule Take 1 capsule (100 mg total) by mouth daily.  04/11/19   Shateria Paternostro, Murray Hodgkins, CNM  oxyCODONE (ROXICODONE) 5 MG immediate release tablet Take 1 tablet (5 mg total) by mouth every 6 (six) hours as needed for up to 3 days. 04/11/19 04/14/19  Parrie Rasco, Murray Hodgkins, CNM  Prenatal Vit-Fe Fumarate-FA (MULTIVITAMIN-PRENATAL) 27-0.8 MG TABS tablet Take 1 tablet by mouth daily at 12 noon.    [provider]  tamsulosin (FLOMAX) 0.4 MG CAPS capsule Take 1 capsule (0.4 mg total) by mouth daily after supper for 10 days. 04/11/19 04/21/19  Adilynn Bessey, Murray Hodgkins, CNM      Social History: She  reports that she has never smoked. She has never used smokeless tobacco. She reports that she does not drink alcohol or use drugs.  Family History: family history includes Anxiety disorder in her mother; Basal cell carcinoma in her mother; Drug abuse in her father; Heart failure in her maternal grandfather; Lung cancer in her paternal grandmother; Peptic Ulcer in her father.   Review of Systems: A full review of systems was performed and negative except as noted in the HPI.     O:  BP 122/65 (BP Location: Right Arm)   Pulse (!) 116   Temp 98.7 F (37.1 C) (Oral)   Resp 18   Ht 5\' 3"  (1.6 m)   Wt 75.8 kg   LMP 09/22/2018   BMI 29.58 kg/m  Results for orders placed or performed during the hospital encounter of 04/12/19 (from the past 48 hour(s))  Urinalysis, Routine w reflex microscopic   Collection Time: 04/12/19  4:59 PM  Result Value Ref Range  Color, Urine YELLOW (A) YELLOW   APPearance CLOUDY (A) CLEAR   Specific Gravity, Urine 1.020 1.005 - 1.030   pH 5.0 5.0 - 8.0   Glucose, UA NEGATIVE NEGATIVE mg/dL   Hgb urine dipstick LARGE (A) NEGATIVE   Bilirubin Urine NEGATIVE NEGATIVE   Ketones, ur 80 (A) NEGATIVE mg/dL   Protein, ur 30 (A) NEGATIVE mg/dL   Nitrite NEGATIVE NEGATIVE   Leukocytes,Ua LARGE (A) NEGATIVE   RBC / HPF >50 (H) 0 - 5 RBC/hpf   WBC, UA >50 (H) 0 - 5 WBC/hpf   Bacteria, UA FEW (A) NONE SEEN   Squamous Epithelial / LPF 0-5 0 - 5   WBC  Clumps PRESENT    Mucus PRESENT   Results for orders placed or performed during the hospital encounter of 04/11/19 (from the past 48 hour(s))  Wet prep, genital   Collection Time: 04/11/19  6:35 PM  Result Value Ref Range   Yeast Wet Prep HPF POC NONE SEEN NONE SEEN   Trich, Wet Prep NONE SEEN NONE SEEN   Clue Cells Wet Prep HPF POC NONE SEEN NONE SEEN   WBC, Wet Prep HPF POC FEW (A) NONE SEEN   Sperm NONE SEEN   Chlamydia/NGC rt PCR ([facility] only)   Collection Time: 04/11/19  6:35 PM  Result Value Ref Range   Specimen source GC/Chlam ENDOCERVICAL    Chlamydia Tr NOT DETECTED NOT DETECTED   N gonorrhoeae NOT DETECTED NOT DETECTED  Urinalysis, Complete w Microscopic   Collection Time: 04/11/19  6:35 PM  Result Value Ref Range   Color, Urine YELLOW (A) YELLOW   APPearance HAZY (A) CLEAR   Specific Gravity, Urine 1.021 1.005 - 1.030   pH 6.0 5.0 - 8.0   Glucose, UA NEGATIVE NEGATIVE mg/dL   Hgb urine dipstick MODERATE (A) NEGATIVE   Bilirubin Urine NEGATIVE NEGATIVE   Ketones, ur 5 (A) NEGATIVE mg/dL   Protein, ur NEGATIVE NEGATIVE mg/dL   Nitrite NEGATIVE NEGATIVE   Leukocytes,Ua SMALL (A) NEGATIVE   RBC / HPF >50 (H) 0 - 5 RBC/hpf   WBC, UA 11-20 0 - 5 WBC/hpf   Bacteria, UA RARE (A) NONE SEEN   Squamous Epithelial / LPF 6-10 0 - 5   Mucus PRESENT      Constitutional: NAD, AAOx3  HE/ENT: extraocular movements grossly intact, moist mucous membranes CV: RRR PULM: nl respiratory effort, CTABL     Abd: gravid, non-tender, non-distended, soft      Ext: Non-tender, Nonedematous   Psych: mood appropriate, speech normal Pelvic: deferred  Fetal  monitoring: Cat I  Appropriate for GA Baseline: 140bpm  Variability: moderate Accelerations:  present x >2, 10*10bpm Decelerations absent Time 60mins    A/P: 23 y.o. [redacted]w[redacted]d here for antenatal surveillance for left flank pain, Kidney stones  Principle Diagnosis:  left flank pain, Kidney stones, [redacted]wks GA   No e/o PTL, no UCs  IV  Morphine for pain mgmt  Zofran for nausea  Tamsulosin 0.4mg  PO daily x 10d  Renal US: bilateral hydronephrosis with blockage of left ureter.   IV hydration with LR  Urine culture pending, obtained yesterday   Francetta Found, CNM 04/12/2019  5:36 PM

## 2019-04-12 NOTE — ED Triage Notes (Signed)
Pt to ED reporting increased pain from a potential kidney stone. Pt was dx yesterday without a CT scan but has been unable to keep medications down and feels as though the pain has worsened. Pt tearful in triage. Increased difficulty urinating per pt.

## 2019-04-12 NOTE — ED Notes (Signed)
First Nurse Note: Pt c/o left flank pain. Pt states that she is [redacted] weeks pregnant and was seen yesterday in L & D and diagnosed with kidney stone. Pt reports that pain is getting worse. Pt is in NAD.   Per charge RN Brandy, pt to be seen in ED

## 2019-04-12 NOTE — ED Notes (Signed)
Pt mother came in to check on pt and then came to the front desk stating that pt has decided to go home and call her OB, pt will come back if not getting any better.

## 2019-04-12 NOTE — Progress Notes (Signed)
Urology into see patient

## 2019-04-12 NOTE — ED Notes (Signed)
Pt mother in to check on pt, pt mother to front desk to ask about wait time for pt. Informed mother that we cannot give a time but that she had 1 person who had been here longer than her and that we would get her back to a room as soon as we could. Pt mother verbalized understanding.  Pt given warm blanket and was assisted to restroom by her mother.

## 2019-04-12 NOTE — Consult Note (Signed)
Consultation: Left hydronephrosis Requested by: Dr. Benjaman Kindler  History of Present Illness: Ms. Patricia Pearson is a 23 year old female who is about [redacted] weeks pregnant who developed LEFT flank pain and pelvic pain a couple of days ago.  She was treated with antibiotics for cervicitis but returned today with worsening left flank pain.  Her creatinine was 0.7, white count 18.  UA showed many bacteria, greater than 50 red cells, greater than 50 white cells.  Ultrasound was done which showed a normal right kidney and mild to moderate left hydronephrosis.  The bladder appeared normal. She was given Rocephin and azithromycin.  Urine culture pending.  She is also been given morphine and IV fluids.  She is resting comfortably tonight and feels much better.  She is also on tamsulosin.  She has not seen a stone pass, but again her pain is better specifically she had some dysuria yesterday but that has cleared.  She is voiding without difficulty.  Nurse was chaperone for entire history and physical.  [name] has a history of RIGHT UPJ obstruction status post pyeloplasty.  Per the notes on care everywhere from [place] she required a stent for right flank pain in 2016.  Her pain did not improve, so the stent was removed and she underwent a right nephrostomy tube and a Whitaker test.  Whitaker test is reported as normal.  Past Medical History:  Diagnosis Date  . Anemia   . Anxiety   . Kidney stone    Past Surgical History:  Procedure Laterality Date  . STENT PLACEMENT RT URETER ([place] HX) Right 04/09/2013  . WISDOM TOOTH EXTRACTION  2016    Home Medications:  Medications Prior to Admission  Medication Sig Dispense Refill Last Dose  . acetaminophen (TYLENOL) 500 MG tablet Take 2 tablets (1,000 mg total) by mouth every 6 (six) hours as needed for mild pain (for pain scale < 4  OR  temperature  >/=  100.5 F). 100 tablet 0 04/12/2019 at Unknown time  . docusate sodium (COLACE) 100 MG capsule Take 1 capsule (100 mg  total) by mouth daily. 10 capsule 0 04/12/2019 at Unknown time  . oxyCODONE (ROXICODONE) 5 MG immediate release tablet Take 1 tablet (5 mg total) by mouth every 6 (six) hours as needed for up to 3 days. 10 tablet 0 04/12/2019 at Unknown time  . Prenatal Vit-Fe Fumarate-FA (MULTIVITAMIN-PRENATAL) 27-0.8 MG TABS tablet Take 1 tablet by mouth daily at 12 noon.   04/12/2019 at Unknown time  . tamsulosin (FLOMAX) 0.4 MG CAPS capsule Take 1 capsule (0.4 mg total) by mouth daily after supper for 10 days. 10 capsule 0 04/12/2019 at Unknown time   Allergies: No Known Allergies  Family History  Problem Relation Age of Onset  . Basal cell carcinoma Mother   . Anxiety disorder Mother   . Drug abuse Father   . Peptic Ulcer Father   . Heart failure Maternal Grandfather   . Lung cancer Paternal Grandmother   . Colon cancer Neg Hx   . Cervical cancer Neg Hx   . Breast cancer Neg Hx    Social History:  reports that she has never smoked. She has never used smokeless tobacco. She reports that she does not drink alcohol or use drugs.  ROS: A complete review of systems was performed.  All systems are negative except for pertinent findings as noted. Review of Systems  All other systems reviewed and are negative.    Physical Exam:  Vital signs in last 24  hours: Temp:  [98.7 F (37.1 C)-99.3 F (37.4 C)] 98.7 F (37.1 C) (06/07 2038) Pulse Rate:  [88-116] 95 (06/07 2038) Resp:  [16-22] 22 (06/07 2038) BP: (100-122)/(54-73) 109/66 (06/07 2042) SpO2:  [100 %] 100 % (06/07 2038) Weight:  [75.8 kg] 75.8 kg (06/07 1634) General:  Alert and oriented, No acute distress HEENT: Normocephalic, atraumatic Cardiovascular: Regular rate and rhythm Lungs: Regular rate and effort Abdomen: Soft, nontender, nondistended, no abdominal masses, gravid uterus Back: Maybe mild left CVA tenderness Extremities: No edema Neurologic: Grossly intact   Laboratory Data:  Results for orders placed or performed during the  hospital encounter of 04/12/19 (from the past 24 hour(s))  Urinalysis, Routine w reflex microscopic     Status: Abnormal   Collection Time: 04/12/19  4:59 PM  Result Value Ref Range   Color, Urine YELLOW (A) YELLOW   APPearance CLOUDY (A) CLEAR   Specific Gravity, Urine 1.020 1.005 - 1.030   pH 5.0 5.0 - 8.0   Glucose, UA NEGATIVE NEGATIVE mg/dL   Hgb urine dipstick LARGE (A) NEGATIVE   Bilirubin Urine NEGATIVE NEGATIVE   Ketones, ur 80 (A) NEGATIVE mg/dL   Protein, ur 30 (A) NEGATIVE mg/dL   Nitrite NEGATIVE NEGATIVE   Leukocytes,Ua LARGE (A) NEGATIVE   RBC / HPF >50 (H) 0 - 5 RBC/hpf   WBC, UA >50 (H) 0 - 5 WBC/hpf   Bacteria, UA FEW (A) NONE SEEN   Squamous Epithelial / LPF 0-5 0 - 5   WBC Clumps PRESENT    Mucus PRESENT   SARS Coronavirus 2 (CEPHEID - Performed in [facility] hospital lab), Hosp Order     Status: None   Collection Time: 04/12/19  4:59 PM  Result Value Ref Range   SARS Coronavirus 2 NEGATIVE NEGATIVE  CBC on admission     Status: Abnormal   Collection Time: 04/12/19  5:30 PM  Result Value Ref Range   WBC 18.0 (H) 4.0 - 10.5 K/uL   RBC 3.23 (L) 3.87 - 5.11 MIL/uL   Hemoglobin 10.2 (L) 12.0 - 15.0 g/dL   HCT 29.3 (L) 36.0 - 46.0 %   MCV 90.7 80.0 - 100.0 fL   MCH 31.6 26.0 - 34.0 pg   MCHC 34.8 30.0 - 36.0 g/dL   RDW 11.9 11.5 - 15.5 %   Platelets 166 150 - 400 K/uL   nRBC 0.0 0.0 - 0.2 %  Comprehensive metabolic panel     Status: Abnormal   Collection Time: 04/12/19  5:30 PM  Result Value Ref Range   Sodium 132 (L) 135 - 145 mmol/L   Potassium 3.5 3.5 - 5.1 mmol/L   Chloride 104 98 - 111 mmol/L   CO2 18 (L) 22 - 32 mmol/L   Glucose, Bld 95 70 - 99 mg/dL   BUN 9 6 - 20 mg/dL   Creatinine, Ser 0.70 0.44 - 1.00 mg/dL   Calcium 8.3 (L) 8.9 - 10.3 mg/dL   Total Protein 6.5 6.5 - 8.1 g/dL   Albumin 3.3 (L) 3.5 - 5.0 g/dL   AST 17 15 - 41 U/L   ALT 13 0 - 44 U/L   Alkaline Phosphatase 93 38 - 126 U/L   Total Bilirubin 0.7 0.3 - 1.2 mg/dL   GFR calc  non Af Amer >60 >60 mL/min   GFR calc Af Amer >60 >60 mL/min   Anion gap 10 5 - 15  Sample to Blood Bank     Status: None   Collection  Time: 04/12/19  5:31 PM  Result Value Ref Range   Blood Bank Specimen SAMPLE AVAILABLE FOR TESTING    Sample Expiration      04/15/2019,2359 Performed at [facility], [address] [place], [place] [address]    Recent Results (from the past 240 hour(s))  Wet prep, genital     Status: Abnormal   Collection Time: 04/11/19  6:35 PM  Result Value Ref Range Status   Yeast Wet Prep HPF POC NONE SEEN NONE SEEN Final   Trich, Wet Prep NONE SEEN NONE SEEN Final   Clue Cells Wet Prep HPF POC NONE SEEN NONE SEEN Final   WBC, Wet Prep HPF POC FEW (A) NONE SEEN Final   Sperm NONE SEEN  Final    Comment: Performed at [facility], [address], [place], [place] [address]  [place] rt PCR [facility] only)     Status: None   Collection Time: 04/11/19  6:35 PM  Result Value Ref Range Status   Specimen source GC/Chlam ENDOCERVICAL  Final   Chlamydia Tr NOT DETECTED NOT DETECTED Final   N gonorrhoeae NOT DETECTED NOT DETECTED Final    Comment: (NOTE) This CT/NG assay has not been evaluated in patients with a history of  hysterectomy. Performed at [facility], [address], [place], [place] [address]   SARS Coronavirus 2 (CEPHEID - Performed in [facility] hospital lab), Hosp Order     Status: None   Collection Time: 04/12/19  4:59 PM  Result Value Ref Range Status   SARS Coronavirus 2 NEGATIVE NEGATIVE Final    Comment: (NOTE) If result is NEGATIVE SARS-CoV-2 target nucleic acids are NOT DETECTED. The SARS-CoV-2 RNA is generally detectable in upper and lower  respiratory specimens during the acute phase of infection. The lowest  concentration of SARS-CoV-2 viral copies this assay can detect is 250  copies / mL. A negative result does not preclude SARS-CoV-2 infection  and should not be used as the sole basis for  treatment or other  patient management decisions.  A negative result may occur with  improper specimen collection / handling, submission of specimen other  than nasopharyngeal swab, presence of viral mutation(s) within the  areas targeted by this assay, and inadequate number of viral copies  (<250 copies / mL). A negative result must be combined with clinical  observations, patient history, and epidemiological information. If result is POSITIVE SARS-CoV-2 target nucleic acids are DETECTED. The SARS-CoV-2 RNA is generally detectable in upper and lower  respiratory specimens dur ing the acute phase of infection.  Positive  results are indicative of active infection with SARS-CoV-2.  Clinical  correlation with patient history and other diagnostic information is  necessary to determine patient infection status.  Positive results do  not rule out bacterial infection or co-infection with other viruses. If result is PRESUMPTIVE POSTIVE SARS-CoV-2 nucleic acids MAY BE PRESENT.   A presumptive positive result was obtained on the submitted specimen  and confirmed on repeat testing.  While 2019 novel coronavirus  (SARS-CoV-2) nucleic acids may be present in the submitted sample  additional confirmatory testing may be necessary for epidemiological  and / or clinical management purposes  to differentiate between  SARS-CoV-2 and other Sarbecovirus currently known to infect humans.  If clinically indicated additional testing with an alternate test  methodology [phone]) is advised. The SARS-CoV-2 RNA is generally  detectable in upper and lower respiratory sp ecimens during the acute  phase of infection. The expected  result is Negative. Fact Sheet for Patients:  StrictlyIdeas.no Fact Sheet for Healthcare Providers: BankingDealers.co.za This test is not yet approved or cleared by the [place] FDA and has been authorized for detection and/or  diagnosis of SARS-CoV-2 by FDA under an Emergency Use Authorization (EUA).  This EUA will remain in effect (meaning this test can be used) for the duration of the COVID-19 declaration under Section 564(b)(1) of the Act, 21 U.S.C. section 360bbb-3(b)(1), unless the authorization is terminated or revoked sooner. Performed at [facility], [address] [place], [place] [address]    Creatinine: Recent Labs    04/12/19 1730  CREATININE 0.70    Impression/Assessment/plan:  Left hydronephrosis- we do not know the etiology.  Possible pyelo or stone or compression from baby, however, she is doing well.  We discussed if it is a stone most of those will pass but do sometimes require multiple admissions with supportive care.  She would not need a stent or nephrostomy unless the hydro-persisted or worsened and she had fever.  A stent is tricky because it would require multiple changes because it can encrust quickly.  She is doing well, so I would continue antibiotics, fluids, pain meds and tamsulosin.  If her pain improves, white count improves and she remains afebrile could likely go home.  I would recommend a follow-up ultrasound may be in the next week or 2.  Festus Aloe 04/12/2019, 10:13 PM

## 2019-04-13 DIAGNOSIS — N201 Calculus of ureter: Secondary | ICD-10-CM | POA: Diagnosis not present

## 2019-04-13 LAB — CBC WITH DIFFERENTIAL/PLATELET
Abs Immature Granulocytes: 0.05 10*3/uL (ref 0.00–0.07)
Basophils Absolute: 0 10*3/uL (ref 0.0–0.1)
Basophils Relative: 0 %
Eosinophils Absolute: 0.1 10*3/uL (ref 0.0–0.5)
Eosinophils Relative: 1 %
HCT: 26.4 % — ABNORMAL LOW (ref 36.0–46.0)
Hemoglobin: 8.8 g/dL — ABNORMAL LOW (ref 12.0–15.0)
Immature Granulocytes: 1 %
Lymphocytes Relative: 12 %
Lymphs Abs: 1.3 10*3/uL (ref 0.7–4.0)
MCH: 30.8 pg (ref 26.0–34.0)
MCHC: 33.3 g/dL (ref 30.0–36.0)
MCV: 92.3 fL (ref 80.0–100.0)
Monocytes Absolute: 0.8 10*3/uL (ref 0.1–1.0)
Monocytes Relative: 7 %
Neutro Abs: 8.4 10*3/uL — ABNORMAL HIGH (ref 1.7–7.7)
Neutrophils Relative %: 79 %
Platelets: 167 10*3/uL (ref 150–400)
RBC: 2.86 MIL/uL — ABNORMAL LOW (ref 3.87–5.11)
RDW: 11.9 % (ref 11.5–15.5)
WBC: 10.6 10*3/uL — ABNORMAL HIGH (ref 4.0–10.5)
nRBC: 0 % (ref 0.0–0.2)

## 2019-04-13 LAB — COMPREHENSIVE METABOLIC PANEL
ALT: 11 U/L (ref 0–44)
AST: 15 U/L (ref 15–41)
Albumin: 2.6 g/dL — ABNORMAL LOW (ref 3.5–5.0)
Alkaline Phosphatase: 80 U/L (ref 38–126)
Anion gap: 5 (ref 5–15)
BUN: 6 mg/dL (ref 6–20)
CO2: 23 mmol/L (ref 22–32)
Calcium: 7.9 mg/dL — ABNORMAL LOW (ref 8.9–10.3)
Chloride: 107 mmol/L (ref 98–111)
Creatinine, Ser: 0.59 mg/dL (ref 0.44–1.00)
GFR calc Af Amer: >60 mL/min (ref >60)
GFR calc non Af Amer: >60 mL/min (ref >60)
Glucose, Bld: 102 mg/dL — ABNORMAL HIGH (ref 70–99)
Potassium: 3.4 mmol/L — ABNORMAL LOW (ref 3.5–5.1)
Sodium: 135 mmol/L (ref 135–145)
Total Bilirubin: 0.4 mg/dL (ref 0.3–1.2)
Total Protein: 5.5 g/dL — ABNORMAL LOW (ref 6.5–8.1)

## 2019-04-13 LAB — URINE CULTURE: Culture: 40000 — AB

## 2019-04-13 MED ORDER — ONDANSETRON HCL 4 MG PO TABS: ORAL_TABLET | ORAL | 1 refills | Status: DC

## 2019-04-13 MED ORDER — OXYCODONE HCL 5 MG PO TABS: 5.0000 mg | ORAL_TABLET | ORAL | Status: DC | PRN

## 2019-04-13 MED ORDER — NITROFURANTOIN MONOHYD MACRO 100 MG PO CAPS: 100.0000 mg | ORAL_CAPSULE | Freq: Two times a day (BID) | ORAL | 0 refills | Status: AC

## 2019-04-13 MED ORDER — ONDANSETRON 4 MG PO TBDP
4.0000 mg | ORAL_TABLET | Freq: Four times a day (QID) | ORAL | Status: DC | PRN
  Administered 2019-04-13: 4 mg via ORAL
  Filled 2019-04-13: qty 1

## 2019-04-13 NOTE — Discharge Summary (Signed)
Physician Discharge Summary  Patient ID: VERTIE DIBBERN MRN: [id number] DOB/AGE: 25-Aug-1996 23 y.o.  Admit date: 04/12/2019 Discharge date: 04/13/2019  Admission Diagnoses:[redacted] week gestation , left flank pain   Discharge Diagnoses:  Active Problems:   Flank pain in pregnant patient left urolithiasis- passed   Discharged Condition: good  Hospital Course: pt admitted with left pain and [redacted] weeks EGA . Pt with prior urolithiasis. IVF and Flomax use and stone passed . Pain improved much. Reactive fetal NST on day of D/C . Urology consult done    Results for orders placed or performed during the hospital encounter of 04/12/19 (from the past 24 hour(s))  CBC with Differential/Platelet     Status: Abnormal   Collection Time: 04/13/19  6:12 AM  Result Value Ref Range   WBC 10.6 (H) 4.0 - 10.5 K/uL   RBC 2.86 (L) 3.87 - 5.11 MIL/uL   Hemoglobin 8.8 (L) 12.0 - 15.0 g/dL   HCT 26.4 (L) 36.0 - 46.0 %   MCV 92.3 80.0 - 100.0 fL   MCH 30.8 26.0 - 34.0 pg   MCHC 33.3 30.0 - 36.0 g/dL   RDW 11.9 11.5 - 15.5 %   Platelets 167 150 - 400 K/uL   nRBC 0.0 0.0 - 0.2 %   Neutrophils Relative % 79 %   Neutro Abs 8.4 (H) 1.7 - 7.7 K/uL   Lymphocytes Relative 12 %   Lymphs Abs 1.3 0.7 - 4.0 K/uL   Monocytes Relative 7 %   Monocytes Absolute 0.8 0.1 - 1.0 K/uL   Eosinophils Relative 1 %   Eosinophils Absolute 0.1 0.0 - 0.5 K/uL   Basophils Relative 0 %   Basophils Absolute 0.0 0.0 - 0.1 K/uL   Immature Granulocytes 1 %   Abs Immature Granulocytes 0.05 0.00 - 0.07 K/uL  Comprehensive metabolic panel     Status: Abnormal   Collection Time: 04/13/19  6:12 AM  Result Value Ref Range   Sodium 135 135 - 145 mmol/L   Potassium 3.4 (L) 3.5 - 5.1 mmol/L   Chloride 107 98 - 111 mmol/L   CO2 23 22 - 32 mmol/L   Glucose, Bld 102 (H) 70 - 99 mg/dL   BUN 6 6 - 20 mg/dL   Creatinine, Ser 0.59 0.44 - 1.00 mg/dL   Calcium 7.9 (L) 8.9 - 10.3 mg/dL   Total Protein 5.5 (L) 6.5 - 8.1 g/dL   Albumin 2.6 (L) 3.5  - 5.0 g/dL   AST 15 15 - 41 U/L   ALT 11 0 - 44 U/L   Alkaline Phosphatase 80 38 - 126 U/L   Total Bilirubin 0.4 0.3 - 1.2 mg/dL   GFR calc non Af Amer >60 >60 mL/min   GFR calc Af Amer >60 >60 mL/min   Anion gap 5 5 - 15   RENAL / URINARY TRACT ULTRASOUND COMPLETE  COMPARISON:  None.  FINDINGS: Right Kidney:  Renal measurements: 12.7 x 5.3 x 5.1 cm = volume: 181 mL . Echogenicity within normal limits. No mass or stones visualized. Minimal prominence of the right intrarenal collecting system.  Left Kidney:  Renal measurements: 13.0 x 5.4 x 5.5 cm = volume: 203 mL. Echogenicity within normal limits. No mass or stones visualized. Mild-to-moderate left-sided hydronephrosis.  Bladder:  Appears normal for degree of bladder distention. Gravid uterus noted.  IMPRESSION: Normal size kidneys with mild-to-moderate left-sided hydronephrosis and mild prominence of the right intrarenal collecting system. No mass or stones visualized.  Consults: urology  Significant Diagnostic Studies: renal u/s   Treatments: IV hydration, flomax  Discharge Exam: Blood pressure 109/60, pulse 95, temperature 98.3 F (36.8 C), temperature source Oral, resp. rate 19, height 5\' 3"  (1.6 m), weight 75.8 kg, last menstrual period 09/22/2018, SpO2 100 %.  No flank CVAT  Abd : soft gravid , NT     Disposition: Discharge disposition: 01-Home or Self Care       Discharge Instructions    Call MD for:   Complete by:  As directed    Prn flank pain   Call MD for:  difficulty breathing, headache or visual disturbances   Complete by:  As directed    Call MD for:  extreme fatigue   Complete by:  As directed    Call MD for:  hives   Complete by:  As directed    Call MD for:  persistant dizziness or light-headedness   Complete by:  As directed    Call MD for:  persistant nausea and vomiting   Complete by:  As directed    Call MD for:  redness, tenderness, or signs of infection (pain,  swelling, redness, odor or green/yellow discharge around incision site)   Complete by:  As directed    Call MD for:  severe uncontrolled pain   Complete by:  As directed    Call MD for:  temperature >100.4   Complete by:  As directed    Diet - low sodium heart healthy   Complete by:  As directed    Increase activity slowly   Complete by:  As directed      Allergies as of 04/13/2019   No Known Allergies     Medication List    STOP taking these medications   multivitamin-prenatal 27-0.8 MG Tabs tablet   oxyCODONE 5 MG immediate release tablet Commonly known as:  Roxicodone   tamsulosin 0.4 MG Caps capsule Commonly known as:  FLOMAX     TAKE these medications   acetaminophen 500 MG tablet Commonly known as:  TYLENOL Take 2 tablets (1,000 mg total) by mouth every 6 (six) hours as needed for mild pain (for pain scale < 4  OR  temperature  >/=  100.5 F).   docusate sodium 100 MG capsule Commonly known as:  COLACE Take 1 capsule (100 mg total) by mouth daily.   nitrofurantoin (macrocrystal-monohydrate) 100 MG capsule Commonly known as:  Macrobid Take 1 capsule (100 mg total) by mouth 2 (two) times daily for 7 days.   ondansetron 4 MG tablet Commonly known as:  Zofran 1-2 po q 6 hrs prn nausea      Follow-up Information    Hollice Espy, MD.   Specialty:  Urology Why:  following delivery Contact information: [address] [place] [address] [phone]          Follow up in [place] in 1 weeks   Signed: Gwen Her Schermerhorn 04/13/2019, 5:57 PM

## 2019-04-13 NOTE — Progress Notes (Signed)
Patient ID: Patricia Pearson, female   DOB: 05-20-1996, 23 y.o.   MRN: [id number] NST today - reactive , no decels , FHR 140 , good variability

## 2019-04-13 NOTE — Progress Notes (Signed)
Pt discharged home.  Discharge instructions, prescriptions and follow up appointment given to and reviewed with pt.  Pt verbalized understanding.  Escorted by staff. 

## 2019-04-13 NOTE — Progress Notes (Signed)
Urology Consult Follow Up  Subjective: No further flank pain since approximately 11 PM when she passed a small stone.  Feeling well this morning.  Complains of some mild lower abdominal pressure but no overt pain.  No nausea or vomiting.  Anti-infectives: Anti-infectives (From admission, onward)   Start     Dose/Rate Route Frequency Ordered Stop   04/12/19 2300  cefTRIAXone (ROCEPHIN) 1 g in sodium chloride 0.9 % 100 mL IVPB     1 g 200 mL/hr over 30 Minutes Intravenous Every 12 hours 04/12/19 2252        Current Facility-Administered Medications  Medication Dose Route Frequency Provider Last Rate Last Dose  . 0.9 %  sodium chloride infusion   Intravenous PRN McVey, Murray Hodgkins, CNM   Stopped at 04/13/19 [phone]  . acetaminophen (TYLENOL) tablet 650 mg  650 mg Oral Q4H PRN McVey, Rebecca A, CNM   650 mg at 04/13/19 0654  . calcium carbonate (TUMS - dosed in mg elemental calcium) chewable tablet 400 mg of elemental calcium  2 tablet Oral Q4H PRN McVey, Murray Hodgkins, CNM      . cefTRIAXone (ROCEPHIN) 1 g in sodium chloride 0.9 % 100 mL IVPB  1 g Intravenous Q12H McVey, Rebecca A, CNM   Stopped at 04/13/19 0951  . docusate sodium (COLACE) capsule 100 mg  100 mg Oral Daily McVey, Rebecca A, CNM   100 mg at 04/13/19 0917  . lactated ringers infusion   Intravenous Continuous McVey, Murray Hodgkins, CNM 125 mL/hr at 04/13/19 1000    . morphine 2 MG/ML injection 2 mg  2 mg Intravenous Q2H PRN McVey, Rebecca A, CNM   2 mg at 04/12/19 2353  . ondansetron (ZOFRAN) injection 4 mg  4 mg Intravenous Q6H PRN McVey, Rebecca A, CNM   4 mg at 04/12/19 1704  . ondansetron (ZOFRAN-ODT) disintegrating tablet 4 mg  4 mg Oral Q6H PRN McVey, Rebecca A, CNM   4 mg at 04/13/19 0917  . oxyCODONE (Oxy IR/ROXICODONE) immediate release tablet 5 mg  5 mg Oral Q4H PRN McVey, Murray Hodgkins, CNM      . prenatal multivitamin tablet 1 tablet  1 tablet Oral Q1200 McVey, Rebecca A, CNM   1 tablet at 04/13/19 1158  . tamsulosin (FLOMAX) capsule  0.4 mg  0.4 mg Oral QPC supper McVey, Murray Hodgkins, CNM      . zolpidem (AMBIEN) tablet 5 mg  5 mg Oral QHS PRN McVey, Rebecca A, CNM         Objective: Vital signs in last 24 hours: Temp:  [97.9 F (36.6 C)-99.3 F (37.4 C)] 97.9 F (36.6 C) (06/08 1148) Pulse Rate:  [87-116] 94 (06/08 1148) Resp:  [16-22] 20 (06/08 1148) BP: (100-122)/(54-73) 107/64 (06/08 1148) SpO2:  [100 %] 100 % (06/08 0833) Weight:  [75.8 kg] 75.8 kg (06/07 1634)  Intake/Output from previous day: 06/07 0701 - 06/08 0700 In: 1235.8 [I.V.:1135.8; IV Piggyback:100] Out: 2703 [Urine:1850] Intake/Output this shift: Total I/O In: 1082.6 [P.O.:120; I.V.:862.6; IV Piggyback:100] Out: 800 [Urine:800]   Physical Exam  No acute distress, alert and oriented x3 Gravid abdomen/pelvis  Lab Results:  Recent Labs    04/12/19 1730 04/13/19 0612  WBC 18.0* 10.6*  HGB 10.2* 8.8*  HCT 29.3* 26.4*  PLT 166 167   BMET Recent Labs    04/12/19 1730 04/13/19 0612  NA 132* 135  K 3.5 3.4*  CL 104 107  CO2 18* 23  GLUCOSE 95 102*  BUN 9 6  CREATININE 0.70 0.59  CALCIUM 8.3* 7.9*   PT/INR No results for input(s): LABPROT, INR in the last 72 hours. ABG No results for input(s): PHART, HCO3 in the last 72 hours.  Invalid input(s): PCO2, PO2  Studies/Results: US Renal  Result Date: 04/12/2019 CLINICAL DATA:  Acute left flank pain 2 days. Patient [redacted] weeks pregnant. EXAM: RENAL / URINARY TRACT ULTRASOUND COMPLETE COMPARISON:  None. FINDINGS: Right Kidney: Renal measurements: 12.7 x 5.3 x 5.1 cm = volume: 181 mL . Echogenicity within normal limits. No mass or stones visualized. Minimal prominence of the right intrarenal collecting system. Left Kidney: Renal measurements: 13.0 x 5.4 x 5.5 cm = volume: 203 mL. Echogenicity within normal limits. No mass or stones visualized. Mild-to-moderate left-sided hydronephrosis. Bladder: Appears normal for degree of bladder distention. Gravid uterus noted. IMPRESSION: Normal size  kidneys with mild-to-moderate left-sided hydronephrosis and mild prominence of the right intrarenal collecting system. No mass or stones visualized. Electronically Signed   By: Marin Olp M.D.   On: 04/12/2019 19:04     Assessment: 23 year old female with left flank pain with interval passage of the left ureteral calculus.  Clinically well.  Leukocytosis has resolved.  Plan: -Stone to be sent for stone analysis, order placed -Continue to encourage adequate hydration -We will treat for presumed UTI as a precaution although urine culture from 6/6 most consistent with contamination -Recommend outpatient follow-up following the delivery of this child further evaluation of kidney stones which can be either with myself or at [facility] urology where she is previously been followed, she is agreeable this plan    LOS: 1 day    Hollice Espy 04/13/2019

## 2019-04-13 NOTE — Progress Notes (Signed)
Subjective:left flank pain , improved after passing stone earlier . Dr Erlene Quan feels pt is cleared for d/c by urology standpoint   Patient reports pain better . Some Nausea .    Objective: I have reviewed patient's vital signs, medications and labs.  back - no CVAT   Assessment/Plan: 29 week left kidney stone with prior evidence of hydronephrosis D/C home on MAcrobid x 7days  Zofran 8 mg po q 6 hr [prn  Precautions given to the pt   LOS: 1 day    Patricia Pearson 04/13/2019, 5:39 PM

## 2019-04-13 NOTE — Progress Notes (Addendum)
Pt passed stone. Stone strained from urine and placed in sterile cup. Cup remains in bathroom incase any further stones passed. Instructed pt to cont to urinate in hat and notify nurse when done. Pt verbalized understanding. Will cont to monitor

## 2019-04-15 ENCOUNTER — Ambulatory Visit: Payer: 59

## 2019-04-22 ENCOUNTER — Ambulatory Visit: Payer: 59

## 2019-04-24 LAB — CALCULI, WITH PHOTOGRAPH (CLINICAL LAB)
Calcium Oxalate Dihydrate: 60 %
Hydroxyapatite: 40 %
Weight Calculi: 10 mg

## 2019-04-29 ENCOUNTER — Ambulatory Visit: Payer: 59

## 2019-05-06 ENCOUNTER — Ambulatory Visit: Payer: Commercial Managed Care - HMO | Attending: Obstetrics and Gynecology

## 2019-05-13 ENCOUNTER — Ambulatory Visit: Payer: Commercial Managed Care - HMO

## 2019-05-20 ENCOUNTER — Ambulatory Visit: Payer: Commercial Managed Care - HMO

## 2019-05-27 ENCOUNTER — Ambulatory Visit: Payer: Commercial Managed Care - HMO

## 2019-06-01 LAB — CHLAMYDIA/GC NAA, CONFIRMATION: Chlamydia trachomatis, NAA: NEGATIVE

## 2019-06-01 LAB — CHLAMYDIA SCREEN: Chlamydia Probe Amp: NEGATIVE

## 2019-06-03 ENCOUNTER — Ambulatory Visit: Payer: Commercial Managed Care - HMO

## 2019-06-07 ENCOUNTER — Encounter: Payer: Self-pay | Admitting: *Deleted

## 2019-06-07 ENCOUNTER — Other Ambulatory Visit: Payer: Self-pay

## 2019-06-07 ENCOUNTER — Observation Stay
Admission: EM | Admit: 2019-06-07 | Discharge: 2019-06-07 | Disposition: A | Discharge status: Home / Self Care | Payer: 59 | Attending: Obstetrics and Gynecology | Admitting: Obstetrics and Gynecology

## 2019-06-07 DIAGNOSIS — O479 False labor, unspecified: Secondary | ICD-10-CM | POA: Diagnosis present

## 2019-06-07 DIAGNOSIS — O26893 Other specified pregnancy related conditions, third trimester: Principal | ICD-10-CM | POA: Insufficient documentation

## 2019-06-07 DIAGNOSIS — R102 Pelvic and perineal pain: Secondary | ICD-10-CM | POA: Insufficient documentation

## 2019-06-07 DIAGNOSIS — Z3A36 36 weeks gestation of pregnancy: Secondary | ICD-10-CM | POA: Diagnosis not present

## 2019-06-07 NOTE — OB Triage Note (Addendum)
Discharge home. Left floor by self, ambulatory. Labor precautions discussed . Dineen Kid

## 2019-06-07 NOTE — Progress Notes (Signed)
Patient ID: Patricia Pearson, female   DOB: 1995-12-06, 23 y.o.   MRN: [id number]  Patricia Pearson is a 23 y.o. female. She is at [redacted]w[redacted]d gestation. Patient's last menstrual period was 09/22/2018. Estimated Date of Delivery: 06/29/19  Prenatal care site: [facility]  Chief complaint:ctx and some pelvic pain      Maternal Medical History:   Past Medical History:  Diagnosis Date  . Anemia   . Anxiety   . Kidney stone     Past Surgical History:  Procedure Laterality Date  . STENT PLACEMENT RT URETER ([place] HX) Right 04/09/2013  . WISDOM TOOTH EXTRACTION  2016    No Known Allergies  Prior to Admission medications   Medication Sig Start Date End Date Taking? Authorizing Provider  acetaminophen (TYLENOL) 500 MG tablet Take 2 tablets (1,000 mg total) by mouth every 6 (six) hours as needed for mild pain (for pain scale < 4  OR  temperature  >/=  100.5 F). 04/11/19  Yes McVey, Rebecca A, CNM  alum & mag hydroxide-simeth (MAALOX/MYLANTA) 200-200-20 MG/5ML suspension Take by mouth every 6 (six) hours as needed for indigestion or heartburn.   Yes [provider]  ferrous sulfate 325 (65 FE) MG tablet Take 325 mg by mouth daily with breakfast.   Yes [provider]  ondansetron (ZOFRAN) 4 MG tablet 1-2 po q 6 hrs prn nausea 04/13/19  Yes Loc Feinstein, Gwen Her, MD  Prenatal Vit-Fe Fumarate-FA (MULTIVITAMIN-PRENATAL) 27-0.8 MG TABS tablet Take 1 tablet by mouth daily at 12 noon.   Yes [provider]  docusate sodium (COLACE) 100 MG capsule Take 1 capsule (100 mg total) by mouth daily. Patient not taking: Reported on 06/07/2019 04/11/19   McVey, Murray Hodgkins, CNM     Social History: She  reports that she has never smoked. She has never used smokeless tobacco. She reports that she does not drink alcohol or use drugs.  Family History: family history includes Anxiety disorder in her mother; Basal cell carcinoma in her mother; Drug abuse in her father; Heart failure in  her maternal grandfather; Lung cancer in her paternal grandmother; Peptic Ulcer in her father. no history of gyn cancers  Review of Systems: A full review of systems was performed and negative except as noted in the HPI.   Review of Systems: A full review of systems was performed and negative except as noted in the HPI.   Eyes: no vision change  Ears: left ear pain  Oropharynx: no sore throat  Pulmonary . No shortness of breath , no hemoptysis Cardiovascular: no chest pain , no irregular heart beat  Gastrointestinal:no blood in stool . No diarrhea, no constipation Uro gynecologic: no dysuria , no pelvic pain Neurologic : no seizure , no migraines    Musculoskeletal: no muscular weakness  O:  BP 115/79   Pulse (!) 106   Temp 98.5 F (36.9 C) (Oral)   Resp 16   Ht 5\' 3"  (1.6 m)   Wt 76.7 kg   LMP 09/22/2018   BMI 29.94 kg/m  No results found for this or any previous visit (from the past 48 hour(s)).   Constitutional: NAD, AAOx3  HE/ENT: extraocular movements grossly intact, moist mucous membranes CV: RRR PULM: nl respiratory effort, CTABL     Abd: gravid, non-tender, non-distended, soft      Ext: Non-tender, Nonedmeatous   Psych: mood appropriate, speech normal Pelvic closed by RN  NST: reactive  Baseline: 150 Variability: moderate Accelerations present x >  2 Decelerations absent Time 21mins    Assessment: 23 y.o.  No labor  Reassuring fetal monitoring  Principle diagnosis:   Plan:  Labor: not present.   Fetal Wellbeing: Reassuring Cat 1 tracing.  Reactive NST   D/c home stable, precautions reviewed, follow-up as scheduled.   ----- Huel Cote MD Attending Obstetrician and Gynecologist [facility], Department of [facility]

## 2019-06-07 NOTE — Discharge Summary (Signed)
Patient ID: Patricia Pearson, female   DOB: 04/10/1996, 23 y.o.   MRN: [id number]  Subjective   Patricia Pearson is a 23 y.o. female. She is at [redacted]w[redacted]d gestation. Patient's last menstrual period was 09/22/2018. Estimated Date of Delivery: 06/29/19  Prenatal care site: [facility]  Chief complaint:ctx and some pelvic pain      Maternal Medical History:       Past Medical History:  Diagnosis Date  . Anemia   . Anxiety   . Kidney stone          Past Surgical History:  Procedure Laterality Date  . STENT PLACEMENT RT URETER ([place] HX) Right 04/09/2013  . WISDOM TOOTH EXTRACTION  2016    No Known Allergies         Prior to Admission medications   Medication Sig Start Date End Date Taking? Authorizing Provider  acetaminophen (TYLENOL) 500 MG tablet Take 2 tablets (1,000 mg total) by mouth every 6 (six) hours as needed for mild pain (for pain scale < 4  OR  temperature  >/=  100.5 F). 04/11/19  Yes McVey, Rebecca A, CNM  alum & mag hydroxide-simeth (MAALOX/MYLANTA) 200-200-20 MG/5ML suspension Take by mouth every 6 (six) hours as needed for indigestion or heartburn.   Yes [provider]  ferrous sulfate 325 (65 FE) MG tablet Take 325 mg by mouth daily with breakfast.   Yes [provider]  ondansetron (ZOFRAN) 4 MG tablet 1-2 po q 6 hrs prn nausea 04/13/19  Yes Mahlet Jergens, Gwen Her, MD  Prenatal Vit-Fe Fumarate-FA (MULTIVITAMIN-PRENATAL) 27-0.8 MG TABS tablet Take 1 tablet by mouth daily at 12 noon.   Yes [provider]  docusate sodium (COLACE) 100 MG capsule Take 1 capsule (100 mg total) by mouth daily. Patient not taking: Reported on 06/07/2019 04/11/19   McVey, Murray Hodgkins, CNM     Social History: She  reports that she has never smoked. She has never used smokeless tobacco. She reports that she does not drink alcohol or use drugs.  Family History: family history includes Anxiety disorder in her mother; Basal cell  carcinoma in her mother; Drug abuse in her father; Heart failure in her maternal grandfather; Lung cancer in her paternal grandmother; Peptic Ulcer in her father. no history of gyn cancers  Review of Systems: A full review of systems was performed and negative except as noted in the HPI.   Review of Systems: A full review of systems was performed and negativeexcept as noted in the HPI.  Eyes: no vision change  Ears: left ear pain  Oropharynx: no sore throat  Pulmonary . No shortness of breath , no hemoptysis Cardiovascular: no chest pain , no irregular heart beat  Gastrointestinal:no blood in stool . No diarrhea, no constipation Uro gynecologic: no dysuria , no pelvic pain Neurologic : no seizure , no migraines  Musculoskeletal: no muscular weakness  O:  Objective   BP 115/79   Pulse (!) 106   Temp 98.5 F (36.9 C) (Oral)   Resp 16   Ht 5\' 3"  (1.6 m)   Wt 76.7 kg   LMP 09/22/2018   BMI 29.94 kg/m  No results found for this or any previous visit (from the past 48 hour(s)).  Constitutional: NAD, AAOx3  HE/ENT: extraocular movements grossly intact, moist mucous membranes CV: RRR PULM: nl respiratory effort, CTABL  Abd: gravid, non-tender, non-distended, soft                                                  Ext: Non-tender, Nonedmeatous                     Psych: mood appropriate, speech normal Pelvic closed by RN  NST: reactive  Baseline: 150 Variability: moderate Accelerations present x >2 Decelerations absent Time 32mins    Assessment: 23 y.o.  No labor  Reassuring fetal monitoring  Principle diagnosis:   Plan:  Labor: not present.   Fetal Wellbeing: Reassuring Cat 1 tracing.  Reactive NST   D/c home stable, precautions reviewed, follow-up as scheduled.   ----- Huel Cote MD Attending Obstetrician and Gynecologist [facility], Department of [facility]          Electronically signed by Marea Reasner, Gwen Her, MD at 06/07/2019 5:13 PM

## 2019-06-07 NOTE — OB Triage Note (Signed)
Pt states she was feeling dizzy so she checked her blood pressure at home. Reports BP 130s/80s. Denies visual disturbances, epigastric pain, HA. Dineen Kid

## 2019-06-07 NOTE — OB Triage Note (Signed)
Next appointment is Thursday. Patricia Pearson

## 2019-06-15 ENCOUNTER — Inpatient Hospital Stay
Admission: EM | Admit: 2019-06-15 | Discharge: 2019-06-18 | DRG: 807 | Disposition: A | Discharge status: Home / Self Care | Payer: 59 | Attending: Obstetrics & Gynecology | Admitting: Obstetrics & Gynecology

## 2019-06-15 ENCOUNTER — Other Ambulatory Visit: Payer: Self-pay

## 2019-06-15 DIAGNOSIS — Z20828 Contact with and (suspected) exposure to other viral communicable diseases: Secondary | ICD-10-CM | POA: Diagnosis present

## 2019-06-15 DIAGNOSIS — D649 Anemia, unspecified: Secondary | ICD-10-CM | POA: Diagnosis present

## 2019-06-15 DIAGNOSIS — O9902 Anemia complicating childbirth: Secondary | ICD-10-CM | POA: Diagnosis present

## 2019-06-15 DIAGNOSIS — Z3A38 38 weeks gestation of pregnancy: Secondary | ICD-10-CM

## 2019-06-15 DIAGNOSIS — R6339 Other feeding difficulties: Secondary | ICD-10-CM

## 2019-06-15 DIAGNOSIS — O1410 Severe pre-eclampsia, unspecified trimester: Secondary | ICD-10-CM

## 2019-06-15 DIAGNOSIS — I361 Nonrheumatic tricuspid (valve) insufficiency: Secondary | ICD-10-CM | POA: Diagnosis not present

## 2019-06-15 DIAGNOSIS — R633 Feeding difficulties: Secondary | ICD-10-CM

## 2019-06-15 DIAGNOSIS — N12 Tubulo-interstitial nephritis, not specified as acute or chronic: Secondary | ICD-10-CM | POA: Diagnosis not present

## 2019-06-15 DIAGNOSIS — O1494 Unspecified pre-eclampsia, complicating childbirth: Principal | ICD-10-CM | POA: Diagnosis present

## 2019-06-15 DIAGNOSIS — R42 Dizziness and giddiness: Secondary | ICD-10-CM

## 2019-06-15 DIAGNOSIS — I340 Nonrheumatic mitral (valve) insufficiency: Secondary | ICD-10-CM | POA: Diagnosis not present

## 2019-06-15 DIAGNOSIS — R6520 Severe sepsis without septic shock: Secondary | ICD-10-CM | POA: Diagnosis not present

## 2019-06-15 DIAGNOSIS — R000 Tachycardia, unspecified: Secondary | ICD-10-CM | POA: Diagnosis not present

## 2019-06-15 DIAGNOSIS — A419 Sepsis, unspecified organism: Secondary | ICD-10-CM | POA: Diagnosis not present

## 2019-06-15 LAB — COMPREHENSIVE METABOLIC PANEL
ALT: 9 U/L (ref 0–44)
AST: 11 U/L — ABNORMAL LOW (ref 15–41)
Albumin: 2.8 g/dL — ABNORMAL LOW (ref 3.5–5.0)
Alkaline Phosphatase: 228 U/L — ABNORMAL HIGH (ref 38–126)
Anion gap: 9 (ref 5–15)
BUN: <5 mg/dL — ABNORMAL LOW (ref 6–20)
CO2: 23 mmol/L (ref 22–32)
Calcium: 8.9 mg/dL (ref 8.9–10.3)
Chloride: 104 mmol/L (ref 98–111)
Creatinine, Ser: 0.46 mg/dL (ref 0.44–1.00)
GFR calc Af Amer: >60 mL/min (ref >60)
GFR calc non Af Amer: >60 mL/min (ref >60)
Glucose, Bld: 85 mg/dL (ref 70–99)
Potassium: 3.9 mmol/L (ref 3.5–5.1)
Sodium: 136 mmol/L (ref 135–145)
Total Bilirubin: 0.4 mg/dL (ref 0.3–1.2)
Total Protein: 6.5 g/dL (ref 6.5–8.1)

## 2019-06-15 LAB — CBC
HCT: 29.2 % — ABNORMAL LOW (ref 36.0–46.0)
Hemoglobin: 9.6 g/dL — ABNORMAL LOW (ref 12.0–15.0)
MCH: 28.9 pg (ref 26.0–34.0)
MCHC: 32.9 g/dL (ref 30.0–36.0)
MCV: 88 fL (ref 80.0–100.0)
Platelets: 200 10*3/uL (ref 150–400)
RBC: 3.32 MIL/uL — ABNORMAL LOW (ref 3.87–5.11)
RDW: 13.2 % (ref 11.5–15.5)
WBC: 8.4 10*3/uL (ref 4.0–10.5)
nRBC: 0 % (ref 0.0–0.2)

## 2019-06-15 LAB — TYPE AND SCREEN
ABO/RH(D): O POS
Antibody Screen: NEGATIVE

## 2019-06-15 LAB — PROTEIN / CREATININE RATIO, URINE
Creatinine, Urine: 113 mg/dL
Protein Creatinine Ratio: 3.98 mg/mg{Cre} — ABNORMAL HIGH (ref 0.00–0.15)
Total Protein, Urine: 450 mg/dL

## 2019-06-15 LAB — SAMPLE TO BLOOD BANK

## 2019-06-15 LAB — TSH: TSH: 1.738 u[IU]/mL (ref 0.350–4.500)

## 2019-06-15 LAB — SARS CORONAVIRUS 2 BY RT PCR (HOSPITAL ORDER, PERFORMED IN ~~LOC~~ HOSPITAL LAB): SARS Coronavirus 2: NEGATIVE

## 2019-06-15 LAB — T4, FREE: Free T4: 0.77 ng/dL (ref 0.61–1.12)

## 2019-06-15 MED ORDER — LACTATED RINGERS IV BOLUS: 1000.0000 mL | Freq: Once | INTRAVENOUS | Status: DC

## 2019-06-15 MED ORDER — OXYCODONE-ACETAMINOPHEN 5-325 MG PO TABS: 2.0000 | ORAL_TABLET | ORAL | Status: DC | PRN

## 2019-06-15 MED ORDER — BUTORPHANOL TARTRATE 1 MG/ML IJ SOLN: 1.0000 mg | INTRAMUSCULAR | Status: DC | PRN

## 2019-06-15 MED ORDER — ONDANSETRON HCL 4 MG/2ML IJ SOLN
4.0000 mg | Freq: Four times a day (QID) | INTRAMUSCULAR | Status: DC | PRN
  Administered 2019-06-16: 4 mg via INTRAVENOUS
  Filled 2019-06-15: qty 2

## 2019-06-15 MED ORDER — OXYTOCIN BOLUS FROM INFUSION: 500.0000 mL | Freq: Once | INTRAVENOUS | Status: DC

## 2019-06-15 MED ORDER — AMMONIA AROMATIC IN INHA
RESPIRATORY_TRACT | Status: AC
  Filled 2019-06-15: qty 10

## 2019-06-15 MED ORDER — TERBUTALINE SULFATE 1 MG/ML IJ SOLN: 0.2500 mg | Freq: Once | INTRAMUSCULAR | Status: DC | PRN

## 2019-06-15 MED ORDER — ACETAMINOPHEN 325 MG PO TABS
650.0000 mg | ORAL_TABLET | ORAL | Status: DC | PRN
  Administered 2019-06-16: 650 mg via ORAL
  Filled 2019-06-15: qty 2

## 2019-06-15 MED ORDER — OXYTOCIN 10 UNIT/ML IJ SOLN
INTRAMUSCULAR | Status: AC
  Filled 2019-06-15: qty 2

## 2019-06-15 MED ORDER — LIDOCAINE HCL (PF) 1 % IJ SOLN
INTRAMUSCULAR | Status: AC
  Filled 2019-06-15: qty 30

## 2019-06-15 MED ORDER — LACTATED RINGERS IV SOLN
INTRAVENOUS | Status: DC
  Administered 2019-06-15 – 2019-06-16 (×2): via INTRAVENOUS

## 2019-06-15 MED ORDER — MAGNESIUM SULFATE 40 G IN LACTATED RINGERS - SIMPLE
2.0000 g/h | INTRAVENOUS | Status: DC
  Administered 2019-06-16 – 2019-06-17 (×2): 2 g/h via INTRAVENOUS
  Filled 2019-06-15 (×2): qty 500

## 2019-06-15 MED ORDER — LACTATED RINGERS IV SOLN
500.0000 mL | INTRAVENOUS | Status: DC | PRN
  Administered 2019-06-16: 500 mL via INTRAVENOUS

## 2019-06-15 MED ORDER — MISOPROSTOL 200 MCG PO TABS
ORAL_TABLET | ORAL | Status: AC
  Administered 2019-06-16: 25 ug via BUCCAL
  Filled 2019-06-15: qty 4

## 2019-06-15 MED ORDER — SOD CITRATE-CITRIC ACID 500-334 MG/5ML PO SOLN
30.0000 mL | ORAL | Status: DC | PRN
  Administered 2019-06-15 (×2): 30 mL via ORAL
  Filled 2019-06-15 (×2): qty 30

## 2019-06-15 MED ORDER — OXYCODONE-ACETAMINOPHEN 5-325 MG PO TABS: 1.0000 | ORAL_TABLET | ORAL | Status: DC | PRN

## 2019-06-15 MED ORDER — OXYTOCIN 40 UNITS IN NORMAL SALINE INFUSION - SIMPLE MED
1.0000 m[IU]/min | INTRAVENOUS | Status: DC
  Administered 2019-06-15: 4 m[IU]/min via INTRAVENOUS

## 2019-06-15 MED ORDER — MAGNESIUM SULFATE BOLUS VIA INFUSION
4.0000 g | Freq: Once | INTRAVENOUS | Status: AC
  Administered 2019-06-15: 19:00:00 4 g via INTRAVENOUS
  Filled 2019-06-15: qty 500

## 2019-06-15 MED ORDER — LACTATED RINGERS IV SOLN
INTRAVENOUS | Status: DC
  Administered 2019-06-15: 16:00:00 via INTRAVENOUS

## 2019-06-15 MED ORDER — LIDOCAINE HCL (PF) 1 % IJ SOLN: 30.0000 mL | INTRAMUSCULAR | Status: DC | PRN

## 2019-06-15 MED ORDER — OXYTOCIN 40 UNITS IN NORMAL SALINE INFUSION - SIMPLE MED
2.5000 [IU]/h | INTRAVENOUS | Status: DC
  Filled 2019-06-15: qty 1000

## 2019-06-15 NOTE — H&P (Signed)
Patricia Pearson is a 23 y.o. female presenting for dizziness , Headache for 5 days . Proteinuria with P/C ratio up from .681 to 3.98 BP normal  Neg GBS.  pregnancy complicated by ; 1.anemia - mild 2. Thrombocytopenia - resolved  3. RUQ pain  4.s<d - efw 06/01/19 = 46% 5. PAlpitation and sinus tachycardia  6. RUQ pain with nl u/s   OB History    Gravida  1   Para      Term      Preterm      AB      Living        SAB      TAB      Ectopic      Multiple      Live Births             Past Medical History:  Diagnosis Date  . Anemia   . Anxiety   . Kidney stone    Past Surgical History:  Procedure Laterality Date  . STENT PLACEMENT RT URETER ([place] HX) Right 04/09/2013  . WISDOM TOOTH EXTRACTION  2016   Family History: family history includes Anxiety disorder in her mother; Basal cell carcinoma in her mother; Drug abuse in her father; Heart failure in her maternal grandfather; Lung cancer in her paternal grandmother; Peptic Ulcer in her father. Social History:  reports that she has never smoked. She has never used smokeless tobacco. She reports that she does not drink alcohol or use drugs.     Maternal Diabetes: No Genetic Screening: Declined Maternal Ultrasounds/Referrals: Normal Fetal Ultrasounds or other Referrals:  None Maternal Substance Abuse:  No Significant Maternal Medications:  None Significant Maternal Lab Results:  Other:  Other Comments:  proteinuria   ROS  Review of Systems: A full review of systems was performed and negative except as noted in the HPI.   Eyes: no vision change  Ears: left ear pain  Oropharynx: no sore throat  Pulmonary . No shortness of breath , no hemoptysis Cardiovascular: no chest pain , no irregular heart beat  Gastrointestinal:no blood in stool . No diarrhea, no constipation Uro gynecologic: no dysuria , no pelvic pain Neurologic : no seizure , no migraines    Musculoskeletal: no muscular  weakness  History Dilation: 2 Effacement (%): 70 Station: -1 Exam by:: K.Maldonado,RN Blood pressure 116/71, pulse (!) 103, temperature 98.6 F (37 C), temperature source Oral, resp. rate 16, height 5\' 3"  (1.6 m), weight 75.3 kg, last menstrual period 09/22/2018. Exam Physical Exam  Lungs CTA   CV RRR  adb soft NT   Reflexes: patellar 3+ , no ankle clonus  cx 1-2/70/-1 VTX soft .  Prenatal labs: ABO, Rh: --/--/O POS Performed at [facility] [place], [address] [place], [place] [address]  [phone]/09 1758) Antibody: Negative (01/31 0000) Rubella: Immune (01/31 0000) RPR: Nonreactive (01/31 0000)  HBsAg: Negative (01/31 0000)  HIV: Non-reactive (01/31 0000)  GBS:   Neg   Covid [ ]   Assessment/Plan: Atypical Preeclampsia with significant and worsening proteinuria  Admit  Start Magnesium prophylaxis  IV pitocin     [name] January 06/15/2019, 6:40 PM

## 2019-06-15 NOTE — OB Triage Note (Signed)
Pt presents to L&D triage c/o dizziness and intermittent headache over the past 2 weeks.  Pt was seen in the office last week, was diagnosed with UTI had Augmentin 875mg  prescription sent to the pharmacy but pt has not yet started the course. Pt states she been hydrating, states she drinks about 6 bottles of water a day. Pt denies leaking of fluid or vaginal bleeding, states positive fetal movement. External monitors applied and assessing. Initial FHR 175 . VS WNL. T.Schermerhorn, MD aware of pt orders placed.

## 2019-06-15 NOTE — Progress Notes (Signed)
Patricia Pearson is a 23 y.o. G1P0 at [redacted]w[redacted]d  Pitocin 48mu/min Ctx regular  Subjective: Pain mild  Objective: BP 120/73 (BP Location: Left Arm)   Pulse 82   Temp 98.1 F (36.7 C) (Oral)   Resp 17   Ht 5\' 3"  (1.6 m)   Wt 75.3 kg   LMP 09/22/2018   SpO2 100%   BMI 29.41 kg/m  I/O last 3 completed shifts: In: 1416 [I.V.:1416] Out: -  Total I/O In: 1193.7 [P.O.:685; I.V.:508.7] Out: 450 [Urine:450]  FHT:  FHR: 130 bpm, variability: moderate,  accelerations:  Present,  decelerations:  Absent UC:   regular, every 3 minutes SVE:   Dilation: 2 Effacement (%): 70 Station: -1 Exam by:: Scermerhorn Md  Labs: Lab Results  Component Value Date   WBC 8.4 06/15/2019   HGB 9.6 (L) 06/15/2019   HCT 29.2 (L) 06/15/2019   MCV 88.0 06/15/2019   PLT 200 06/15/2019    Assessment / Plan: Induction Pitocin  No cx change   Cat 1 fetal monitoring  Cont magnesium and check level in am   Patricia Pearson 06/15/2019, 11:32 PM

## 2019-06-16 ENCOUNTER — Inpatient Hospital Stay: Payer: 59 | Admitting: Registered Nurse

## 2019-06-16 LAB — MAGNESIUM: Magnesium: 4 mg/dL — ABNORMAL HIGH (ref 1.7–2.4)

## 2019-06-16 MED ORDER — MISOPROSTOL 25 MCG QUARTER TABLET
25.0000 ug | ORAL_TABLET | Freq: Once | ORAL | Status: AC
  Administered 2019-06-16: 25 ug via BUCCAL
  Filled 2019-06-16: qty 1

## 2019-06-16 MED ORDER — BUPIVACAINE HCL (PF) 0.25 % IJ SOLN
INTRAMUSCULAR | Status: DC | PRN
  Administered 2019-06-16 (×2): 3 mL via EPIDURAL

## 2019-06-16 MED ORDER — FENTANYL 2.5 MCG/ML W/ROPIVACAINE 0.15% IN NS 100 ML EPIDURAL (ARMC)
EPIDURAL | Status: AC
  Filled 2019-06-16: qty 100

## 2019-06-16 MED ORDER — LABETALOL HCL 5 MG/ML IV SOLN
40.0000 mg | INTRAVENOUS | Status: DC | PRN
  Administered 2019-06-16: 40 mg via INTRAVENOUS
  Filled 2019-06-16: qty 8

## 2019-06-16 MED ORDER — LABETALOL HCL 5 MG/ML IV SOLN
INTRAVENOUS | Status: AC
  Filled 2019-06-16: qty 4

## 2019-06-16 MED ORDER — ACETAMINOPHEN 500 MG PO TABS
1000.0000 mg | ORAL_TABLET | Freq: Four times a day (QID) | ORAL | Status: DC | PRN
  Administered 2019-06-17: 1000 mg via ORAL
  Filled 2019-06-16: qty 2

## 2019-06-16 MED ORDER — FENTANYL 2.5 MCG/ML W/ROPIVACAINE 0.15% IN NS 100 ML EPIDURAL (ARMC)
EPIDURAL | Status: DC | PRN
  Administered 2019-06-16: 250 ug via EPIDURAL
  Administered 2019-06-16: 12 mL/h via EPIDURAL

## 2019-06-16 MED ORDER — LABETALOL HCL 5 MG/ML IV SOLN
20.0000 mg | INTRAVENOUS | Status: DC | PRN
  Administered 2019-06-16: 20 mg via INTRAVENOUS
  Filled 2019-06-16 (×6): qty 4

## 2019-06-16 MED ORDER — LABETALOL HCL 5 MG/ML IV SOLN
INTRAVENOUS | Status: AC
  Filled 2019-06-16: qty 8

## 2019-06-16 MED ORDER — OXYTOCIN 40 UNITS IN NORMAL SALINE INFUSION - SIMPLE MED
1.0000 m[IU]/min | INTRAVENOUS | Status: DC
  Administered 2019-06-16: 4 m[IU]/min via INTRAVENOUS

## 2019-06-16 MED ORDER — LIDOCAINE HCL (PF) 1 % IJ SOLN
INTRAMUSCULAR | Status: DC | PRN
  Administered 2019-06-16: 3 mL via SUBCUTANEOUS

## 2019-06-16 MED ORDER — LABETALOL HCL 5 MG/ML IV SOLN
80.0000 mg | INTRAVENOUS | Status: DC | PRN
  Filled 2019-06-16: qty 16

## 2019-06-16 MED ORDER — LIDOCAINE-EPINEPHRINE (PF) 1.5 %-1:200000 IJ SOLN
INTRAMUSCULAR | Status: DC | PRN
  Administered 2019-06-16: 3 mL via EPIDURAL

## 2019-06-16 MED ORDER — MISOPROSTOL 25 MCG QUARTER TABLET
25.0000 ug | ORAL_TABLET | Freq: Once | ORAL | Status: AC
  Administered 2019-06-16: 25 ug via VAGINAL
  Filled 2019-06-16: qty 1

## 2019-06-16 MED ORDER — ALUM & MAG HYDROXIDE-SIMETH 200-200-20 MG/5ML PO SUSP: 30.0000 mL | Freq: Four times a day (QID) | ORAL | Status: DC | PRN

## 2019-06-16 MED ORDER — PROCHLORPERAZINE EDISYLATE 10 MG/2ML IJ SOLN
10.0000 mg | Freq: Once | INTRAMUSCULAR | Status: DC
  Filled 2019-06-16: qty 2

## 2019-06-16 MED ORDER — HYDRALAZINE HCL 20 MG/ML IJ SOLN: 10.0000 mg | INTRAMUSCULAR | Status: DC | PRN

## 2019-06-16 NOTE — Anesthesia Preprocedure Evaluation (Signed)
Anesthesia Evaluation  Patient identified by MRN, date of birth, ID band Patient awake    Reviewed: Allergy & Precautions, H&P , NPO status , Patient's Chart, lab work & pertinent test results  Airway Mallampati: II  TM Distance: >3 FB Neck ROM: full    Dental no notable dental hx. (+) Teeth Intact   Pulmonary    Pulmonary exam normal        Cardiovascular hypertension, Normal cardiovascular exam     Neuro/Psych  Headaches, PSYCHIATRIC DISORDERS Anxiety    GI/Hepatic   Endo/Other    Renal/GU Renal disease (kidney stones)     Musculoskeletal   Abdominal   Peds  Hematology  (+) Blood dyscrasia, anemia ,   Anesthesia Other Findings   Reproductive/Obstetrics (+) Pregnancy                             Anesthesia Physical Anesthesia Plan  ASA: II  Anesthesia Plan: Epidural   Post-op Pain Management:    Induction:   PONV Risk Score and Plan:   Airway Management Planned:   Additional Equipment:   Intra-op Plan:   Post-operative Plan:   Informed Consent: I have reviewed the patients History and Physical, chart, labs and discussed the procedure including the risks, benefits and alternatives for the proposed anesthesia with the patient or authorized representative who has indicated his/her understanding and acceptance.     Dental Advisory Given  Plan Discussed with: Anesthesiologist and CRNA  Anesthesia Plan Comments:         Anesthesia Quick Evaluation

## 2019-06-16 NOTE — Progress Notes (Signed)
Labor Progress Note:  Nohea K Fishburn is a 23 y.o. G1P0 at [redacted]w[redacted]d by LMP c/w [redacted]w[redacted]d ultrasound admitted for induction of labor due to presumed atypical preeclampsia.  Subjective:  Resting in bed. Comfortable with epidural in place.   Objective: BP 104/67 (BP Location: Right Arm)   Pulse 89   Temp 99.1 F (37.3 C) (Oral)   Resp 16   Ht 5\' 3"  (1.6 m)   Wt 75.3 kg   LMP 09/22/2018   SpO2 100%   BMI 29.41 kg/m   Fetal Assessment: FHT:  FHR: 135 bpm, variability: moderate,  accelerations:  Present,  decelerations:  Present variable Category/reactivity:  Category II UC:  Uterine irritability SVE:   Dilation: 5-6cm  Effacement: 80%  Station:  -1  Consistency: soft  Position: middle  Membrane status: AROM now Amniotic color: clear  Labs: Lab Results  Component Value Date   WBC 8.4 06/15/2019   HGB 9.6 (L) 06/15/2019   HCT 29.2 (L) 06/15/2019   MCV 88.0 06/15/2019   PLT 200 06/15/2019    Assessment / Plan: Induction of labor, latent stage  Labor: Cook catheter out now. IUPC placed. Pitocin infusing at 15mu/min. Titrate to MVUs >200.  Preeclampsia:  on magnesium sulfate, no signs or symptoms of toxicity and labs stable Fetal Wellbeing:  Category II for rare variable decel, overall reassuring Pain Control:  Epidural Anticipated MOD:  NSVD  Lisette Grinder, CNM 06/16/2019, 7:49 PM

## 2019-06-16 NOTE — Progress Notes (Signed)
Labor Progress Note:  Patricia Pearson is a 23 y.o. G1P0 at [redacted]w[redacted]d by LMP c/w [redacted]w[redacted]d ultrasound admitted for induction of labor due to presumed atypical preeclampsia.  Subjective:  Resting in bed. Reports that contractions are starting to feel a little stronger than they had been. Still moderate in intensity.   Objective: BP 115/77 (BP Location: Left Arm)   Pulse 89   Temp 98.4 F (36.9 C) (Oral)   Resp 16   Ht 5\' 3"  (1.6 m)   Wt 75.3 kg   LMP 09/22/2018   SpO2 99%   BMI 29.41 kg/m   Fetal Assessment: FHT:  FHR: 120 bpm, variability: moderate,  accelerations:  Present,  decelerations:  Absent Category/reactivity:  Category I UC:   regular, every 1.5-3 minutes SVE:   Per RN Kandyce Rud at [phone] Dilation: 2cm  Effacement: 70%  Station:  -1  Consistency: medium  Position: middle  Membrane status: intact Amniotic color: n/a  Labs: Lab Results  Component Value Date   WBC 8.4 06/15/2019   HGB 9.6 (L) 06/15/2019   HCT 29.2 (L) 06/15/2019   MCV 88.0 06/15/2019   PLT 200 06/15/2019    Assessment / Plan: Induction of labor, latent stage  Labor: On 69mu/min pitocin for 8 hours with regular contractions and no cervical change. Plan to stop pitocin now, give misoprostol 71mcg buccal and 34mcg vaginal. Plan to reassess in 4 hours and potentially restart pitocin at that time. Discussed utility of IUPC with patient.  Preeclampsia:  on magnesium sulfate, no signs or symptoms of toxicity and labs stable Fetal Wellbeing:  Category I Pain Control:  Labor support without medications, epidural when desired Anticipated MOD:  NSVD  Lisette Grinder, CNM 06/16/2019, 8:18 AM

## 2019-06-16 NOTE — Progress Notes (Signed)
Labor Progress Note:  Patricia Pearson is a 23 y.o. G1P0 at 107w1d by LMP c/w [redacted]w[redacted]d ultrasound admitted for induction of labor due to presumed atypical preeclampsia.  Subjective:  Resting in bed. Reports that contractions are starting to feel a little stronger than they had been. Still moderate in intensity.   Objective: BP 114/77 (BP Location: Left Arm)   Pulse 91   Temp 98.4 F (36.9 C) (Oral)   Resp 16   Ht 5\' 3"  (1.6 m)   Wt 75.3 kg   LMP 09/22/2018   SpO2 100%   BMI 29.41 kg/m   Fetal Assessment: FHT:  FHR: 120 bpm, variability: moderate,  accelerations:  Present,  decelerations:  Absent Category/reactivity:  Category I UC:   irregular, every 3-6 minutes SVE:   Dilation: 3cm  Effacement: 70%  Station:  -1  Consistency: medium  Position: Posterior to patient's left  Membrane status: intact Amniotic color: n/a  Labs: Lab Results  Component Value Date   WBC 8.4 06/15/2019   HGB 9.6 (L) 06/15/2019   HCT 29.2 (L) 06/15/2019   MCV 88.0 06/15/2019   PLT 200 06/15/2019    Assessment / Plan: Induction of labor, latent stage  Labor: On pitocin overnight for almost 12 hours with no cervical change. Pitocin stopped earlier this morning with one dose of vaginal/buccal misoprostol given. Cook catheter placed now, with 25mL in uterine balloon and 21mL in vaginal balloon. Plan to restart high dose pitocin.  Preeclampsia:  on magnesium sulfate, no signs or symptoms of toxicity and labs stable Fetal Wellbeing:  Category I Pain Control:  Labor support without medications, epidural when desired Anticipated MOD:  NSVD  Lisette Grinder, CNM 06/16/2019, 1:19 PM

## 2019-06-16 NOTE — Anesthesia Procedure Notes (Signed)
Epidural Patient location during procedure: OB Start time: 06/16/2019 1:35 PM End time: 06/16/2019 1:55 PM  Staffing Anesthesiologist: Emmie Niemann, MD Resident/CRNA: Hedda Slade, CRNA Performed: resident/CRNA   Preanesthetic Checklist Completed: patient identified, site marked, surgical consent, pre-op evaluation, timeout performed, IV checked, risks and benefits discussed and monitors and equipment checked  Epidural Patient position: sitting Prep: ChloraPrep Patient monitoring: heart rate, continuous pulse ox and blood pressure Approach: midline Location: L3-L4 Injection technique: LOR saline  Needle:  Needle type: Tuohy  Needle gauge: 17 G Needle length: 9 cm and 9 Needle insertion depth: 8 cm Catheter type: closed end flexible Catheter size: 19 Gauge Catheter at skin depth: 11 cm Test dose: negative and 1.5% lidocaine with Epi 1:200 K  Assessment Events: blood not aspirated, injection not painful, no injection resistance, negative IV test and no paresthesia  Additional Notes 1 attempt Pt. Evaluated and documentation done after procedure finished. Patient identified. Risks/Benefits/Options discussed with patient including but not limited to bleeding, infection, nerve damage, paralysis, failed block, incomplete pain control, headache, blood pressure changes, nausea, vomiting, reactions to medication both or allergic, itching and postpartum back pain. Confirmed with bedside nurse the patient's most recent platelet count. Confirmed with patient that they are not currently taking any anticoagulation, have any bleeding history or any family history of bleeding disorders. Patient expressed understanding and wished to proceed. All questions were answered. Sterile technique was used throughout the entire procedure. Please see nursing notes for vital signs. Test dose was given through epidural catheter and negative prior to continuing to dose epidural or start infusion. Warning signs  of high block given to the patient including shortness of breath, tingling/numbness in hands, complete motor block, or any concerning symptoms with instructions to call for help. Patient was given instructions on fall risk and not to get out of bed. All questions and concerns addressed with instructions to call with any issues or inadequate analgesia.   Patient tolerated the insertion well without immediate complications.Reason for block:procedure for pain

## 2019-06-17 LAB — CBC
HCT: 26.7 % — ABNORMAL LOW (ref 36.0–46.0)
Hemoglobin: 8.6 g/dL — ABNORMAL LOW (ref 12.0–15.0)
MCH: 28.3 pg (ref 26.0–34.0)
MCHC: 32.2 g/dL (ref 30.0–36.0)
MCV: 87.8 fL (ref 80.0–100.0)
Platelets: 163 10*3/uL (ref 150–400)
RBC: 3.04 MIL/uL — ABNORMAL LOW (ref 3.87–5.11)
RDW: 13.1 % (ref 11.5–15.5)
WBC: 9.9 10*3/uL (ref 4.0–10.5)
nRBC: 0 % (ref 0.0–0.2)

## 2019-06-17 LAB — RPR: RPR Ser Ql: NONREACTIVE

## 2019-06-17 MED ORDER — DIPHENHYDRAMINE HCL 25 MG PO CAPS: 25.0000 mg | ORAL_CAPSULE | Freq: Four times a day (QID) | ORAL | Status: DC | PRN

## 2019-06-17 MED ORDER — PRENATAL MULTIVITAMIN CH
1.0000 | ORAL_TABLET | Freq: Every day | ORAL | Status: DC
  Administered 2019-06-17 – 2019-06-18 (×2): 1 via ORAL
  Filled 2019-06-17 (×2): qty 1

## 2019-06-17 MED ORDER — COCONUT OIL OIL: 1.0000 "application " | TOPICAL_OIL | Status: DC | PRN

## 2019-06-17 MED ORDER — DIBUCAINE (PERIANAL) 1 % EX OINT: 1.0000 "application " | TOPICAL_OINTMENT | CUTANEOUS | Status: DC | PRN

## 2019-06-17 MED ORDER — LACTATED RINGERS IV SOLN
INTRAVENOUS | Status: DC
  Administered 2019-06-17: 08:00:00 via INTRAVENOUS

## 2019-06-17 MED ORDER — ONDANSETRON HCL 4 MG/2ML IJ SOLN: 4.0000 mg | INTRAMUSCULAR | Status: DC | PRN

## 2019-06-17 MED ORDER — VARICELLA VIRUS VACCINE LIVE 1350 PFU/0.5ML IJ SUSR
0.5000 mL | Freq: Once | INTRAMUSCULAR | Status: AC
  Administered 2019-06-18: 0.5 mL via SUBCUTANEOUS
  Filled 2019-06-17 (×2): qty 0.5

## 2019-06-17 MED ORDER — BENZOCAINE-MENTHOL 20-0.5 % EX AERO
1.0000 "application " | INHALATION_SPRAY | CUTANEOUS | Status: DC | PRN
  Filled 2019-06-17: qty 56

## 2019-06-17 MED ORDER — IBUPROFEN 600 MG PO TABS
600.0000 mg | ORAL_TABLET | Freq: Four times a day (QID) | ORAL | Status: DC
  Administered 2019-06-17 – 2019-06-18 (×5): 600 mg via ORAL
  Filled 2019-06-17 (×6): qty 1

## 2019-06-17 MED ORDER — FERROUS SULFATE 325 (65 FE) MG PO TABS
325.0000 mg | ORAL_TABLET | Freq: Two times a day (BID) | ORAL | Status: DC
  Administered 2019-06-17 – 2019-06-18 (×2): 325 mg via ORAL
  Filled 2019-06-17 (×2): qty 1

## 2019-06-17 MED ORDER — ONDANSETRON HCL 4 MG PO TABS: 4.0000 mg | ORAL_TABLET | ORAL | Status: DC | PRN

## 2019-06-17 MED ORDER — CALCIUM GLUCONATE 10 % IV SOLN
INTRAVENOUS | Status: AC
  Filled 2019-06-17: qty 10

## 2019-06-17 MED ORDER — ACETAMINOPHEN 325 MG PO TABS: 650.0000 mg | ORAL_TABLET | ORAL | Status: DC | PRN

## 2019-06-17 MED ORDER — SENNOSIDES-DOCUSATE SODIUM 8.6-50 MG PO TABS
2.0000 | ORAL_TABLET | ORAL | Status: DC
  Administered 2019-06-18: 2 via ORAL
  Filled 2019-06-17: qty 2

## 2019-06-17 MED ORDER — SIMETHICONE 80 MG PO CHEW: 160.0000 mg | CHEWABLE_TABLET | ORAL | Status: DC | PRN

## 2019-06-17 MED ORDER — WITCH HAZEL-GLYCERIN EX PADS: 1.0000 "application " | MEDICATED_PAD | CUTANEOUS | Status: DC | PRN

## 2019-06-17 NOTE — Anesthesia Postprocedure Evaluation (Signed)
Anesthesia Post Note  Patient: Patricia Pearson  Procedure(s) Performed: AN AD HOC LABOR EPIDURAL  Patient location during evaluation: Mother Baby Anesthesia Type: Epidural Level of consciousness: awake and alert Pain management: pain level controlled Vital Signs Assessment: post-procedure vital signs reviewed and stable Respiratory status: spontaneous breathing, nonlabored ventilation and respiratory function stable Cardiovascular status: stable Postop Assessment: no headache, no backache and epidural receding Anesthetic complications: no     Last Vitals:  Vitals:   06/17/19 0546 06/17/19 0644  BP: 113/70 104/70  Pulse: 94 89  Resp:  18  Temp:    SpO2:  100%    Last Pain:  Vitals:   06/17/19 0330  TempSrc:   PainSc: Asleep                 Hedda Slade

## 2019-06-17 NOTE — Lactation Note (Signed)
This note was copied from a baby's chart. Lactation Consultation Note  Patient Name: Patricia Pearson [id number] Date: 06/17/2019 Reason for consult: Initial assessment  LC called to LDR1 to assist mom who is on Mag with breastfeeding. Baby boy Loreli Slot, has breastfeed 3 times since delivery in side-lying positions. Mom was interested in alternate positions. Reviewed with MOB breastfeeding basics: early feeding cues, breast and breast milk physiology, and position options. Assisted with helping mom sit up in bed, provided support pillows for cross cradle position for feeding on the right breast. Mom unswaddled [place], and he began to immediately cue. LC assisted mom with placing baby in cross cradle position, held breast tissue to assist with infant grasping the breast and held tissue until rhythmic sucking was shown.  Mom reported slight pinching sensation, LC pulled down bottom lip and mom reported pinching sensation resolved. Baby Loreli Slot was able to sustain latch once LC let go of breast tissue.  Mom reported feeling comfortable in position, with enough support for her arms and the baby.  Reviewed and educated mom on feeding behaviors of newborns less than 22 hours old, and expectations wet/stool diapers for day of life.  Mom had no other questions. LC encouraged her to call out for ongoing support with breastfeeding, and LC plans to follow-up with duration of current feed a little later.  Maternal Data Formula Feeding for Exclusion: No Has patient been taught Hand Expression?: Yes Does the patient have breastfeeding experience prior to this delivery?: No  Feeding Feeding Type: Breast Fed  LATCH Score Latch: Grasps breast easily, tongue down, lips flanged, rhythmical sucking.  Audible Swallowing: A few with stimulation  Type of Nipple: Everted at rest and after stimulation  Comfort (Breast/Nipple): Soft / non-tender  Hold (Positioning): Assistance needed to correctly position infant  at breast and maintain latch.  LATCH Score: 8  Interventions Interventions: Breast feeding basics reviewed;Assisted with latch;Hand express;Breast compression;Adjust position;Support pillows;Position options  Lactation Tools Discussed/Used     Consult Status Consult Status: Follow-up Date: 06/17/19 Follow-up type: [place] 06/17/2019, 10:34 AM

## 2019-06-17 NOTE — Discharge Summary (Signed)
Obstetric Discharge Summary   Patient Name: Patricia Pearson [facility] DOB: [birth year] MRN: [id number]  Date of Admission: 06/15/2019 Date of Delivery: 06/16/2019 Delivered by: Lisette Grinder, CNM Date of Discharge: 06/18/2019  Primary OB: [place]  SHF:[id number] last menstrual period was 09/22/2018. EDC Estimated Date of Delivery: 06/29/19 Gestational Age at Delivery: [redacted]w[redacted]d   Antepartum complications:  1. Varicella non-immune 2. Anemia on iron supplementation 3. RUQ/epigastric pain with N/V, gallbladder ultrasound negative 4. S<D, EFW by u/s 46% 5. Palpitations with sinus tachycarida 6. Thrombocytopenia, resolved 7. Proteinura  Admitting Diagnosis: Atypical preeclampsia  Secondary Diagnoses: Patient Active Problem List   Diagnosis Date Noted  . Indication for care or intervention related to labor and delivery 06/15/2019  . Dizziness 06/15/2019  . Preeclampsia 06/15/2019  . Uterine contractions during pregnancy 06/07/2019  . Flank pain in pregnant patient 04/12/2019  . Pelvic pressure in pregnancy, antepartum, third trimester 04/11/2019  . Anemia 03/04/2017  . Generalized anxiety disorder with panic attacks 03/04/2017  . Chronic tension-type headache, not intractable 09/06/2016  . Chronic right flank pain 12/07/2014  . History of kidney stones 12/07/2014  . History of hematuria 12/07/2014  . Menorrhagia 04/28/2014    Induction/Augmentation: AROM, Pitocin, Cytotec and Foley Balloon Complications: None Intrapartum complications/course: She presented to L&D with concern for atypical preeclampsia with severe features. She was given pitocin for 12 hours with no cervical change. One dose of misoprostol was given, followed by [facility] catheter placement and pitocin restarted. AROM later performed. Epidural placed. She progressed to complete and had a spontaneous vaginal birth of a live female over an intact perineum. The fetal head was delivered in direct OA position with  restitution to ROA. Two loops of loose nuchal cord, reduced on the perineum, and right nuchal hand. Anterior then posterior shoulders delivered spontaneously. Baby placed on mom's abdomen and attended to by transition RN and NNP. Cord clamped and cut after 30 second delay by father of the baby. Baby then brought to the warmer for stimulation. Quickly brought back to patient for skin to skin. Cord blood obtained for newborn labs. Placenta delivered spontaneously intact with 3VC. Uterine tone firm and bleeding small. IV pitocin given for hemorrhage prophylaxis. 2nd degree perineal laceration identified and repaired in the usual fasion.   Delivery Type: spontaneous vaginal delivery Anesthesia: epidural Placenta: spontaneous Laceration: 2nd degree perineal Episiotomy: none  Newborn Data: Live born female "D[place]" Birth Weight: 6 lb 6.7 oz (2910 g) APGAR: 7, 8  Newborn Delivery   Birth date/time: 06/16/2019 22:53:00 Delivery type: Vaginal, Spontaneous       Postpartum Course pt remained on Magnesium sulfate . BP elevated PP and normalized . Good urine output and h/a resolved . On d/c good urine output and normal BP  .  By time of discharge on PPD#2, her pain was controlled on oral pain medications; she had appropriate lochia and was ambulating, voiding without difficulty and tolerating regular diet.  She was deemed stable for discharge to home.       Labs: CBC Latest Ref Rng & Units 06/18/2019 06/17/2019 06/15/2019  WBC 4.0 - 10.5 K/uL 9.8 9.9 8.4  Hemoglobin 12.0 - 15.0 g/dL 8.3(L) 8.6(L) 9.6(L)  Hematocrit 36.0 - 46.0 % 26.2(L) 26.7(L) 29.2(L)  Platelets 150 - 400 K/uL 182 163 200   O POS  Physical exam:  BP 113/76 (BP Location: Left Arm)   Pulse 96   Temp 99.4 F (37.4 C) (Oral)   Resp 20   Ht 5\' 3"  (1.6 m)  Wt 75.3 kg   LMP 09/22/2018   SpO2 98%   Breastfeeding Unknown   BMI 29.41 kg/m  General: alert and no distress Pulm: normal respiratory effort Lochia:  appropriate Abdomen: soft, NT Uterine Fundus: firm, below umbilicus Extremities: No evidence of DVT seen on physical exam. No lower extremity edema.  Disposition: stable, discharge to home Baby Feeding: breastmilk Baby Disposition: home with mom  Contraception: POPs- micronor   Prenatal Labs:  Blood type/Rh O+  Antibody screen neg  Rubella Immune  Varicella Non-immune  RPR NR  HBsAg Neg  HIV NR  GC neg  Chlamydia neg  Genetic screening declined  1 hour GTT 83  3 hour GTT n/a  GBS negative   Rh Immune globulin given: n/a Rubella vaccine given: n/a Varicella vaccine given: ordered to be given postpartum Tdap vaccine given in AP or PP setting: 04/21/2019  Plan:  Annsley K Almond was discharged to home in good condition. Follow-up appointment at [place] with delivery provider in 6 weeks  Discharge Instructions: Per After Visit Summary. Activity: Advance as tolerated. Pelvic rest for 6 weeks.   Diet: Regular Discharge Medications: Allergies as of 06/18/2019   No Known Allergies     Medication List    STOP taking these medications   acetaminophen 500 MG tablet Commonly known as: TYLENOL   alum & mag hydroxide-simeth 245-809-98 MG/5ML suspension Commonly known as: MAALOX/MYLANTA   docusate sodium 100 MG capsule Commonly known as: COLACE   ferrous sulfate 325 (65 FE) MG tablet   ondansetron 4 MG tablet Commonly known as: Zofran     TAKE these medications   benzocaine-Menthol 20-0.5 % Aero Commonly known as: DERMOPLAST Apply 1 application topically as needed for irritation (perineal discomfort).   ibuprofen 600 MG tablet Commonly known as: ADVIL Take 1 tablet (600 mg total) by mouth every 6 (six) hours.   multivitamin-prenatal 27-0.8 MG Tabs tablet Take 1 tablet by mouth daily at 12 noon.   norethindrone 0.35 MG tablet Commonly known as: Ortho Micronor Take 1 tablet (0.35 mg total) by mouth daily.      Outpatient follow up:   Follow-up Information    Lisette Grinder, CNM. Schedule an appointment as soon as possible for a visit in 6 week(s).   Specialty: Certified Nurse Midwife Why: For routine postpartum visit Contact information: [address] [place] [address] [phone]            Signed: Laverta Baltimore MD

## 2019-06-17 NOTE — Lactation Note (Signed)
This note was copied from a baby's chart. Lactation Consultation Note  Patient Name: Patricia Pearson [id number] Date: 06/17/2019   [facility] checked in with MOB to see if there were any questions or concerns regarding breastfeeding and positioning.  Mom reports baby Patricia Pearson has been "spitty" over the last couple of hours, and had help by a nurse suction him.  Reassured mom that this can be normal for some baby's depending on the amount of fluid swallowed. Encouraged to keep baby upright, prone, skin to skin, and what to look for to suction. Mom had questions regarding positioning once home from the hospital. Provided education on different options, but importance of alignment of ear, shoulder, hip, nose to nipple, wide open mouth and flange lips. Encouraged her to ensure she feels to be in a comfortable position as well with use of back support and support pillows for additional support.  Provided guidance on anticipated cluster feeding around 24 hours, baby waking more frequently to feed, importance of putting him to breast on cue to assist with ensuring long-term adequate milk production.   Maternal Data    Feeding    LATCH Score                   Interventions    Lactation Tools Discussed/Used     Consult Status      Lavonia Drafts 06/17/2019, 3:23 PM

## 2019-06-18 ENCOUNTER — Other Ambulatory Visit: Payer: Self-pay

## 2019-06-18 ENCOUNTER — Encounter: Payer: Self-pay | Admitting: *Deleted

## 2019-06-18 ENCOUNTER — Emergency Department: Payer: 59

## 2019-06-18 ENCOUNTER — Inpatient Hospital Stay (HOSPITAL_COMMUNITY)
Admission: EM | Admit: 2019-06-18 | Discharge: 2019-06-23 | Disposition: A | Discharge status: Home / Self Care | Payer: 59 | Source: Home / Self Care | Attending: Obstetrics and Gynecology | Admitting: Obstetrics and Gynecology

## 2019-06-18 DIAGNOSIS — J9690 Respiratory failure, unspecified, unspecified whether with hypoxia or hypercapnia: Secondary | ICD-10-CM

## 2019-06-18 DIAGNOSIS — R509 Fever, unspecified: Secondary | ICD-10-CM

## 2019-06-18 DIAGNOSIS — O864 Pyrexia of unknown origin following delivery: Secondary | ICD-10-CM | POA: Diagnosis present

## 2019-06-18 DIAGNOSIS — O8629 Other urinary tract infection following delivery: Secondary | ICD-10-CM

## 2019-06-18 LAB — URINALYSIS, COMPLETE (UACMP) WITH MICROSCOPIC
Bilirubin Urine: NEGATIVE
Glucose, UA: NEGATIVE mg/dL
Ketones, ur: 5 mg/dL — AB
Nitrite: NEGATIVE
Protein, ur: 100 mg/dL — AB
RBC / HPF: >50 RBC/hpf — ABNORMAL HIGH (ref 0–5)
Specific Gravity, Urine: 1.017 (ref 1.005–1.030)
WBC, UA: >50 WBC/hpf — ABNORMAL HIGH (ref 0–5)
pH: 6 (ref 5.0–8.0)

## 2019-06-18 LAB — CBC WITH DIFFERENTIAL/PLATELET
Abs Immature Granulocytes: 0.09 10*3/uL — ABNORMAL HIGH (ref 0.00–0.07)
Basophils Absolute: 0 10*3/uL (ref 0.0–0.1)
Basophils Relative: 0 %
Eosinophils Absolute: 0 10*3/uL (ref 0.0–0.5)
Eosinophils Relative: 0 %
HCT: 25 % — ABNORMAL LOW (ref 36.0–46.0)
Hemoglobin: 8.1 g/dL — ABNORMAL LOW (ref 12.0–15.0)
Immature Granulocytes: 1 %
Lymphocytes Relative: 5 %
Lymphs Abs: 0.5 10*3/uL — ABNORMAL LOW (ref 0.7–4.0)
MCH: 28.4 pg (ref 26.0–34.0)
MCHC: 32.4 g/dL (ref 30.0–36.0)
MCV: 87.7 fL (ref 80.0–100.0)
Monocytes Absolute: 0.5 10*3/uL (ref 0.1–1.0)
Monocytes Relative: 6 %
Neutro Abs: 8 10*3/uL — ABNORMAL HIGH (ref 1.7–7.7)
Neutrophils Relative %: 88 %
Platelets: 151 10*3/uL (ref 150–400)
RBC: 2.85 MIL/uL — ABNORMAL LOW (ref 3.87–5.11)
RDW: 13.2 % (ref 11.5–15.5)
WBC: 9 10*3/uL (ref 4.0–10.5)
nRBC: 0 % (ref 0.0–0.2)

## 2019-06-18 LAB — PROTEIN / CREATININE RATIO, URINE
Creatinine, Urine: 178 mg/dL
Protein Creatinine Ratio: 0.49 mg/mg{Cre} — ABNORMAL HIGH (ref 0.00–0.15)
Total Protein, Urine: 88 mg/dL

## 2019-06-18 LAB — COMPREHENSIVE METABOLIC PANEL
ALT: 11 U/L (ref 0–44)
ALT: 13 U/L (ref 0–44)
AST: 22 U/L (ref 15–41)
AST: 24 U/L (ref 15–41)
Albumin: 2.1 g/dL — ABNORMAL LOW (ref 3.5–5.0)
Albumin: 2.4 g/dL — ABNORMAL LOW (ref 3.5–5.0)
Alkaline Phosphatase: 151 U/L — ABNORMAL HIGH (ref 38–126)
Alkaline Phosphatase: 140 U/L — ABNORMAL HIGH (ref 38–126)
Anion gap: 10 (ref 5–15)
Anion gap: 9 (ref 5–15)
BUN: 6 mg/dL (ref 6–20)
BUN: 6 mg/dL (ref 6–20)
CO2: 22 mmol/L (ref 22–32)
CO2: 21 mmol/L — ABNORMAL LOW (ref 22–32)
Calcium: 8.2 mg/dL — ABNORMAL LOW (ref 8.9–10.3)
Calcium: 8 mg/dL — ABNORMAL LOW (ref 8.9–10.3)
Chloride: 105 mmol/L (ref 98–111)
Chloride: 102 mmol/L (ref 98–111)
Creatinine, Ser: 0.63 mg/dL (ref 0.44–1.00)
Creatinine, Ser: 0.62 mg/dL (ref 0.44–1.00)
GFR calc Af Amer: >60 mL/min (ref >60)
GFR calc Af Amer: >60 mL/min (ref >60)
GFR calc non Af Amer: >60 mL/min (ref >60)
GFR calc non Af Amer: >60 mL/min (ref >60)
Glucose, Bld: 99 mg/dL (ref 70–99)
Glucose, Bld: 107 mg/dL — ABNORMAL HIGH (ref 70–99)
Potassium: 4 mmol/L (ref 3.5–5.1)
Potassium: 3.1 mmol/L — ABNORMAL LOW (ref 3.5–5.1)
Sodium: 137 mmol/L (ref 135–145)
Sodium: 132 mmol/L — ABNORMAL LOW (ref 135–145)
Total Bilirubin: 0.3 mg/dL (ref 0.3–1.2)
Total Bilirubin: 0.4 mg/dL (ref 0.3–1.2)
Total Protein: 5.4 g/dL — ABNORMAL LOW (ref 6.5–8.1)
Total Protein: 5.6 g/dL — ABNORMAL LOW (ref 6.5–8.1)

## 2019-06-18 LAB — CBC
HCT: 26.2 % — ABNORMAL LOW (ref 36.0–46.0)
Hemoglobin: 8.3 g/dL — ABNORMAL LOW (ref 12.0–15.0)
MCH: 27.9 pg (ref 26.0–34.0)
MCHC: 31.7 g/dL (ref 30.0–36.0)
MCV: 88.2 fL (ref 80.0–100.0)
Platelets: 182 10*3/uL (ref 150–400)
RBC: 2.97 MIL/uL — ABNORMAL LOW (ref 3.87–5.11)
RDW: 13.2 % (ref 11.5–15.5)
WBC: 9.8 10*3/uL (ref 4.0–10.5)
nRBC: 0 % (ref 0.0–0.2)

## 2019-06-18 LAB — LACTIC ACID, PLASMA
Lactic Acid, Venous: 2.1 mmol/L (ref 0.5–1.9)
Lactic Acid, Venous: 0.8 mmol/L (ref 0.5–1.9)

## 2019-06-18 LAB — URIC ACID: Uric Acid, Serum: 3.1 mg/dL (ref 2.5–7.1)

## 2019-06-18 LAB — SARS CORONAVIRUS 2 BY RT PCR (HOSPITAL ORDER, PERFORMED IN ~~LOC~~ HOSPITAL LAB): SARS Coronavirus 2: NEGATIVE

## 2019-06-18 MED ORDER — BENZOCAINE-MENTHOL 20-0.5 % EX AERO: 1.0000 "application " | INHALATION_SPRAY | CUTANEOUS | 1 refills | Status: DC | PRN

## 2019-06-18 MED ORDER — ACETAMINOPHEN 500 MG PO TABS: 1000.0000 mg | ORAL_TABLET | Freq: Four times a day (QID) | ORAL | Status: DC

## 2019-06-18 MED ORDER — CLINDAMYCIN PHOSPHATE 900 MG/50ML IV SOLN
900.0000 mg | Freq: Three times a day (TID) | INTRAVENOUS | Status: DC
  Filled 2019-06-18 (×3): qty 50

## 2019-06-18 MED ORDER — CLINDAMYCIN PHOSPHATE 900 MG/50ML IV SOLN
900.0000 mg | Freq: Once | INTRAVENOUS | Status: DC
  Administered 2019-06-18: 900 mg via INTRAVENOUS
  Filled 2019-06-18: qty 50

## 2019-06-18 MED ORDER — IBUPROFEN 600 MG PO TABS: 600.0000 mg | ORAL_TABLET | Freq: Four times a day (QID) | ORAL | 0 refills | Status: DC

## 2019-06-18 MED ORDER — CLINDAMYCIN PHOSPHATE 900 MG/50ML IV SOLN
900.0000 mg | Freq: Three times a day (TID) | INTRAVENOUS | Status: DC
  Administered 2019-06-19: 900 mg via INTRAVENOUS
  Filled 2019-06-18 (×3): qty 50

## 2019-06-18 MED ORDER — POTASSIUM CHLORIDE 10 MEQ/100ML IV SOLN
10.0000 meq | INTRAVENOUS | Status: AC
  Administered 2019-06-18 – 2019-06-19 (×4): 10 meq via INTRAVENOUS
  Filled 2019-06-18 (×4): qty 100

## 2019-06-18 MED ORDER — DOCUSATE SODIUM 100 MG PO CAPS: 100.0000 mg | ORAL_CAPSULE | Freq: Two times a day (BID) | ORAL | Status: DC | PRN

## 2019-06-18 MED ORDER — SODIUM CHLORIDE 0.9 % IV BOLUS: 1000.0000 mL | Freq: Once | INTRAVENOUS | Status: DC

## 2019-06-18 MED ORDER — GENTAMICIN SULFATE 40 MG/ML IJ SOLN
5.0000 mg/kg | INTRAVENOUS | Status: DC
  Administered 2019-06-18: 380 mg via INTRAVENOUS
  Filled 2019-06-18 (×2): qty 9.5

## 2019-06-18 MED ORDER — ZOLPIDEM TARTRATE 5 MG PO TABS: 5.0000 mg | ORAL_TABLET | Freq: Every evening | ORAL | Status: DC | PRN

## 2019-06-18 MED ORDER — DOCUSATE SODIUM 100 MG PO CAPS
100.0000 mg | ORAL_CAPSULE | Freq: Every day | ORAL | Status: DC
  Administered 2019-06-18 – 2019-06-21 (×4): 100 mg via ORAL
  Filled 2019-06-18 (×6): qty 1

## 2019-06-18 MED ORDER — SODIUM CHLORIDE 0.9 % IV BOLUS
1000.0000 mL | Freq: Once | INTRAVENOUS | Status: AC
  Administered 2019-06-18: 1000 mL via INTRAVENOUS

## 2019-06-18 MED ORDER — KETOROLAC TROMETHAMINE 30 MG/ML IJ SOLN
30.0000 mg | Freq: Once | INTRAMUSCULAR | Status: AC
  Administered 2019-06-18: 30 mg via INTRAVENOUS
  Filled 2019-06-18: qty 1

## 2019-06-18 MED ORDER — IBUPROFEN 600 MG PO TABS
600.0000 mg | ORAL_TABLET | Freq: Four times a day (QID) | ORAL | Status: DC | PRN
  Administered 2019-06-19 – 2019-06-22 (×5): 600 mg via ORAL
  Filled 2019-06-18: qty 1
  Filled 2019-06-18: qty 2
  Filled 2019-06-18 (×2): qty 1
  Filled 2019-06-18: qty 2

## 2019-06-18 MED ORDER — GENTAMICIN SULFATE 40 MG/ML IJ SOLN
1.5000 mg/kg | Freq: Once | INTRAVENOUS | Status: DC
  Filled 2019-06-18: qty 2.75

## 2019-06-18 MED ORDER — ACETAMINOPHEN 500 MG PO TABS
1000.0000 mg | ORAL_TABLET | Freq: Four times a day (QID) | ORAL | Status: DC
  Administered 2019-06-19 – 2019-06-23 (×16): 1000 mg via ORAL
  Filled 2019-06-18 (×17): qty 2

## 2019-06-18 MED ORDER — SODIUM CHLORIDE 0.9 % IV SOLN
INTRAVENOUS | Status: DC
  Administered 2019-06-18 – 2019-06-19 (×2): via INTRAVENOUS

## 2019-06-18 MED ORDER — PRENATAL MULTIVITAMIN CH
1.0000 | ORAL_TABLET | Freq: Every day | ORAL | Status: DC
  Administered 2019-06-20 – 2019-06-23 (×4): 1 via ORAL
  Filled 2019-06-18 (×5): qty 1

## 2019-06-18 MED ORDER — BENZOCAINE-MENTHOL 20-0.5 % EX AERO
1.0000 "application " | INHALATION_SPRAY | Freq: Three times a day (TID) | CUTANEOUS | Status: DC | PRN
  Filled 2019-06-18: qty 56

## 2019-06-18 MED ORDER — CALCIUM CARBONATE ANTACID 500 MG PO CHEW
2.0000 | CHEWABLE_TABLET | ORAL | Status: DC | PRN
  Administered 2019-06-19: 400 mg via ORAL
  Filled 2019-06-18: qty 2

## 2019-06-18 MED ORDER — NORETHINDRONE 0.35 MG PO TABS: 1.0000 | ORAL_TABLET | Freq: Every day | ORAL | 11 refills | Status: DC

## 2019-06-18 MED ORDER — ACETAMINOPHEN 500 MG PO TABS
1000.0000 mg | ORAL_TABLET | Freq: Once | ORAL | Status: AC
  Administered 2019-06-18: 1000 mg via ORAL
  Filled 2019-06-18: qty 2

## 2019-06-18 NOTE — ED Notes (Signed)
Attempted to call report after covid test resulted, was told they needed to speak with care coordinator before she could come to the floor and they will call me back

## 2019-06-18 NOTE — ED Triage Notes (Signed)
First Nurse Note:  Arrives 2 days post partum with c/o fever.  Patient states fever started about 30 minutes ago, tylenol taken at home PTA.  Referred to ED by Dr. Telford Nab.  Patietn is AAOx3.  Skin warm and dry. NAD

## 2019-06-18 NOTE — Discharge Instructions (Signed)
After Your Delivery Discharge Instructions   Postpartum: Care Instructions  After childbirth (postpartum period), your body goes through many changes. Some of these changes happen over several weeks. In the hours after delivery, your body will begin to recover from childbirth while it prepares to breastfeed your newborn. You may feel emotional during this time. Your hormones can shift your mood without warning for no clear reason.  In the first couple of weeks after childbirth, many women have emotions that change from happy to sad. You may find it hard to sleep. You may cry a lot. This is called the "baby blues." These overwhelming emotions often go away within a couple of days or weeks. But it's important to discuss your feelings with your doctor.  You should call your care provider if you have unrelieved feelings of:  Inability to cope  Sadness  Anxiety  Lack of interest in baby  Insomnia  Crying  It is easy to get too tired and overwhelmed during the first weeks after childbirth. Don't try to do too much. Get rest whenever you can, accept help from others, and eat well and drink plenty of fluids.  About 4 to 6 weeks after your baby's birth, you will have a follow-up visit with your care provider. This visit is your time to talk to your provider about anything you are concerned or curious about.  Follow-up care is a key part of your treatment and safety. Be sure to make and go to all appointments, and call your doctor if you are having problems. It's also a good idea to know your test results and keep a list of the medicines you take.  How can you care for yourself at home?  Sleep or rest when your baby sleeps.  Get help with household chores from family or friends, if you can. Do not try to do it all yourself.  If you have hemorrhoids or swelling or pain around the opening of your vagina, try using cold and heat. You can put ice or a cold pack on the area for 10 to 20 minutes at  a time. Put a thin cloth between the ice and your skin. Also try sitting in a few inches of warm water (sitz bath) 3 times a day and after bowel movements.  Take pain medicines exactly as directed.  If the provider gave you a prescription medicine for pain, take it as prescribed.  If you do not have a prescription and need something over the counter, you can take:  Ibuprofen (Motrin, Advil) up to '600mg'$  every 6 hours as needed for pain  Acetaminophen (Tylenol) up to '650mg'$  every 4 hours as needed for pain  Some people find it helpful to alternate between these two medications.   No driving for 1-2 weeks or while taking pain medications.   Eat more fiber to avoid constipation. Include foods such as whole-grain breads and cereals, raw vegetables, raw and dried fruits, and beans.  Drink plenty of fluids, enough so that your urine is light yellow or clear like water. If you have kidney, heart, or liver disease and have to limit fluids, talk with your doctor before you increase the amount of fluids you drink.  Do not put anything in the vagina for 6 weeks. This means no sex, no tampons, no douching, and no enemas.  If you have stitches, keep the area clean by pouring or spraying warm water over the area outside your vagina and anus after you use the toilet.  No strenuous activity or heavy lifting for 6 weeks   No tub baths; showers only  Continue prenatal vitamin and iron.  If breastfeeding:  Increase calories and fluids while breastfeeding.  You may have a slight fever when your milk comes in, but it should go away on its own. If it does not, and rises above 101.0 please call the doctor.  For breastfeeding concerns, the lactation consultant can be reached at [phone].  For concerns about your baby, please call your pediatrician.   Keep a list of questions to bring to your postpartum visit. Your questions might be about:  Changes in your breasts, such as lumps or  soreness.  When to expect your menstrual period to start again.  What form of birth control is best for you.  Weight you have put on during the pregnancy.  Exercise options.  What foods and drinks are best for you, especially if you are breastfeeding.  Problems you might be having with breastfeeding.  When you can have sex. Some women may want to talk about lubricants for the vagina.  Any feelings of sadness or restlessness that you are having.   When should you call for help?  Call 911 anytime you think you may need emergency care. For example, call if:  You have thoughts of harming yourself, your baby, or another person.  You passed out (lost consciousness).  Call the office at [phone] or seek immediate medical care if:  If you have heavy bleeding such that you are soaking 1 pad in an hour for 2 hours  You are dizzy or lightheaded, or you feel like you may faint.  You have a fever; a temperature of 101.0 F or greater  Chills  Difficulty urinating  Headache unrelieved by "pain meds"   Visual changes  Pain in the right side of your belly near your ribs  Breasts reddened, hard, hot to the touch or any other breast concerns  Nipple discharge which is foul-smelling or contains pus   Increased pain at the site of the tear   New pain unrelieved with recommended over-the-counter dosages  Difficulty breathing with or without chest pain   New leg pain, swelling, or redness, especially if it is only on one leg  Any other concerns  Watch closely for changes in your health, and be sure to contact your provider if:  You have new or worse vaginal discharge.  You feel sad or depressed.  You are having problems with your breasts or breastfeeding.

## 2019-06-18 NOTE — Lactation Note (Signed)
This note was copied from a baby's chart. Lactation Consultation Note  Patient Name: Patricia Pearson [id number] Date: 06/18/2019 Reason for consult: Follow-up assessment   Maternal Data    Feeding Feeding Type: Breast Fed   Interventions Interventions: Skin to skin  Lactation Tools Discussed/Used   Mom was holding baby skin to skin upon entry.  Mom had requested an [place] visit prior to discharge with concerns of baby over-eating.  [facility] intern informed mother about clusterfeeding and infant feeding cues.  Mom was told how breastfeeding works in a supply and demand fashion and that baby should have unlimited access to the breast.  Mom asked about feeding baby formula if baby is incessantly hungry and LC discharged F use unless her pediatrician orders it.  Mom had all questions answered and was referred to her breastfeeding booklet for support information and further breastfeeding education after discharge.  Consult Status Consult Status: Follow-up Date: 06/18/19 Follow-up type: In-patient    Daryel November 06/18/2019, 11:34 AM

## 2019-06-18 NOTE — Progress Notes (Signed)
Pt discharged with infant.  Discharge instructions, prescriptions and follow up appointment given to and reviewed with pt. Pt verbalized understanding. Escorted out by staff. 

## 2019-06-18 NOTE — Progress Notes (Signed)
VS review shows that the blood pressures recorded yesterday seem quite questionable to their validity.    She is currently hypotensive on magnesium sulfate.  Questionable hypertensive values:  159/139 P = 175 182/154 P = 163 197/174 P = 288 216/201 P = 177 211/156 228/113 251/189 233/215 P= 132  All of these were recorded in a 30 minute time frame,   Her blood pressure went from 233/215 to 113/88 in a 7 minute recorded period of time.    I wasn't here to note what was happening but this seems extraordinary. She was given Labetalol IV, and following this she   Due to this, and that she has been predominantly hypotensive, I have decided to stop the magnesium sulfate and blood pressure medications.  Transfer to floor.  ----- Larey Days, MD, [place] Attending Obstetrician and Gynecologist [facility], Department of [facility]

## 2019-06-18 NOTE — ED Provider Notes (Signed)
[facility] Emergency Department Provider Note  ____________________________________________   First MD Initiated Contact with Patient 06/18/19 1802     (approximate)  I have reviewed the triage vital signs and the nursing notes.  History  Chief Complaint Fever    HPI SHEKERA BEAVERS is a 23 y.o. female G1P1 s/p vaginal delivery on 06/16/19 (required AROM and foley bulb induction for pre-eclampsia, GBS negative), discharged today, returns for fever, lower back pain.  Symptoms started just a few hours ago.  She denies any cough or difficulty breathing.  No nausea or vomiting.  She has been breast-feeding with no skin irritation or breast pain.  She denies any abdominal pain, dysuria, or changes to her expected postpartum discharge.         Past Medical Hx Past Medical History:  Diagnosis Date  . Anemia   . Anxiety   . Kidney stone     Problem List Patient Active Problem List   Diagnosis Date Noted  . Indication for care or intervention related to labor and delivery 06/15/2019  . Dizziness 06/15/2019  . Severe preeclampsia 06/15/2019  . Uterine contractions during pregnancy 06/07/2019  . Flank pain in pregnant patient 04/12/2019  . Pelvic pressure in pregnancy, antepartum, third trimester 04/11/2019  . Anemia 03/04/2017  . Generalized anxiety disorder with panic attacks 03/04/2017  . Chronic tension-type headache, not intractable 09/06/2016  . Chronic right flank pain 12/07/2014  . History of kidney stones 12/07/2014  . History of hematuria 12/07/2014  . Menorrhagia 04/28/2014    Past Surgical Hx Past Surgical History:  Procedure Laterality Date  . STENT PLACEMENT RT URETER ([place] HX) Right 04/09/2013  . WISDOM TOOTH EXTRACTION  2016    Medications Prior to Admission medications   Medication Sig Start Date End Date Taking? Authorizing Provider  benzocaine-Menthol (DERMOPLAST) 20-0.5 % AERO Apply 1 application topically as needed for  irritation (perineal discomfort). 06/18/19   Schermerhorn, Gwen Her, MD  ibuprofen (ADVIL) 600 MG tablet Take 1 tablet (600 mg total) by mouth every 6 (six) hours. 06/18/19   Schermerhorn, Gwen Her, MD  norethindrone (ORTHO MICRONOR) 0.35 MG tablet Take 1 tablet (0.35 mg total) by mouth daily. 06/18/19 06/17/20  Schermerhorn, Gwen Her, MD  Prenatal Vit-Fe Fumarate-FA (MULTIVITAMIN-PRENATAL) 27-0.8 MG TABS tablet Take 1 tablet by mouth daily at 12 noon.    [provider]    Allergies Patient has no known allergies.  Family Hx Family History  Problem Relation Age of Onset  . Basal cell carcinoma Mother   . Anxiety disorder Mother   . Drug abuse Father   . Peptic Ulcer Father   . Heart failure Maternal Grandfather   . Lung cancer Paternal Grandmother   . Colon cancer Neg Hx   . Cervical cancer Neg Hx   . Breast cancer Neg Hx     Social Hx Social History   Tobacco Use  . Smoking status: Never Smoker  . Smokeless tobacco: Never Used  Substance Use Topics  . Alcohol use: No  . Drug use: No     Review of Systems  Constitutional: + for fever. Negative for chills. Eyes: Negative for visual changes. ENT: Negative for sore throat. Cardiovascular: Negative for chest pain. Respiratory: Negative for shortness of breath. Gastrointestinal: Negative for abdominal pain. Negative for nausea. Negative for vomiting. Genitourinary: Negative for dysuria. Musculoskeletal: + low back pain Skin: Negative for rash. Neurological: Negative for for headaches.   Physical Exam  Vital Signs: ED Triage Vitals  Enc Vitals Group     BP 06/18/19 1749 116/74     Pulse Rate 06/18/19 1749 (!) 162     Resp 06/18/19 1749 (!) 24     Temp 06/18/19 1749 (!) 103.2 F (39.6 C)     Temp Source 06/18/19 1749 Oral     SpO2 06/18/19 1749 98 %     Weight --      Height --      Head Circumference --      Peak Flow --      Pain Score 06/18/19 1750 3     Pain Loc --      Pain Edu? --      Excl. in  [place]? --     Constitutional: Alert and oriented.  Eyes: Conjunctivae clear. Sclera anicteric. Head: Normocephalic. Atraumatic. Nose: No congestion. No rhinorrhea. Mouth/Throat: Mucous membranes are moist.  Neck: No stridor.   Cardiovascular: Tachycardic, regular rhythm. No murmurs. Extremities well perfused. Respiratory: Normal respiratory effort.  Lungs CTAB. Gastrointestinal: Soft, very minimal discomfort with suprapubic palpation, no rebound or guarding, remainder of abdomen is soft and nontender. No distention.  Musculoskeletal: No lower extremity edema. Neurologic:  Normal speech and language. No gross focal neurologic deficits are appreciated.  Skin: Skin is warm, dry and intact.  Epidural site is well-appearing. Psychiatric: Mood and affect are appropriate for situation.  EKG  Personally reviewed.   Rate: tachycardia, 136 Rhythm: sinus Axis: normal Intervals: within normal limits T wave inversion III No acute ischemic changes   Radiology  Portable XR: IMPRESSION: Scattered subsegmental atelectasis.   Procedures  Procedure(s) performed (including critical care):  Procedures   Initial Impression / Assessment and Plan / ED Course  23 y.o. female G1P1 s/p vaginal delivery on 8/11 (required AROM and foley bulb induction for pre-eclampsia, GBS negative), discharged today, returns for fever, lower back pain.   On exam she is febrile to 103.2 with associated tachycardia.  She has very minimal lower abdominal expected tenderness, no rebound or guarding.  Epidural site is well-appearing.  Differential includes UTI, pyelonephritis, postpartum endometritis, COVID.  Plan for labs, fluids, empiric antibiotics, and likely admission.  Normal WBC count, lactic acid mildly elevated at 2.1.  Urinalysis concerning for infection.  Discussed with OB/GYN, will cover with clindamycin, gentamicin and admit.  Final Clinical Impression(s) / ED Diagnosis  Final diagnoses:  Fever in  adult  Other urinary tract infection following delivery     Note:  This document was prepared using Dragon voice recognition software and may include unintentional dictation errors.   Lilia Pro., MD 06/18/19 2025

## 2019-06-18 NOTE — ED Triage Notes (Addendum)
Pt to triage via wheelchair.  Pt had vaginal delivery 2 days ago at [facility].  Fever began today.  Pt took 1 500mg  tylenol.  Pt has lower back pain and has a headache.   No sob.  No cough.  No chest pain.  Pt alert.  Hx preeclampsia. Pt tearful.

## 2019-06-18 NOTE — H&P (Signed)
Patricia Pearson is an 23 y.o. female. Present to the ED  Several hours after d/c today with chills and rigors and elevated temp 103 and tachycardia . Pt was initially admitted on 06/15/2019 with atypical preeclampsia and underwent an induction and SVD on 06/16/2019. Cle with 2 attempts . Magnesium IV for sz prophylaxis and this was stopped less than 24 hours after PP due to concern it was causing Hypotension . Today on exam she was asymptomatic with a normal exam . Good urine output and normal labs except anemia c/w delivery . She is breast feeding and she does not feel engorged and no breast redness. No abdominal pain and no perineal pain . She does c/o of a mild h/a with her neck being sore . No photophobia .She denies cough , no sob  or rhinorrhea. Pertinent Gynecological History:   OB History: G1, P1   Menstrual History:  Patient's last menstrual period was 09/22/2018.    Past Medical History:  Diagnosis Date  . Anemia   . Anxiety   . Kidney stone     Past Surgical History:  Procedure Laterality Date  . STENT PLACEMENT RT URETER ([place] HX) Right 04/09/2013  . WISDOM TOOTH EXTRACTION  2016    Family History  Problem Relation Age of Onset  . Basal cell carcinoma Mother   . Anxiety disorder Mother   . Drug abuse Father   . Peptic Ulcer Father   . Heart failure Maternal Grandfather   . Lung cancer Paternal Grandmother   . Colon cancer Neg Hx   . Cervical cancer Neg Hx   . Breast cancer Neg Hx     Social History:  reports that she has never smoked. She has never used smokeless tobacco. She reports that she does not drink alcohol or use drugs.  Allergies: No Known Allergies  (Not in a hospital admission)   ROS Review of Systems: A full review of systems was performed and negative except as noted in the HPI.   General + fever , + rigors  Eyes: no vision change  Ears: left ear pain  Oropharynx: no sore throat  Pulmonary . No shortness of breath , no  hemoptysis Cardiovascular: no chest pain , no irregular heart beat  Gastrointestinal:no blood in stool . No diarrhea, no constipation Uro gynecologic: no dysuria , no pelvic pain Neurologic : no seizure , no migraines    Musculoskeletal: no muscular weakness  Blood pressure 124/62, pulse (!) 133, temperature (!) 103.2 F (39.6 C), temperature source Oral, resp. rate (!) 22, last menstrual period 09/22/2018, SpO2 99 %, unknown if currently breastfeeding. Physical Exam  WDWN non toxic appearing female Neck : no nuchal rigidity  Lungs CTA   Abd soft NT  UTX : non tender  Back : no CVAT    Results for orders placed or performed during the hospital encounter of 06/18/19 (from the past 24 hour(s))  Comprehensive metabolic panel     Status: Abnormal   Collection Time: 06/18/19  6:34 PM  Result Value Ref Range   Sodium 132 (L) 135 - 145 mmol/L   Potassium 3.1 (L) 3.5 - 5.1 mmol/L   Chloride 102 98 - 111 mmol/L   CO2 21 (L) 22 - 32 mmol/L   Glucose, Bld 107 (H) 70 - 99 mg/dL   BUN 6 6 - 20 mg/dL   Creatinine, Ser 0.62 0.44 - 1.00 mg/dL   Calcium 8.0 (L) 8.9 - 10.3 mg/dL   Total Protein 5.6 (L)  6.5 - 8.1 g/dL   Albumin 2.4 (L) 3.5 - 5.0 g/dL   AST 24 15 - 41 U/L   ALT 13 0 - 44 U/L   Alkaline Phosphatase 140 (H) 38 - 126 U/L   Total Bilirubin 0.4 0.3 - 1.2 mg/dL   GFR calc non Af Amer >60 >60 mL/min   GFR calc Af Amer >60 >60 mL/min   Anion gap 9 5 - 15  Lactic acid, plasma     Status: Abnormal   Collection Time: 06/18/19  6:34 PM  Result Value Ref Range   Lactic Acid, Venous 2.1 (HH) 0.5 - 1.9 mmol/L  CBC with Differential     Status: Abnormal   Collection Time: 06/18/19  6:34 PM  Result Value Ref Range   WBC 9.0 4.0 - 10.5 K/uL   RBC 2.85 (L) 3.87 - 5.11 MIL/uL   Hemoglobin 8.1 (L) 12.0 - 15.0 g/dL   HCT 25.0 (L) 36.0 - 46.0 %   MCV 87.7 80.0 - 100.0 fL   MCH 28.4 26.0 - 34.0 pg   MCHC 32.4 30.0 - 36.0 g/dL   RDW 13.2 11.5 - 15.5 %   Platelets 151 150 - 400 K/uL   nRBC  0.0 0.0 - 0.2 %   Neutrophils Relative % 88 %   Neutro Abs 8.0 (H) 1.7 - 7.7 K/uL   Lymphocytes Relative 5 %   Lymphs Abs 0.5 (L) 0.7 - 4.0 K/uL   Monocytes Relative 6 %   Monocytes Absolute 0.5 0.1 - 1.0 K/uL   Eosinophils Relative 0 %   Eosinophils Absolute 0.0 0.0 - 0.5 K/uL   Basophils Relative 0 %   Basophils Absolute 0.0 0.0 - 0.1 K/uL   Immature Granulocytes 1 %   Abs Immature Granulocytes 0.09 (H) 0.00 - 0.07 K/uL    Dg Chest Port 1 View  Result Date: 06/18/2019 CLINICAL DATA:  Fever today. Recent postpartum post vaginal delivery. EXAM: PORTABLE CHEST 1 VIEW COMPARISON:  None. FINDINGS: The cardiomediastinal contours are normal. Scattered subsegmental atelectasis. No confluent airspace disease. Pulmonary vasculature is normal. No large pleural effusion or pneumothorax. No acute osseous abnormalities are seen. IMPRESSION: Scattered subsegmental atelectasis. Electronically Signed   By: Keith Rake M.D.   On: 06/18/2019 18:59    Assessment/Plan: 1.Elevated temp , tachycardia and elevated lactic acid  All c/w sepsis. However has a normal exam  Differential includes viral infection / covid ( although recently tested neg ) Breast engorgement(would not be a cause of elevated lactic acid)  early endometritis, urolithiasis, meningitis( pt non toxic appearing ). 2. hypokalemia  Will treat for sepsis with Gent and Clindamycin . IVF bolus 1 liter then and run at 125 cc/ hr   replace K+ Tylenol 1000 mg q6 hrs  Repeat labs in am  covid test pending in ED   Patricia Pearson 06/18/2019, 7:27 PM

## 2019-06-18 NOTE — ED Notes (Signed)
ED TO INPATIENT HANDOFF REPORT  ED Nurse Name and Phone #: Janett Billow 10   S Name/Age/Gender Patricia Pearson 23 y.o. female Room/Bed: ED24A/ED24A  Code Status   Code Status: Full Code  Home/SNF/Other Home Patient oriented to: self, place, time and situation Is this baseline? Yes   Triage Complete: Triage complete  Chief Complaint Postpartum fever  Triage Note First Nurse Note:  Arrives 2 days post partum with c/o fever.  Patient states fever started about 30 minutes ago, tylenol taken at home PTA.  Referred to ED by Dr. Telford Nab.  Patietn is AAOx3.  Skin warm and dry. NAD  Pt to triage via wheelchair.  Pt had vaginal delivery 2 days ago at [facility].  Fever began today.  Pt took 1 500mg  tylenol.  Pt has lower back pain and has a headache.   No sob.  No cough.  No chest pain.  Pt alert.  Hx preeclampsia. Pt tearful.     Allergies No Known Allergies  Level of Care/Admitting Diagnosis ED Disposition    ED Disposition Condition [facility]: [place] [[id number]]  Level of Care: Postpartum [19]  Covid Evaluation: Asymptomatic Screening Protocol (No Symptoms)  Diagnosis: Fever of unknown origin following delivery, postpartum [[id number]]  Admitting Physician: Boykin Nearing [[id number]]  Attending Physician: Boykin Nearing [[id number]]  Estimated length of stay: past midnight tomorrow  Certification:: I certify this patient will need inpatient services for at least 2 midnights  PT Class (Do Not Modify): Inpatient [101]  PT Acc Code (Do Not Modify): Private [1]       B Medical/Surgery History Past Medical History:  Diagnosis Date  . Anemia   . Anxiety   . Kidney stone    Past Surgical History:  Procedure Laterality Date  . STENT PLACEMENT RT URETER ([place] HX) Right 04/09/2013  . WISDOM TOOTH EXTRACTION  2016     A IV Location/Drains/Wounds Patient Lines/Drains/Airways Status   Active Line/Drains/Airways    Name:   Placement date:   Placement time:   Site:   Days:   Peripheral IV 06/15/19 Right;Lateral Forearm   06/15/19    1604    Forearm   3   Peripheral IV 06/18/19 Right Antecubital   06/18/19    1832    Antecubital   less than 1   Peripheral IV 06/18/19 Left Wrist   06/18/19    1837    Wrist   less than 1          Intake/Output Last 24 hours No intake or output data in the 24 hours ending 06/18/19 1934  Labs/Imaging Results for orders placed or performed during the hospital encounter of 06/18/19 (from the past 48 hour(s))  Comprehensive metabolic panel     Status: Abnormal   Collection Time: 06/18/19  6:34 PM  Result Value Ref Range   Sodium 132 (L) 135 - 145 mmol/L   Potassium 3.1 (L) 3.5 - 5.1 mmol/L   Chloride 102 98 - 111 mmol/L   CO2 21 (L) 22 - 32 mmol/L   Glucose, Bld 107 (H) 70 - 99 mg/dL   BUN 6 6 - 20 mg/dL   Creatinine, Ser 0.62 0.44 - 1.00 mg/dL   Calcium 8.0 (L) 8.9 - 10.3 mg/dL   Total Protein 5.6 (L) 6.5 - 8.1 g/dL   Albumin 2.4 (L) 3.5 - 5.0 g/dL   AST 24 15 - 41 U/L   ALT 13 0 - 44 U/L  Alkaline Phosphatase 140 (H) 38 - 126 U/L   Total Bilirubin 0.4 0.3 - 1.2 mg/dL   GFR calc non Af Amer >60 >60 mL/min   GFR calc Af Amer >60 >60 mL/min   Anion gap 9 5 - 15    Comment: Performed at [facility], [address] [place], [place] [address]  Lactic acid, plasma     Status: Abnormal   Collection Time: 06/18/19  6:34 PM  Result Value Ref Range   Lactic Acid, Venous 2.1 (HH) 0.5 - 1.9 mmol/L    Comment: CRITICAL RESULT CALLED TO, READ BACK BY AND VERIFIED WITH Danea Manter 06/18/19 @ [place] Performed at [facility], [address] [place], [place] [address]   CBC with Differential     Status: Abnormal   Collection Time: 06/18/19  6:34 PM  Result Value Ref Range   WBC 9.0 4.0 - 10.5 K/uL   RBC 2.85 (L) 3.87 - 5.11 MIL/uL   Hemoglobin 8.1 (L) 12.0 - 15.0 g/dL   HCT 25.0 (L) 36.0 - 46.0 %   MCV 87.7 80.0 - 100.0 fL   MCH 28.4 26.0  - 34.0 pg   MCHC 32.4 30.0 - 36.0 g/dL   RDW 13.2 11.5 - 15.5 %   Platelets 151 150 - 400 K/uL   nRBC 0.0 0.0 - 0.2 %   Neutrophils Relative % 88 %   Neutro Abs 8.0 (H) 1.7 - 7.7 K/uL   Lymphocytes Relative 5 %   Lymphs Abs 0.5 (L) 0.7 - 4.0 K/uL   Monocytes Relative 6 %   Monocytes Absolute 0.5 0.1 - 1.0 K/uL   Eosinophils Relative 0 %   Eosinophils Absolute 0.0 0.0 - 0.5 K/uL   Basophils Relative 0 %   Basophils Absolute 0.0 0.0 - 0.1 K/uL   Immature Granulocytes 1 %   Abs Immature Granulocytes 0.09 (H) 0.00 - 0.07 K/uL    Comment: Performed at [facility], [address] [place], [place] [address]   Dg Chest Port 1 View  Result Date: 06/18/2019 CLINICAL DATA:  Fever today. Recent postpartum post vaginal delivery. EXAM: PORTABLE CHEST 1 VIEW COMPARISON:  None. FINDINGS: The cardiomediastinal contours are normal. Scattered subsegmental atelectasis. No confluent airspace disease. Pulmonary vasculature is normal. No large pleural effusion or pneumothorax. No acute osseous abnormalities are seen. IMPRESSION: Scattered subsegmental atelectasis. Electronically Signed   By: Keith Rake M.D.   On: 06/18/2019 18:59    Pending Labs Unresulted Labs (From admission, onward)    Start     Ordered   06/19/19 0500  CBC on admission  Tomorrow morning,   STAT     06/18/19 1915   06/19/19 0500  Comprehensive metabolic panel  Tomorrow morning,   STAT     06/18/19 1915   06/18/19 1918  SARS Coronavirus 2 [facility] order, Performed in [place] hospital lab) Nasopharyngeal Nasopharyngeal Swab  (Symptomatic/High Risk of Exposure/Tier 1 Patients Labs with Precautions)  ONCE - STAT,   STAT    Question Answer Comment  Is this test for diagnosis or screening Diagnosis of ill patient   Symptomatic for COVID-19 as defined by CDC Yes   Date of Symptom Onset 06/18/2019   Hospitalized for COVID-19 No   Admitted to ICU for COVID-19 No   Previously tested for COVID-19 Yes   Resident in a  congregate (group) care setting No   Employed in healthcare setting No   Pregnant No  06/18/19 1918   06/18/19 1913  Uric acid  Once,   STAT     06/18/19 1915   06/18/19 1913  Protein / creatinine ratio, urine  ONCE - STAT,   STAT     06/18/19 1915   06/18/19 1910  Type and screen [place]  ONCE - STAT,   STAT    Comments: [place]    06/18/19 1915   06/18/19 1823  SARS CORONAVIRUS 2 Nasal Swab Aptima Multi Swab  (Asymptomatic/Tier 2 Patients Labs)  ONCE - STAT,   STAT    Question Answer Comment  Is this test for diagnosis or screening Screening   Symptomatic for COVID-19 as defined by CDC No   Hospitalized for COVID-19 No   Admitted to ICU for COVID-19 No   Previously tested for COVID-19 Yes   Resident in a congregate (group) care setting No   Employed in healthcare setting No   Pregnant No      06/18/19 1822   06/18/19 1822  Lactic acid, plasma  Now then every 2 hours,   STAT     06/18/19 1822   06/18/19 1822  Urinalysis, Complete w Microscopic  ONCE - STAT,   STAT     06/18/19 1822   06/18/19 1822  Culture, blood (routine x 2)  BLOOD CULTURE X 2,   STAT     06/18/19 1822          Vitals/Pain Today's Vitals   06/18/19 1845 06/18/19 1900 06/18/19 1915 06/18/19 1930  BP:  127/65  (!) 103/58  Pulse: (!) 144 (!) 133 (!) 130 (!) 133  Resp: 18 (!) 22 20 19   Temp:      TempSrc:      SpO2: 99% 99% 100% 100%  PainSc:        Isolation Precautions No active isolations  Medications Medications  acetaminophen (TYLENOL) tablet 1,000 mg (has no administration in time range)  zolpidem (AMBIEN) tablet 5 mg (has no administration in time range)  docusate sodium (COLACE) capsule 100 mg (has no administration in time range)  calcium carbonate (TUMS - dosed in mg elemental calcium) chewable tablet 400 mg of elemental calcium (has no administration in time range)  prenatal multivitamin tablet 1 tablet (has no administration in  time range)  sodium chloride 0.9 % bolus 1,000 mL (1,000 mLs Intravenous New Bag/Given 06/18/19 1858)  acetaminophen (TYLENOL) tablet 1,000 mg (1,000 mg Oral Given 06/18/19 1902)    Mobility walks Low fall risk   Focused Assessments 1   R Recommendations: See Admitting Provider Note  Report given to:   Additional Notes:

## 2019-06-18 NOTE — ED Notes (Addendum)
Pt sitting in bed speaking with this RN in NAD. PT reports increased HR, elevated temp, headache and "hands going numb". Pt reports she was just discharged today after vaginal delivery. Hx preclampsia. Pt had labwork done this morning before discharge

## 2019-06-18 NOTE — Progress Notes (Signed)
Post Partum Day 1 Subjective: On mag sulfate for significantly elevated pressures immediately postpartum. Foley in place, has not ambulated, bonding with baby, breastfeeding, tolerating regular diet, tolerating pain.  No CP SOB F/C N/V or leg pain no HA change of vision, RUQ/epigastric pain  Objective: BP 127/86 (BP Location: Left Arm)   Pulse (!) 113   Temp 98.2 F (36.8 C) (Oral)   Resp 16   Ht 5\' 3"  (1.6 m)   Wt 75.3 kg   LMP 09/22/2018   SpO2 98%   Breastfeeding Unknown   BMI 29.41 kg/m    Physical Exam:  General: NAD CV: RRR Pulm: nl effort, CTABL Lochia: moderate Uterine Fundus: fundus firm and below umbilicus DVT Evaluation: no cords, ttp LEs 2+ reflexes   Recent Labs    06/15/19 1515 06/17/19 0723  HGB 9.6* 8.6*  HCT 29.2* 26.7*  WBC 8.4 9.9  PLT 200 163    Assessment/Plan: 23 y.o. G1P1001 postpartum day # 1  1. Anemia - prior to delivery, asymptomatic, will recheck tomorrow AM, continue iron supplementation 2. Continue to watch BPs, although currently normotensive. 3. No s/sx magnesium toxicity 4. Lactation support    ----- Larey Days, MD Attending Obstetrician and Gynecologist [facility]

## 2019-06-18 NOTE — ED Notes (Signed)
Attempted to call report to mother baby, advised that covid results must be back before she can be brought to the floor due to fever

## 2019-06-18 NOTE — ED Notes (Signed)
Spoke with Alinda Sierras on [facility] who states they need second lactic acid level before the patient can come to the floor

## 2019-06-18 NOTE — ED Notes (Signed)
Report given to Nora, RN

## 2019-06-19 ENCOUNTER — Inpatient Hospital Stay: Payer: 59

## 2019-06-19 DIAGNOSIS — A419 Sepsis, unspecified organism: Secondary | ICD-10-CM

## 2019-06-19 DIAGNOSIS — R000 Tachycardia, unspecified: Secondary | ICD-10-CM

## 2019-06-19 DIAGNOSIS — R6520 Severe sepsis without septic shock: Secondary | ICD-10-CM

## 2019-06-19 DIAGNOSIS — N12 Tubulo-interstitial nephritis, not specified as acute or chronic: Secondary | ICD-10-CM

## 2019-06-19 LAB — CBC
HCT: 22.3 % — ABNORMAL LOW (ref 36.0–46.0)
HCT: 26.1 % — ABNORMAL LOW (ref 36.0–46.0)
HCT: 25.7 % — ABNORMAL LOW (ref 36.0–46.0)
Hemoglobin: 7.1 g/dL — ABNORMAL LOW (ref 12.0–15.0)
Hemoglobin: 8.6 g/dL — ABNORMAL LOW (ref 12.0–15.0)
Hemoglobin: 8.4 g/dL — ABNORMAL LOW (ref 12.0–15.0)
MCH: 28.1 pg (ref 26.0–34.0)
MCH: 28.5 pg (ref 26.0–34.0)
MCH: 28.5 pg (ref 26.0–34.0)
MCHC: 31.8 g/dL (ref 30.0–36.0)
MCHC: 33 g/dL (ref 30.0–36.0)
MCHC: 32.7 g/dL (ref 30.0–36.0)
MCV: 88.1 fL (ref 80.0–100.0)
MCV: 86.4 fL (ref 80.0–100.0)
MCV: 87.1 fL (ref 80.0–100.0)
Platelets: 142 10*3/uL — ABNORMAL LOW (ref 150–400)
Platelets: 139 10*3/uL — ABNORMAL LOW (ref 150–400)
Platelets: 133 10*3/uL — ABNORMAL LOW (ref 150–400)
RBC: 2.53 MIL/uL — ABNORMAL LOW (ref 3.87–5.11)
RBC: 3.02 MIL/uL — ABNORMAL LOW (ref 3.87–5.11)
RBC: 2.95 MIL/uL — ABNORMAL LOW (ref 3.87–5.11)
RDW: 13.3 % (ref 11.5–15.5)
RDW: 13.7 % (ref 11.5–15.5)
RDW: 13.9 % (ref 11.5–15.5)
WBC: 7.3 10*3/uL (ref 4.0–10.5)
WBC: 7.9 10*3/uL (ref 4.0–10.5)
WBC: 11.2 10*3/uL — ABNORMAL HIGH (ref 4.0–10.5)
nRBC: 0 % (ref 0.0–0.2)
nRBC: 0 % (ref 0.0–0.2)
nRBC: 0 % (ref 0.0–0.2)

## 2019-06-19 LAB — PROTIME-INR
INR: 1.2 (ref 0.8–1.2)
Prothrombin Time: 15.3 seconds — ABNORMAL HIGH (ref 11.4–15.2)

## 2019-06-19 LAB — COMPREHENSIVE METABOLIC PANEL
ALT: 15 U/L (ref 0–44)
ALT: 25 U/L (ref 0–44)
AST: 28 U/L (ref 15–41)
AST: 48 U/L — ABNORMAL HIGH (ref 15–41)
Albumin: 2 g/dL — ABNORMAL LOW (ref 3.5–5.0)
Albumin: 2.2 g/dL — ABNORMAL LOW (ref 3.5–5.0)
Alkaline Phosphatase: 122 U/L (ref 38–126)
Alkaline Phosphatase: 112 U/L (ref 38–126)
Anion gap: 6 (ref 5–15)
Anion gap: 8 (ref 5–15)
BUN: 7 mg/dL (ref 6–20)
BUN: 7 mg/dL (ref 6–20)
CO2: 20 mmol/L — ABNORMAL LOW (ref 22–32)
CO2: 20 mmol/L — ABNORMAL LOW (ref 22–32)
Calcium: 7.8 mg/dL — ABNORMAL LOW (ref 8.9–10.3)
Calcium: 7.9 mg/dL — ABNORMAL LOW (ref 8.9–10.3)
Chloride: 109 mmol/L (ref 98–111)
Chloride: 109 mmol/L (ref 98–111)
Creatinine, Ser: 0.49 mg/dL (ref 0.44–1.00)
Creatinine, Ser: 0.5 mg/dL (ref 0.44–1.00)
GFR calc Af Amer: >60 mL/min (ref >60)
GFR calc Af Amer: >60 mL/min (ref >60)
GFR calc non Af Amer: >60 mL/min (ref >60)
GFR calc non Af Amer: >60 mL/min (ref >60)
Glucose, Bld: 115 mg/dL — ABNORMAL HIGH (ref 70–99)
Glucose, Bld: 97 mg/dL (ref 70–99)
Potassium: 3.7 mmol/L (ref 3.5–5.1)
Potassium: 3.2 mmol/L — ABNORMAL LOW (ref 3.5–5.1)
Sodium: 135 mmol/L (ref 135–145)
Sodium: 137 mmol/L (ref 135–145)
Total Bilirubin: 0.5 mg/dL (ref 0.3–1.2)
Total Bilirubin: 0.4 mg/dL (ref 0.3–1.2)
Total Protein: 5 g/dL — ABNORMAL LOW (ref 6.5–8.1)
Total Protein: 5.2 g/dL — ABNORMAL LOW (ref 6.5–8.1)

## 2019-06-19 LAB — GLUCOSE, CAPILLARY
Glucose-Capillary: 58 mg/dL — ABNORMAL LOW (ref 70–99)
Glucose-Capillary: 76 mg/dL (ref 70–99)
Glucose-Capillary: 106 mg/dL — ABNORMAL HIGH (ref 70–99)

## 2019-06-19 LAB — BASIC METABOLIC PANEL
Anion gap: 7 (ref 5–15)
BUN: 8 mg/dL (ref 6–20)
CO2: 21 mmol/L — ABNORMAL LOW (ref 22–32)
Calcium: 7.9 mg/dL — ABNORMAL LOW (ref 8.9–10.3)
Chloride: 108 mmol/L (ref 98–111)
Creatinine, Ser: 0.51 mg/dL (ref 0.44–1.00)
GFR calc Af Amer: >60 mL/min (ref >60)
GFR calc non Af Amer: >60 mL/min (ref >60)
Glucose, Bld: 114 mg/dL — ABNORMAL HIGH (ref 70–99)
Potassium: 3.3 mmol/L — ABNORMAL LOW (ref 3.5–5.1)
Sodium: 136 mmol/L (ref 135–145)

## 2019-06-19 LAB — HCG, QUANTITATIVE, PREGNANCY: hCG, Beta Chain, Quant, S: 1263 m[IU]/mL — ABNORMAL HIGH (ref <5)

## 2019-06-19 LAB — TROPONIN I (HIGH SENSITIVITY): Troponin I (High Sensitivity): 4 ng/L (ref <18)

## 2019-06-19 LAB — PHOSPHORUS: Phosphorus: 3.6 mg/dL (ref 2.5–4.6)

## 2019-06-19 LAB — PROCALCITONIN: Procalcitonin: 4.18 ng/mL

## 2019-06-19 LAB — FIBRINOGEN: Fibrinogen: 630 mg/dL — ABNORMAL HIGH (ref 210–475)

## 2019-06-19 LAB — PREPARE RBC (CROSSMATCH)

## 2019-06-19 LAB — MAGNESIUM: Magnesium: 1.4 mg/dL — ABNORMAL LOW (ref 1.7–2.4)

## 2019-06-19 LAB — LACTIC ACID, PLASMA: Lactic Acid, Venous: 1.3 mmol/L (ref 0.5–1.9)

## 2019-06-19 MED ORDER — SODIUM CHLORIDE 0.9 % IV SOLN
2.0000 g | Freq: Three times a day (TID) | INTRAVENOUS | Status: DC
  Filled 2019-06-19 (×3): qty 2

## 2019-06-19 MED ORDER — ONDANSETRON HCL 4 MG/2ML IJ SOLN
INTRAMUSCULAR | Status: AC
  Filled 2019-06-19: qty 2

## 2019-06-19 MED ORDER — MAGNESIUM SULFATE 2 GM/50ML IV SOLN
2.0000 g | Freq: Once | INTRAVENOUS | Status: AC
  Administered 2019-06-20: 2 g via INTRAVENOUS
  Filled 2019-06-19: qty 50

## 2019-06-19 MED ORDER — SODIUM CHLORIDE 0.9 % IV SOLN
1.0000 g | INTRAVENOUS | Status: DC
  Administered 2019-06-19: 1 g via INTRAVENOUS
  Filled 2019-06-19: qty 10
  Filled 2019-06-19: qty 1

## 2019-06-19 MED ORDER — SODIUM CHLORIDE 0.9 % IV SOLN
1.0000 g | Freq: Three times a day (TID) | INTRAVENOUS | Status: DC
  Administered 2019-06-19 – 2019-06-21 (×6): 1 g via INTRAVENOUS
  Filled 2019-06-19 (×9): qty 1

## 2019-06-19 MED ORDER — FAMOTIDINE IN NACL 20-0.9 MG/50ML-% IV SOLN: 20.0000 mg | Freq: Two times a day (BID) | INTRAVENOUS | Status: DC

## 2019-06-19 MED ORDER — FERROUS SULFATE 325 (65 FE) MG PO TABS
325.0000 mg | ORAL_TABLET | Freq: Two times a day (BID) | ORAL | Status: DC
  Administered 2019-06-20 – 2019-06-23 (×7): 325 mg via ORAL
  Filled 2019-06-19 (×7): qty 1

## 2019-06-19 MED ORDER — ONDANSETRON HCL 4 MG/2ML IJ SOLN
4.0000 mg | Freq: Four times a day (QID) | INTRAMUSCULAR | Status: DC | PRN
  Administered 2019-06-19 – 2019-06-23 (×3): 4 mg via INTRAVENOUS
  Filled 2019-06-19 (×2): qty 2

## 2019-06-19 MED ORDER — DEXTROSE 50 % IV SOLN
INTRAVENOUS | Status: AC
  Filled 2019-06-19: qty 50

## 2019-06-19 MED ORDER — POTASSIUM CHLORIDE CRYS ER 20 MEQ PO TBCR
40.0000 meq | EXTENDED_RELEASE_TABLET | Freq: Once | ORAL | Status: AC
  Administered 2019-06-20: 40 meq via ORAL
  Filled 2019-06-19: qty 2

## 2019-06-19 MED ORDER — DEXTROSE 50 % IV SOLN
1.0000 | Freq: Once | INTRAVENOUS | Status: AC
  Administered 2019-06-19: 50 mL via INTRAVENOUS

## 2019-06-19 MED ORDER — HYDROMORPHONE HCL 1 MG/ML IJ SOLN
0.5000 mg | Freq: Once | INTRAMUSCULAR | Status: AC
  Administered 2019-06-19: 0.5 mg via INTRAVENOUS
  Filled 2019-06-19: qty 1

## 2019-06-19 MED ORDER — FAMOTIDINE IN NACL 20-0.9 MG/50ML-% IV SOLN
20.0000 mg | Freq: Two times a day (BID) | INTRAVENOUS | Status: DC
  Administered 2019-06-19: 20 mg via INTRAVENOUS
  Filled 2019-06-19: qty 50

## 2019-06-19 MED ORDER — SODIUM CHLORIDE 0.9% IV SOLUTION
Freq: Once | INTRAVENOUS | Status: AC
  Administered 2019-06-19: 11:00:00 via INTRAVENOUS

## 2019-06-19 MED ORDER — IOHEXOL 350 MG/ML SOLN
75.0000 mL | Freq: Once | INTRAVENOUS | Status: AC | PRN
  Administered 2019-06-19: 75 mL via INTRAVENOUS

## 2019-06-19 MED ORDER — METHYLERGONOVINE MALEATE 0.2 MG PO TABS: 0.2000 mg | ORAL_TABLET | Freq: Once | ORAL | Status: AC

## 2019-06-19 MED ORDER — DEXTROSE IN LACTATED RINGERS 5 % IV SOLN
INTRAVENOUS | Status: DC
  Administered 2019-06-19: 14:00:00 via INTRAVENOUS

## 2019-06-19 MED ORDER — DEXTROSE-NACL 5-0.9 % IV SOLN: INTRAVENOUS | Status: DC

## 2019-06-19 MED ORDER — METHYLERGONOVINE MALEATE 0.2 MG/ML IJ SOLN
0.2000 mg | Freq: Once | INTRAMUSCULAR | Status: AC
  Administered 2019-06-19: 0.2 mg via INTRAMUSCULAR
  Filled 2019-06-19: qty 1

## 2019-06-19 NOTE — Consult Note (Addendum)
Name: Patricia Pearson MRN: [id number] DOB: 09-Oct-1996    ADMISSION DATE:  06/18/2019 CONSULTATION DATE: 06/19/2019  REFERRING MD : Dr. Estanislado Pandy   CHIEF COMPLAINT: Fever   BRIEF PATIENT DESCRIPTION:  23 yo female G1P1 s/p vaginal delivery 06/16/2019 required AROM and foley bulb induction for pre-eclampsia admitted to mother baby unit 08/13 with fevers secondary to UTI developed tachycardia and acute blood loss anemia requiring transfer to the stepdown unit   SIGNIFICANT EVENTS/STUDIES:  08/13-Pt admitted to mother baby unit with fevers and leukocytosis concerning for sepsis 08/14-Pt developed shortness of breath, chest tightness, tachycardia, hypoglycemia, and vaginal bleeding with clots rapid response initiated and pt transferred to the stepdown unit for closer monitoring   CULTURES: COVID-19 08/13>>negative  Blood x2 08/13>>negative  Urine 08/14>> Echo>> CT Abd Pelvis 08/14>> CTA Chest 08/14>>  HISTORY OF PRESENT ILLNESS:   This is a 23 yo female with a PMH of Kidney Stones, Menorrhagia, Chronic Tension Type Headaches, Dizziness, Anxiety, and Anemia.  She was previously admitted to [facility] on 06/15/2019 with atypical preeclampsia and underwent an induction and spontaneous vaginal delivery (gestational age at delivery [redacted]w[redacted]d on 06/16/2019.  Magnesium drip started for seizure prophylaxis at that time, however due to hypotension magnesium drip stopped less than 24 hrs postpartum.  She was discharged the morning of 06/18/2019 along with her infant.  [facility] per her OBGYN Dr. STelford Nabrecommendations with fevers onset of symptoms 30 minutes prior to presentation.  Prior to ER arrival she took 500 mg po tylenol, she also endorsed lower back pain and headache.  In the ER lab results revealed Na+ 132, K+ 3.1, glucose 107, alk phos 140, lactic acid 2.1, hgb 8.1, and platelets 151.  COVID-19 negative, CXR negative, and UA positive for UTI.  Vital  signs revealed temp 103.2 F, bp 116/74, and hr 162 bpm.  She received 1000 mg po tylenol, 40 meq iv K+, and 1L NS bolus.  She was subsequently admitted to the mother baby unit for additional workup and treatment.  On 08/14 pt developed shortness of breath, chest tightness, tachycardia, hypoglycemia, and vaginal bleeding with clots rapid response initiated and pt transferred to the stepdown unit for closer monitoring.    PAST MEDICAL HISTORY :   has a past medical history of Anemia, Anxiety, and Kidney stone.  has a past surgical history that includes Wisdom tooth extraction (2016) and STENT PLACEMENT RT URETER (ASeminoleHX) (Right, 04/09/2013). Prior to Admission medications   Medication Sig Start Date End Date Taking? Authorizing Provider  acetaminophen (TYLENOL) 325 MG tablet Take 650 mg by mouth every 6 (six) hours as needed.   Yes [provider]  benzocaine-Menthol (DERMOPLAST) 20-0.5 % AERO Apply 1 application topically as needed for irritation (perineal discomfort). 06/18/19  Yes Schermerhorn, TGwen Her MD  ferrous sulfate 325 (65 FE) MG EC tablet Take 325 mg by mouth daily.   Yes [provider]  ibuprofen (ADVIL) 200 MG tablet Take 200 mg by mouth every 6 (six) hours as needed.   Yes [provider]  ibuprofen (ADVIL) 600 MG tablet Take 1 tablet (600 mg total) by mouth every 6 (six) hours. 06/18/19  Yes Schermerhorn, TGwen Her MD  norethindrone (ORTHO MICRONOR) 0.35 MG tablet Take 1 tablet (0.35 mg total) by mouth daily. 06/18/19 06/17/20 Yes Schermerhorn, TGwen Her MD  Prenatal Vit-Fe Fumarate-FA (MULTIVITAMIN-PRENATAL) 27-0.8 MG TABS tablet Take 1 tablet by mouth daily at 12 noon.   Yes [provider]   No Known Allergies  FAMILY HISTORY:  family history includes Anxiety disorder in her mother; Basal cell carcinoma in her mother; Drug abuse in her father; Heart failure in her maternal grandfather; Lung cancer in her paternal grandmother; Peptic Ulcer in her  father. SOCIAL HISTORY:  reports that she has never smoked. She has never used smokeless tobacco. She reports that she does not drink alcohol or use drugs.  REVIEW OF SYSTEMS: Positives in BOLD  Constitutional: fever, chills, weight loss, malaise/fatigue and diaphoresis.  HENT: Negative for hearing loss, ear pain, nosebleeds, congestion, sore throat, neck pain, tinnitus and ear discharge.   Eyes: Negative for blurred vision, double vision, photophobia, pain, discharge and redness.  Respiratory: cough, hemoptysis, sputum production, shortness of breath, wheezing and stridor.   Cardiovascular: chest tightness, palpitations, orthopnea, claudication, leg swelling and PND.  Gastrointestinal: Negative for heartburn, nausea, vomiting, abdominal pain, diarrhea, constipation, blood in stool and melena.  Genitourinary: Negative for dysuria, urgency, frequency, hematuria and flank pain.  Musculoskeletal: Negative for myalgias, back pain, joint pain and falls.  Skin: Negative for itching and rash.  Neurological: Negative for dizziness, tingling, tremors, sensory change, speech change, focal weakness, seizures, loss of consciousness, weakness and headaches.  Endo/Heme/Allergies: Negative for environmental allergies and polydipsia. Does not bruise/bleed easily.  SUBJECTIVE:  No complaints at this time denies shortness of breath or chest pain   VITAL SIGNS: Temp:  [97.9 F (36.6 C)-103.2 F (39.6 C)] 103.2 F (39.6 C) (08/14 1328) Pulse Rate:  [109-162] 151 (08/14 1400) Resp:  [15-24] 15 (08/14 1400) BP: (101-161)/(58-84) 117/67 (08/14 1400) SpO2:  [98 %-100 %] 100 % (08/14 1400) Weight:  [74.4 kg-76.7 kg] 76.7 kg (08/14 1328)  PHYSICAL EXAMINATION: General: well developed, well nourished female, NAD  Neuro: alert and oriented, follows commands  HEENT: supple, no JVD Cardiovascular: sinus tachycardia, no R/G  Lungs: clear throughout, even, non labored  Abdomen: +BS x4, obese, soft, non tender,  non distended  Musculoskeletal: normal bulk and tone, no edema  Skin: intact no rashes or lesions present   Recent Labs  Lab 06/18/19 0717 06/18/19 1834 06/19/19 0445  NA 137 132* 135  K 4.0 3.1* 3.7  CL 105 102 109  CO2 22 21* 20*  BUN '6 6 7  '$ CREATININE 0.63 0.62 0.49  GLUCOSE 99 107* 115*   Recent Labs  Lab 06/18/19 0717 06/18/19 1834 06/19/19 0445  HGB 8.3* 8.1* 7.1*  HCT 26.2* 25.0* 22.3*  WBC 9.8 9.0 7.3  PLT 182 151 142*   Dg Chest Port 1 View  Result Date: 06/18/2019 CLINICAL DATA:  Fever today. Recent postpartum post vaginal delivery. EXAM: PORTABLE CHEST 1 VIEW COMPARISON:  None. FINDINGS: The cardiomediastinal contours are normal. Scattered subsegmental atelectasis. No confluent airspace disease. Pulmonary vasculature is normal. No large pleural effusion or pneumothorax. No acute osseous abnormalities are seen. IMPRESSION: Scattered subsegmental atelectasis. Electronically Signed   By: Keith Rake M.D.   On: 06/18/2019 18:59    ASSESSMENT / PLAN:  Sinus tachycardia likely secondary to fever and infectious process (troponin negative) Continuous telemetry monitoring  Prn EKG  CTA Chest to r/o pulmonary embolism   G1P1 s/p vaginal delivery 06/16/2019 required AROM and foley bulb induction for pre-eclampsia CT Abd Pelvis results pending  Monitor vaginal discharge   Acute blood loss anemia (s/p vaginal delivery 06/16/2019) VTE px: SCD's, avoid chemical prophylaxis for now  Trend CBC  Monitor for s/sx of bleeding and transfuse for hgb <7 Supplemental O2 for dyspnea  and/or hypoxia   Urinary tract infection  Trend WBC and monitor fever curve Trend PCT  Follow cultures  Continue ceftriaxone    Marda Stalker, [place] Pager [phone] (please enter 7 digits) PCCM Consult Pager [phone] (please enter 7 digits)   PCCM ATTENDING ATTESTATION: I have evaluated patient with the APP [place], reviewed database in its entirety and  discussed care plan in detail. In addition, this patient was discussed on multidisciplinary rounds. I have personally reviewed all chest radiographs discussed herein including CXRs and CT chest unless otherwise indicated  She is in no overt distress Low-grade fevers persist Tachycardia persists (sinus) Exam is otherwise essentially normal CT abdomen reveals findings consistent with pyelonephritis   Major problems addressed by PCCM team: Postpartum state Recent preeclampsia Acute blood loss anemia Severe sepsis Sinus tachycardia R pyelonephritis Rule out peripartum cardiomyopathy   PLAN/REC: As outlined above.  Due to the findings of pyelonephritis, I have changed antibiotics from ceftriaxone to meropenem.  Caution with volume resuscitation until we better understand her cardiac status.    Merton Border, MD PCCM service Mobile [phone] Pager [phone] 06/19/2019 6:08 PM

## 2019-06-19 NOTE — Progress Notes (Signed)
Patient significant other at bedside.

## 2019-06-19 NOTE — Consult Note (Signed)
Name: Patricia Pearson MRN: [id number] DOB: 08/17/1996    ADMISSION DATE:  06/18/2019 Progress note DATE: 06/19/2019  CHIEF COMPLAINT: Fever   BRIEF PATIENT DESCRIPTION:  23 yo female G1P1 s/p vaginal delivery 06/16/2019 with induction of labor for suspected atypical pre-eclampsia with sinus tach, unremitting headache and new-onset significant proteinuria, admitted to mother baby unit 08/13 with fevers secondary to presumed UTI with intermittently worsening tachycardia and acute blood loss anemia requiring transfer to the stepdown unit   SIGNIFICANT EVENTS/STUDIES:  08/13-Pt admitted to mother baby unit with fevers and leukocytosis concerning for sepsis 08/14-Pt developed shortness of breath, chest tightness, tachycardia, hypoglycemia, and vaginal bleeding with clots rapid response initiated and pt transferred to the stepdown unit for closer monitoring   CULTURES: COVID-19 08/13>>negative  Blood x2 08/13>>negative   Urine 08/14>> Echo>> pending  CT Abd Pelvis 08/14>> IMPRESSION: 1. Pyelonephritis of the RIGHT kidney, of a fairly severe degree, with edema throughout the RIGHT renal cortex and prominent perinephric fluid/inflammation. Questionable additional pyelonephritis at the upper pole of the LEFT kidney, but not convincing. 2. Mild bilateral hydronephrosis, likely physiologic related to the recently gravid uterus. 3. 1 mm RIGHT renal stone. 4. Fairly large amount of stool within the rectosigmoid colon (constipation? ). 5. Small bilateral pleural effusions, with associated mild atelectasis. 6. No abscess collection identified. However, would consider follow-up CT abdomen within 1 week to exclude early developing abscess within the RIGHT renal cortex  CTA Chest 08/14>> IMPRESSION: 1. No pulmonary embolus seen, with mild study limitations detailed above. 2. Mixed nodular and ground-glass consolidations within the RIGHT lower lobe and along the posterior margin of the  RIGHT upper lobe. Some component may represent atelectasis. Differential includes atypical pneumonias such as viral or fungal, interstitial pneumonias, edema related to volume overload/CHF, hypersensitivity pneumonitis, and respiratory bronchiolitis. 3. Small bilateral pleural effusions, RIGHT slightly greater than LEFT. 4. Heart size is upper normal. No pericardial effusion.  HISTORY OF PRESENT ILLNESS:   23 yo G1P1 PPD#4 from NSVD with a PMH of Kidney Stones, Menorrhagia, Chronic Tension Type Headaches, Dizziness, Anxiety, and Anemia.  She was previously admitted to [facility] on 06/15/2019 with atypical preeclampsia and underwent an induction and spontaneous vaginal delivery (gestational age at delivery [redacted]w[redacted]d on 06/16/2019.  Magnesium drip started for seizure prophylaxis at that time, and discontinued due to hypotension magnesium drip less than 24 hrs postpartum.    She was discharged the morning of 06/18/2019 along with her infant.  She presented back to [facility] the evening of 08/13 per her OBGYN Dr. TMarcello MooresSchermerhorn's recommendations with fevers onset of symptoms 30 minutes prior to presentation.  Prior to ER arrival she took 500 mg po tylenol, she also endorsed lower back pain and headache.  In the ER lab results revealed Na+ 132, K+ 3.1, glucose 107, alk phos 140, lactic acid 2.1, hgb 8.1, and platelets 151.  COVID-19 negative, CXR negative, and UA positive for UTI.  Vital signs revealed temp 103.2 F, bp 116/74, and hr 162 bpm.  She received 1000 mg po tylenol, 40 meq iv K+, and 1L NS bolus.  She was subsequently admitted to the mother baby unit for additional workup and treatment.  On 08/14 pt developed shortness of breath, chest tightness, tachycardia, hypoglycemia (BG POC 58), and vaginal bleeding with clots. Rapid response initiated and pt transferred to the stepdown unit for closer monitoring.    PAST MEDICAL HISTORY :   has a past medical history of Anemia, Anxiety, and Kidney stone.  has  a past surgical history that includes Wisdom tooth extraction (2016) and STENT PLACEMENT RT URETER ([place] HX) (Right, 04/09/2013). Prior to Admission medications   Medication Sig Start Date End Date Taking? Authorizing Provider  acetaminophen (TYLENOL) 325 MG tablet Take 650 mg by mouth every 6 (six) hours as needed.   Yes [provider]  benzocaine-Menthol (DERMOPLAST) 20-0.5 % AERO Apply 1 application topically as needed for irritation (perineal discomfort). 06/18/19  Yes Schermerhorn, Gwen Her, MD  ferrous sulfate 325 (65 FE) MG EC tablet Take 325 mg by mouth daily.   Yes [provider]  ibuprofen (ADVIL) 200 MG tablet Take 200 mg by mouth every 6 (six) hours as needed.   Yes [provider]  ibuprofen (ADVIL) 600 MG tablet Take 1 tablet (600 mg total) by mouth every 6 (six) hours. 06/18/19  Yes Schermerhorn, Gwen Her, MD  norethindrone (ORTHO MICRONOR) 0.35 MG tablet Take 1 tablet (0.35 mg total) by mouth daily. 06/18/19 06/17/20 Yes Schermerhorn, Gwen Her, MD  Prenatal Vit-Fe Fumarate-FA (MULTIVITAMIN-PRENATAL) 27-0.8 MG TABS tablet Take 1 tablet by mouth daily at 12 noon.   Yes [provider]   No Known Allergies  FAMILY HISTORY:  family history includes Anxiety disorder in her mother; Basal cell carcinoma in her mother; Drug abuse in her father; Heart failure in her maternal grandfather; Lung cancer in her paternal grandmother; Peptic Ulcer in her father. SOCIAL HISTORY:  reports that she has never smoked. She has never used smokeless tobacco. She reports that she does not drink alcohol or use drugs.  REVIEW OF SYSTEMS: Positives in BOLD  Constitutional: fever, chills, weight loss, malaise/fatigue and diaphoresis.  HENT: Negative for hearing loss, ear pain, nosebleeds, congestion, sore throat, neck pain, tinnitus and ear discharge.   Eyes: Negative for blurred vision, double vision, photophobia, pain, discharge and redness.  Respiratory: cough,  hemoptysis, sputum production, shortness of breath, wheezing and stridor.   Cardiovascular: chest tightness, palpitations, orthopnea, claudication, leg swelling and PND.  Gastrointestinal: Negative for heartburn, nausea, vomiting, abdominal pain, diarrhea, constipation, blood in stool and melena.  Genitourinary: Negative for dysuria, urgency, frequency, hematuria and flank pain.  Musculoskeletal: Negative for myalgias, back pain, joint pain and falls.  Skin: Negative for itching and rash.  Neurological: Negative for dizziness, tingling, tremors, sensory change, speech change, focal weakness, seizures, loss of consciousness, weakness and headaches.  Endo/Heme/Allergies: Negative for environmental allergies and polydipsia. Does not bruise/bleed easily.  SUBJECTIVE:  No complaints at this time; denies shortness of breath or chest pain- these sx come on intermittently  VITAL SIGNS: Temp:  [97.9 F (36.6 C)-103.2 F (39.6 C)] 99.1 F (37.3 C) (08/14 1600) Pulse Rate:  [109-162] 128 (08/14 1700) Resp:  [14-24] 14 (08/14 1700) BP: (90-161)/(43-84) 95/57 (08/14 1700) SpO2:  [97 %-100 %] 100 % (08/14 1700) Weight:  [74.4 kg-76.7 kg] 76.7 kg (08/14 1328)  PHYSICAL EXAMINATION: General: well developed, well nourished female, NAD. Somewhat pale Neuro: alert and oriented, follows commands  HEENT: supple, Cardiovascular: sinus tachycardia, no R/G  Lungs: clear throughout, even, non labored  Abdomen: +BS x4, obese, soft, non tender, non distended. Fundus firm and non tender, 5 cm below umbilicus  Musculoskeletal: normal bulk and tone, no edema  Skin: intact no rashes or lesions present   Recent Labs  Lab 06/18/19 1834 06/19/19 0445 06/19/19 1504  NA 132* 135 137  K 3.1* 3.7 3.2*  CL 102 109 109  CO2 21* 20* 20*  BUN '6 7 7  '$ CREATININE 0.62 0.49  0.50  GLUCOSE 107* 115* 97  LFTS: AST elevated  Procalcitonin wnl 4.18  Recent Labs  Lab 06/18/19 1834 06/19/19 0445 06/19/19 1504  HGB  8.1* 7.1* 8.6*  HCT 25.0* 22.3* 26.1*  WBC 9.0 7.3 7.9  PLT 151 142* 139*   Ct Angio Chest Pe W Or Wo Contrast  Result Date: 06/19/2019 CLINICAL DATA:  Chest tightness and fever. EXAM: CT ANGIOGRAPHY CHEST WITH CONTRAST TECHNIQUE: Multidetector CT imaging of the chest was performed using the standard protocol during bolus administration of intravenous contrast. Multiplanar CT image reconstructions and MIPs were obtained to evaluate the vascular anatomy. CONTRAST:  76m OMNIPAQUE IOHEXOL 350 MG/ML SOLN COMPARISON:  None. FINDINGS: Cardiovascular: Some of the central and peripheral pulmonary artery branches are difficult to characterize due to mild patient motion artifact and blood flow artifact, however, there is no convincing pulmonary embolism identified within the main, lobar or segmental pulmonary arteries bilaterally. No occlusive pulmonary embolus identified. No thoracic aortic aneurysm or evidence of aortic dissection. Heart size is upper normal. No pericardial effusion. Mediastinum/Nodes: No mass or enlarged lymph nodes seen within the mediastinum or perihilar regions. Esophagus appears normal. Trachea and central bronchi are unremarkable. Lungs/Pleura: Mixed nodular and ground-glass consolidation within the RIGHT lower lobe and along the posterior margin of the RIGHT upper lobe. Some component may represent atelectasis. Small bilateral pleural effusions w, RIGHT slightly greater than LEFT. Upper Abdomen: Limited images of the upper abdomen are unremarkable. Musculoskeletal: No acute or suspicious osseous finding. Review of the MIP images confirms the above findings. IMPRESSION: 1. No pulmonary embolus seen, with mild study limitations detailed above. 2. Mixed nodular and ground-glass consolidations within the RIGHT lower lobe and along the posterior margin of the RIGHT upper lobe. Some component may represent atelectasis. Differential includes atypical pneumonias such as viral or fungal, interstitial  pneumonias, edema related to volume overload/CHF, hypersensitivity pneumonitis, and respiratory bronchiolitis. 3. Small bilateral pleural effusions, RIGHT slightly greater than LEFT. 4. Heart size is upper normal. No pericardial effusion. Electronically Signed   By: SFranki CabotM.D.   On: 06/19/2019 16:18   Ct Abdomen Pelvis W Contrast  Result Date: 06/19/2019 CLINICAL DATA:  Chest tightness and fever. Recent vaginal delivery. Low back pain and headache. EXAM: CT ABDOMEN AND PELVIS WITH CONTRAST TECHNIQUE: Multidetector CT imaging of the abdomen and pelvis was performed using the standard protocol following bolus administration of intravenous contrast. CONTRAST:  722mOMNIPAQUE IOHEXOL 350 MG/ML SOLN COMPARISON:  None. FINDINGS: Lower chest: Small bilateral pleural effusions, with associated mild atelectasis. Hepatobiliary: No focal liver abnormality is seen. No gallstones, gallbladder wall thickening, or biliary dilatation. Pancreas: Unremarkable. No pancreatic ductal dilatation or surrounding inflammatory changes. Spleen: Normal in size without focal abnormality. Adrenals/Urinary Tract: Adrenal glands appear normal. Ill-defined edema throughout of the majority of the RIGHT renal cortex, indicating pyelonephritis. Associated perinephric fluid/inflammation. Questionable additional edema at the upper pole of the LEFT kidney, but not definitive. Mild bilateral hydronephrosis ww, likely physiologic related to the recently gravid uterus. 1 mm RIGHT renal stone Stomach/Bowel: z fairly large amount of stool within the rectosigmoid colon. Or proximal segments of the large bowel are normal in caliber. No small bowel dilatation. Stomach is unremarkable, partially decompressed. Appendix is normal. Vascular/Lymphatic: No significant vascular findings are present. No enlarged abdominal or pelvic lymph nodes. Reproductive: Enlarged uterus, compatible with the given history of recent pregnancy. Other: Small amount of free  fluid in the pelvis. No abscess collection identified. No free intraperitoneal air. Musculoskeletal: No acute or  suspicious osseous finding. IMPRESSION: 1. Pyelonephritis of the RIGHT kidney, of a fairly severe degree, with edema throughout the RIGHT renal cortex and prominent perinephric fluid/inflammation. Questionable additional pyelonephritis at the upper pole of the LEFT kidney, but not convincing. 2. Mild bilateral hydronephrosis, likely physiologic related to the recently gravid uterus. 3. 1 mm RIGHT renal stone. 4. Fairly large amount of stool within the rectosigmoid colon (constipation? ). 5. Small bilateral pleural effusions, with associated mild atelectasis. 6. No abscess collection identified. However, would consider follow-up CT abdomen within 1 week to exclude early developing abscess within the RIGHT renal cortex. Electronically Signed   By: Franki Cabot M.D.   On: 06/19/2019 16:42   Dg Chest Port 1 View  Result Date: 06/18/2019 CLINICAL DATA:  Fever today. Recent postpartum post vaginal delivery. EXAM: PORTABLE CHEST 1 VIEW COMPARISON:  None. FINDINGS: The cardiomediastinal contours are normal. Scattered subsegmental atelectasis. No confluent airspace disease. Pulmonary vasculature is normal. No large pleural effusion or pneumothorax. No acute osseous abnormalities are seen. IMPRESSION: Scattered subsegmental atelectasis. Electronically Signed   By: Keith Rake M.D.   On: 06/18/2019 18:59    ASSESSMENT / PLAN:   Urinary tract infection, pyelonephritis of RIGHT kidney on CT scan, WBC wnl Trend WBC and monitor fever curve Trend PCT  Follow cultures, blood and urine pending Consider Uterine culture if no clinical improvement Continue ceftriaxone, s/p gent/clind from the ER  Sinus tachycardia likely secondary to fever and infectious process (troponin negative) Continuous telemetry monitoring  Prn EKG  CTA Chest negative for pulmonary embolism   G1P1 s/p vaginal delivery at  38wks on 06/16/2019 induction for possible atypical  pre-eclampsia. Intrauterine clot evacuated on 3rd floor 8/14 with increased lochia - lactation consult PRN - pump to patient's room if desired -continue monitoring lochia, currently norma -CT Abd Pelvis results without evidence of endometritis  Acute blood loss anemia (s/p vaginal delivery 06/16/2019) S/p partial unit of pRBCs 8/14, out of 2 units ordered VTE px: SCD's, avoid chemical prophylaxis for now  Trend CBC  Monitor for s/sx of bleeding and transfuse for hgb <7 Supplemental O2 for dyspnea and/or hypoxia

## 2019-06-19 NOTE — Consult Note (Signed)
Pharmacy Antibiotic Note  Patricia Pearson is a 23 y.o. female admitted on 06/18/2019 with UTI.  Pharmacy has been consulted for Meropenem dosing.  Plan: Meropenem 1g q8h  Height: 5\' 3"  (160 cm) Weight: 169 lb 1.5 oz (76.7 kg) IBW/kg (Calculated) : 52.4  Temp (24hrs), Avg:99.7 F (37.6 C), Min:97.9 F (36.6 C), Max:103.2 F (39.6 C)  Recent Labs  Lab 06/15/19 1515 06/17/19 0723 06/18/19 0717 06/18/19 1834 06/18/19 2058 06/19/19 0445 06/19/19 1504  WBC 8.4 9.9 9.8 9.0  --  7.3 7.9  CREATININE 0.46  --  0.63 0.62  --  0.49 0.50  LATICACIDVEN  --   --   --  2.1* 0.8  --  1.3    Estimated Creatinine Clearance: 110 mL/min (by C-G formula based on SCr of 0.5 mg/dL).    No Known Allergies  Antimicrobials this admission: Gentamicin 8/13 x 1 CTX 8/14 x 1 Clindamycin 8/13 >> 8/14 Meropenem 8/14 >>   Dose adjustments this admission: None  Microbiology results: 8/13 BCx: pending 8/13 UCx: pending   Thank you for allowing pharmacy to be a part of this patient's care.  Lu Duffel, PharmD, BCPS Clinical Pharmacist 06/19/2019 6:14 PM

## 2019-06-19 NOTE — Progress Notes (Signed)
Patient was tachycardic with a heart rate of 150 bpm, short of breath Rapid response was called Had began to bleed Currently on PR VC transfusion IV Transferred to stepdown for close monitoring Pelvic ultrasound to rule out endometritis CTA chest rule out PE CT abdomen rule out any abscess IV antibiotics to continue Intensivist informed

## 2019-06-19 NOTE — Progress Notes (Signed)
   06/19/19 1300  Clinical Encounter Type  Visited With Other (Comment)  Visit Type Initial  Referral From Nurse  Consult/Referral To Chaplain  Chaplain received page for RR and arrive at room. Care team was working with patient. Chaplain made pastoral presence known and ask if she was needed, nurse said everything was ok. Chaplain said call if needed. Patient was move from Mother [phone]) that is were [place] visit.

## 2019-06-19 NOTE — Consult Note (Signed)
[facility] Consult[facility]  [name] AOZ:[id number] DOB: November 21, 1995 DOA: 06/18/2019 PCP: Olin Hauser, DO   Requesting physician: Schermerhorn MD Date of consultation: 06/19/2019 Reason for consultation: Fever  CHIEF COMPLAINT:   Chief Complaint  Patient presents with  . Fever    HISTORY OF PRESENT ILLNESS: Patricia Pearson  is a 23 y.o. female with a known history of anxiety disorder, anemia currently under the labor delivery service.  Patient had normal vaginal delivery.  Hospital service consulted for fever.  No complaints of cough.  Has some chest tightness.  Also had some vaginal bleeding this morning.  OB/GYN service planning to give PRBC transfusion.  Currently patient on gentamicin and clindamycin antibiotics.  Cultures no evidence of growth so far.  PAST MEDICAL HISTORY:   Past Medical History:  Diagnosis Date  . Anemia   . Anxiety   . Kidney stone     PAST SURGICAL HISTORY:  Past Surgical History:  Procedure Laterality Date  . STENT PLACEMENT RT URETER ([place] HX) Right 04/09/2013  . WISDOM TOOTH EXTRACTION  2016    SOCIAL HISTORY:  Social History   Tobacco Use  . Smoking status: Never Smoker  . Smokeless tobacco: Never Used  Substance Use Topics  . Alcohol use: No    FAMILY HISTORY:  Family History  Problem Relation Age of Onset  . Basal cell carcinoma Mother   . Anxiety disorder Mother   . Drug abuse Father   . Peptic Ulcer Father   . Heart failure Maternal Grandfather   . Lung cancer Paternal Grandmother   . Colon cancer Neg Hx   . Cervical cancer Neg Hx   . Breast cancer Neg Hx     DRUG ALLERGIES: No Known Allergies  REVIEW OF SYSTEMS:   CONSTITUTIONAL: No fever, fatigue or weakness.  EYES: No blurred or double vision.  EARS, NOSE, AND THROAT: No tinnitus or ear pain.  RESPIRATORY: No cough, shortness of breath, wheezing or hemoptysis.  CARDIOVASCULAR: Had chest tightness, orthopnea, edema.  GASTROINTESTINAL:  No nausea, vomiting, diarrhea or abdominal pain.  GENITOURINARY: Has dysuria,no hematuria.  ENDOCRINE: No polyuria, nocturia,  HEMATOLOGY: No anemia, easy bruising or bleeding SKIN: No rash or lesion. MUSCULOSKELETAL: No joint pain or arthritis.   NEUROLOGIC: No tingling, numbness, weakness.  PSYCHIATRY: No anxiety or depression.   MEDICATIONS AT HOME:  Prior to Admission medications   Medication Sig Start Date End Date Taking? Authorizing Provider  acetaminophen (TYLENOL) 325 MG tablet Take 650 mg by mouth every 6 (six) hours as needed.   Yes [provider]  benzocaine-Menthol (DERMOPLAST) 20-0.5 % AERO Apply 1 application topically as needed for irritation (perineal discomfort). 06/18/19  Yes Schermerhorn, Gwen Her, MD  ferrous sulfate 325 (65 FE) MG EC tablet Take 325 mg by mouth daily.   Yes [provider]  ibuprofen (ADVIL) 200 MG tablet Take 200 mg by mouth every 6 (six) hours as needed.   Yes [provider]  ibuprofen (ADVIL) 600 MG tablet Take 1 tablet (600 mg total) by mouth every 6 (six) hours. 06/18/19  Yes Schermerhorn, Gwen Her, MD  norethindrone (ORTHO MICRONOR) 0.35 MG tablet Take 1 tablet (0.35 mg total) by mouth daily. 06/18/19 06/17/20 Yes Schermerhorn, Gwen Her, MD  Prenatal Vit-Fe Fumarate-FA (MULTIVITAMIN-PRENATAL) 27-0.8 MG TABS tablet Take 1 tablet by mouth daily at 12 noon.   Yes [provider]      PHYSICAL EXAMINATION:   VITAL SIGNS: Blood pressure (!) 161/76, pulse (!) 152, temperature  100.1 F (37.8 C), temperature source Oral, resp. rate 16, height 5\' 3"  (1.6 m), weight 74.4 kg, SpO2 100 %, unknown if currently breastfeeding.  GENERAL:  23 y.o.-year-old patient lying in the bed with no acute distress.  EYES: Pupils equal, round, reactive to light and accommodation. No scleral icterus. Extraocular muscles intact.  HEENT: Head atraumatic, normocephalic. Oropharynx and nasopharynx clear.  NECK:  Supple, no jugular venous  distention. No thyroid enlargement, no tenderness.  LUNGS: Normal breath sounds bilaterally, no wheezing, rales,rhonchi or crepitation. No use of accessory muscles of respiration.  CARDIOVASCULAR: S1, S2 tachycardia. No murmurs, rubs, or gallops.  ABDOMEN: Soft, nontender, nondistended. Bowel sounds present. No organomegaly or mass.  EXTREMITIES: No pedal edema, cyanosis, or clubbing.  NEUROLOGIC: Cranial nerves II through XII are intact. Muscle strength 5/5 in all extremities. Sensation intact. Gait not checked.  PSYCHIATRIC: The patient is alert and oriented x 3.  SKIN: No obvious rash, lesion, or ulcer.   LABORATORY PANEL:   CBC Recent Labs  Lab 06/15/19 1515 06/17/19 0723 06/18/19 0717 06/18/19 1834 06/19/19 0445  WBC 8.4 9.9 9.8 9.0 7.3  HGB 9.6* 8.6* 8.3* 8.1* 7.1*  HCT 29.2* 26.7* 26.2* 25.0* 22.3*  PLT 200 163 182 151 142*  MCV 88.0 87.8 88.2 87.7 88.1  MCH 28.9 28.3 27.9 28.4 28.1  MCHC 32.9 32.2 31.7 32.4 31.8  RDW 13.2 13.1 13.2 13.2 13.3  LYMPHSABS  --   --   --  0.5*  --   MONOABS  --   --   --  0.5  --   EOSABS  --   --   --  0.0  --   BASOSABS  --   --   --  0.0  --    ------------------------------------------------------------------------------------------------------------------  Chemistries  Recent Labs  Lab 06/15/19 1515 06/16/19 0638 06/18/19 0717 06/18/19 1834 06/19/19 0445  NA 136  --  137 132* 135  K 3.9  --  4.0 3.1* 3.7  CL 104  --  105 102 109  CO2 23  --  22 21* 20*  GLUCOSE 85  --  99 107* 115*  BUN <5*  --  6 6 7   CREATININE 0.46  --  0.63 0.62 0.49  CALCIUM 8.9  --  8.2* 8.0* 7.8*  MG  --  4.0*  --   --   --   AST 11*  --  22 24 28   ALT 9  --  11 13 15   ALKPHOS 228*  --  151* 140* 122  BILITOT 0.4  --  0.3 0.4 0.5   ------------------------------------------------------------------------------------------------------------------ estimated creatinine clearance is 108.4 mL/min (by C-G formula based on SCr of 0.49  mg/dL). ------------------------------------------------------------------------------------------------------------------ No results for input(s): TSH, T4TOTAL, T3FREE, THYROIDAB in the last 72 hours.  Invalid input(s): FREET3   Coagulation profile No results for input(s): INR, PROTIME in the last 168 hours. ------------------------------------------------------------------------------------------------------------------- No results for input(s): DDIMER in the last 72 hours. -------------------------------------------------------------------------------------------------------------------  Cardiac Enzymes No results for input(s): CKMB, TROPONINI, MYOGLOBIN in the last 168 hours.  Invalid input(s): CK ------------------------------------------------------------------------------------------------------------------ Invalid input(s): POCBNP  ---------------------------------------------------------------------------------------------------------------  Urinalysis    Component Value Date/Time   COLORURINE YELLOW (A) 06/18/2019 1835   APPEARANCEUR CLOUDY (A) 06/18/2019 1835   LABSPEC 1.017 06/18/2019 1835   PHURINE 6.0 06/18/2019 1835   GLUCOSEU NEGATIVE 06/18/2019 1835   [place] (A) 06/18/2019 [place] NEGATIVE 06/18/2019 [place] Negative 06/02/2018 0948   KETONESUR 5 (A) 06/18/2019 1835  PROTEINUR 100 (A) 06/18/2019 1835   UROBILINOGEN 0.2 06/02/2018 0948   NITRITE NEGATIVE 06/18/2019 1835   LEUKOCYTESUR MODERATE (A) 06/18/2019 1835     RADIOLOGY: Dg Chest Port 1 View  Result Date: 06/18/2019 CLINICAL DATA:  Fever today. Recent postpartum post vaginal delivery. EXAM: PORTABLE CHEST 1 VIEW COMPARISON:  None. FINDINGS: The cardiomediastinal contours are normal. Scattered subsegmental atelectasis. No confluent airspace disease. Pulmonary vasculature is normal. No large pleural effusion or pneumothorax. No acute osseous abnormalities are seen.  IMPRESSION: Scattered subsegmental atelectasis. Electronically Signed   By: Keith Rake M.D.   On: 06/18/2019 18:59    EKG: Orders placed or performed during the hospital encounter of 06/18/19  . EKG 12-Lead  . EKG 12-Lead  . EKG 12-Lead  . EKG 12-Lead    IMPRESSION AND PLAN:  23 year old female patient with history of anxiety disorder, anemia currently under OB/GYN service status post normal delivery  -Acute UTI start IV Rocephin antibiotic Follow-up cultures  -Acute symptomatic anemia Secondary to vaginal bleeding Pelvic ultrasound PRBC transfusion Transfuse at least 2 units PRBC IV  -Tachycardia Secondary to anemia and fever IV fluids  -Fever secondary to UTI IV antibiotics  -Atypical chest tightness Cycle troponin Check echocardiogram for new cardiomyopathy   All the records are reviewed and case discussed with ED provider. Management plans discussed with the patient, family and they are in agreement.  CODE STATUS:Full code    Code Status Orders  (From admission, onward)         Start     Ordered   06/18/19 1910  Full code  Continuous     06/18/19 1915        Code Status History    Date Active Date Inactive Code Status Order ID Comments User Context   06/17/2019 0710 06/18/2019 1615 Full Code [id number]  Lisette Grinder, CNM Inpatient   06/15/2019 1908 06/17/2019 0710 Full Code [id number]  Schermerhorn, Gwen Her, MD Inpatient   04/12/2019 1616 04/13/2019 2146 Full Code [id number]  Francetta Found CNM Inpatient   04/11/2019 1751 04/11/2019 2325 Full Code [id number]  McVey, Murray Hodgkins, CNM Inpatient   Advance Care Planning Activity       TOTAL TIME TAKING CARE OF THIS PATIENT: 52 minutes.    Saundra Shelling M.D on 06/19/2019 at 12:47 PM  Between 7am to 6pm - Pager - [phone]  After 6pm go to [email or web address] - Proofreader  [facility]  [phone]  CC: Primary care physician; Olin Hauser, DO

## 2019-06-19 NOTE — Progress Notes (Addendum)
Pt called RN to room because she stated "she was bleeding a lot" RN enters room and patients peri pad is completely saturated , chux pad also saturated. Weight 150 of blood. Pt said she had just used the bathroom and changed her pad 45 min ago. Her bleeding at this point has been very scant. Pink tinged. Joni Fears CNM at bedside and gave verbal order to give IM methergine. Given by RN. Ok to give even though pt was previously pre eclamptic, per CNM. Dr. Estanislado Pandy entered the room to assess the patient. Ordered 2 units of blood, and changed the patients antibiotics. Then Dr. Leonides Schanz entered the room and did a bedside ultrasound, which revealed a clot. Verbal order given to give 0.5 mg of dilaudid and clot manually extracted at bedside. EBL Weighing 100.

## 2019-06-19 NOTE — Progress Notes (Signed)
   06/19/19 1400  Clinical Encounter Type  Visited With Patient  Visit Type Follow-up  Referral From Nurse  Consult/Referral To Chaplain  Spiritual Encounters  Spiritual Needs Emotional  Chaplain visit to check on patient to see if she needed anything. Patient said she was feeling better and she did not need anything at this time.

## 2019-06-19 NOTE — Progress Notes (Signed)
Nurse called to the room because pt was complaining of being cold, and wanted her temp taken because she was shaking. Patient very pale (more pale than she was when I left the room 20 minutes prior) lips and hands were cyantoic and cold to touch. Dr. Leafy Ro, Raquel Sarna RN, and Abby RN at bedside. Vitals taken, blood transfusion and antibiotic paused. Spo2 WNL, BP, HR, and temp elevated (see flowsheet). Pt stated "this is how she felt yesterday when she came to the ER". Rapid Response called. Blood bank notified of possible reaction and said to consult the provider on whether or not to stop transfusion. Verbal order given from Dr. Leafy Ro to continue blood transfusion and antibiotic due to unlikelihood of patients status being from transfusion reaction. CBG checked, low 58, treated with 4oz of orange juice. CBG was 76 at 15 min recheck. ICU charge nurse recommended giving D50 (full amp). Given to patient before transfer. Patient stabilized and transferred to ICU. Notified Dr. Estanislado Pandy of transfer.

## 2019-06-19 NOTE — Progress Notes (Addendum)
Pt complaining of SOB and chest tightness. Joni Fears, CNM on unit and notified. Vitals WNL. HR 109, RR 16, spo2 100%. Stat EKG and STAT hospitalist consult ordered

## 2019-06-19 NOTE — Progress Notes (Signed)
*LATE ENTRY*  Obstetric and Gynecology  HD # 2  Subjective  Patient reported chest tightness and shortness of breath earlier this morning. Feeling better now.    Objective  Objective:   Vitals:   06/19/19 0327 06/19/19 0445 06/19/19 0724 06/19/19 0855  BP: 105/62  108/74   Pulse: (!) 131  (!) 109 (!) 109  Resp: 20  18 16   Temp: (!) 102.8 F (39.3 C) 98.7 F (37.1 C) 97.9 F (36.6 C)   TempSrc: Oral Oral Oral   SpO2: 99%  99% 100%  Weight:      Height:       Temp:  [97.9 F (36.6 C)-103.2 F (39.6 C)] 97.9 F (36.6 C) (08/14 0724) Pulse Rate:  [109-162] 109 (08/14 0855) Resp:  [16-24] 16 (08/14 0855) BP: (101-127)/(58-74) 108/74 (08/14 0724) SpO2:  [98 %-100 %] 100 % (08/14 0855) Weight:  [74.4 kg] 74.4 kg (08/13 2152) I/O last 3 completed shifts: In: 1145.4 [I.V.:591.8; IV Piggyback:553.6] Out: 800 [Urine:800] No intake/output data recorded.  Intake/Output Summary (Last 24 hours) at 06/19/2019 0900 Last data filed at 06/19/2019 0244 Gross per 24 hour  Intake 1145.39 ml  Output 800 ml  Net 345.39 ml     Current Vital Signs 24h Vital Sign Ranges  T 97.9 F (36.6 C) Temp  Avg: 100.1 F (37.8 C)  Min: 97.9 F (36.6 C)  Max: 103.2 F (39.6 C)  BP 108/74 BP  Min: 101/68  Max: 127/65  HR (!) 109(pt c/o of sob  rn e.riley notified of vs) Pulse  Avg: 131.7  Min: 109  Max: 162  RR 16 Resp  Avg: 19.6  Min: 16  Max: 24  SaO2 100 % (room air) SpO2  Avg: 99.4 %  Min: 98 %  Max: 100 %           24 Hour I/O Current Shift I/O  Time Ins Outs 08/13 0701 - 08/14 0700 In: 1145.4 [I.V.:591.8] Out: 800 [Urine:800] No intake/output data recorded.   General: NAD Cardiovascular: RRR, no murmurs Pulmonary: CTAB, normal respiratory effort Abdomen: Benign. Non-tender, +BS, no guarding. Extremities: No erythema or cords, no calf tenderness, with normal peripheral pulses.  Labs: Results for orders placed or performed during the hospital encounter of 06/18/19 (from the  past 24 hour(s))  Comprehensive metabolic panel     Status: Abnormal   Collection Time: 06/18/19  6:34 PM  Result Value Ref Range   Sodium 132 (L) 135 - 145 mmol/L   Potassium 3.1 (L) 3.5 - 5.1 mmol/L   Chloride 102 98 - 111 mmol/L   CO2 21 (L) 22 - 32 mmol/L   Glucose, Bld 107 (H) 70 - 99 mg/dL   BUN 6 6 - 20 mg/dL   Creatinine, Ser 0.62 0.44 - 1.00 mg/dL   Calcium 8.0 (L) 8.9 - 10.3 mg/dL   Total Protein 5.6 (L) 6.5 - 8.1 g/dL   Albumin 2.4 (L) 3.5 - 5.0 g/dL   AST 24 15 - 41 U/L   ALT 13 0 - 44 U/L   Alkaline Phosphatase 140 (H) 38 - 126 U/L   Total Bilirubin 0.4 0.3 - 1.2 mg/dL   GFR calc non Af Amer >60 >60 mL/min   GFR calc Af Amer >60 >60 mL/min   Anion gap 9 5 - 15  Lactic acid, plasma     Status: Abnormal   Collection Time: 06/18/19  6:34 PM  Result Value Ref Range   Lactic Acid, Venous 2.1 (HH) 0.5 -  1.9 mmol/L  CBC with Differential     Status: Abnormal   Collection Time: 06/18/19  6:34 PM  Result Value Ref Range   WBC 9.0 4.0 - 10.5 K/uL   RBC 2.85 (L) 3.87 - 5.11 MIL/uL   Hemoglobin 8.1 (L) 12.0 - 15.0 g/dL   HCT 25.0 (L) 36.0 - 46.0 %   MCV 87.7 80.0 - 100.0 fL   MCH 28.4 26.0 - 34.0 pg   MCHC 32.4 30.0 - 36.0 g/dL   RDW 13.2 11.5 - 15.5 %   Platelets 151 150 - 400 K/uL   nRBC 0.0 0.0 - 0.2 %   Neutrophils Relative % 88 %   Neutro Abs 8.0 (H) 1.7 - 7.7 K/uL   Lymphocytes Relative 5 %   Lymphs Abs 0.5 (L) 0.7 - 4.0 K/uL   Monocytes Relative 6 %   Monocytes Absolute 0.5 0.1 - 1.0 K/uL   Eosinophils Relative 0 %   Eosinophils Absolute 0.0 0.0 - 0.5 K/uL   Basophils Relative 0 %   Basophils Absolute 0.0 0.0 - 0.1 K/uL   Immature Granulocytes 1 %   Abs Immature Granulocytes 0.09 (H) 0.00 - 0.07 K/uL  Uric acid     Status: None   Collection Time: 06/18/19  6:34 PM  Result Value Ref Range   Uric Acid, Serum 3.1 2.5 - 7.1 mg/dL  Urinalysis, Complete w Microscopic     Status: Abnormal   Collection Time: 06/18/19  6:35 PM  Result Value Ref Range   Color,  Urine YELLOW (A) YELLOW   APPearance CLOUDY (A) CLEAR   Specific Gravity, Urine 1.017 1.005 - 1.030   pH 6.0 5.0 - 8.0   Glucose, UA NEGATIVE NEGATIVE mg/dL   Hgb urine dipstick LARGE (A) NEGATIVE   Bilirubin Urine NEGATIVE NEGATIVE   Ketones, ur 5 (A) NEGATIVE mg/dL   Protein, ur 100 (A) NEGATIVE mg/dL   Nitrite NEGATIVE NEGATIVE   Leukocytes,Ua MODERATE (A) NEGATIVE   RBC / HPF >50 (H) 0 - 5 RBC/hpf   WBC, UA >50 (H) 0 - 5 WBC/hpf   Bacteria, UA RARE (A) NONE SEEN   Squamous Epithelial / LPF 0-5 0 - 5   Mucus PRESENT   Culture, blood (routine x 2)     Status: None (Preliminary result)   Collection Time: 06/18/19  6:35 PM   Specimen: BLOOD  Result Value Ref Range   Specimen Description BLOOD RIGHT ANTECUBITAL    Special Requests      BOTTLES DRAWN AEROBIC AND ANAEROBIC Blood Culture adequate volume   Culture      NO GROWTH < 12 HOURS Performed at [facility], [address] [place], [place] [address]    Report Status PENDING   Culture, blood (routine x 2)     Status: None (Preliminary result)   Collection Time: 06/18/19  6:35 PM   Specimen: BLOOD  Result Value Ref Range   Specimen Description BLOOD BLOOD LEFT WRIST    Special Requests      BOTTLES DRAWN AEROBIC AND ANAEROBIC Blood Culture adequate volume   Culture      NO GROWTH < 12 HOURS Performed at [facility], [address] [place], [place] [address]    Report Status PENDING   Protein / creatinine ratio, urine     Status: Abnormal   Collection Time: 06/18/19  6:35 PM  Result Value Ref Range   Creatinine, Urine 178 mg/dL   Total Protein, Urine 88 mg/dL  Protein Creatinine Ratio 0.49 (H) 0.00 - 0.15 mg/mg[Cre]  SARS Coronavirus 2 [facility] order, Performed in [facility] hospital lab) Nasopharyngeal Nasopharyngeal Swab     Status: None   Collection Time: 06/18/19  7:18 PM   Specimen: Nasopharyngeal Swab  Result Value Ref Range   SARS Coronavirus 2 NEGATIVE NEGATIVE  Lactic acid, plasma      Status: None   Collection Time: 06/18/19  8:58 PM  Result Value Ref Range   Lactic Acid, Venous 0.8 0.5 - 1.9 mmol/L  Type and screen Cataract     Status: None   Collection Time: 06/18/19  9:59 PM  Result Value Ref Range   ABO/RH(D) O POS    Antibody Screen NEG    Sample Expiration      06/21/2019,2359 Performed at [facility], [address] [place], [place] [address]   CBC on admission     Status: Abnormal   Collection Time: 06/19/19  4:45 AM  Result Value Ref Range   WBC 7.3 4.0 - 10.5 K/uL   RBC 2.53 (L) 3.87 - 5.11 MIL/uL   Hemoglobin 7.1 (L) 12.0 - 15.0 g/dL   HCT 22.3 (L) 36.0 - 46.0 %   MCV 88.1 80.0 - 100.0 fL   MCH 28.1 26.0 - 34.0 pg   MCHC 31.8 30.0 - 36.0 g/dL   RDW 13.3 11.5 - 15.5 %   Platelets 142 (L) 150 - 400 K/uL   nRBC 0.0 0.0 - 0.2 %  Comprehensive metabolic panel     Status: Abnormal   Collection Time: 06/19/19  4:45 AM  Result Value Ref Range   Sodium 135 135 - 145 mmol/L   Potassium 3.7 3.5 - 5.1 mmol/L   Chloride 109 98 - 111 mmol/L   CO2 20 (L) 22 - 32 mmol/L   Glucose, Bld 115 (H) 70 - 99 mg/dL   BUN 7 6 - 20 mg/dL   Creatinine, Ser 0.49 0.44 - 1.00 mg/dL   Calcium 7.8 (L) 8.9 - 10.3 mg/dL   Total Protein 5.0 (L) 6.5 - 8.1 g/dL   Albumin 2.0 (L) 3.5 - 5.0 g/dL   AST 28 15 - 41 U/L   ALT 15 0 - 44 U/L   Alkaline Phosphatase 122 38 - 126 U/L   Total Bilirubin 0.5 0.3 - 1.2 mg/dL   GFR calc non Af Amer >60 >60 mL/min   GFR calc Af Amer >60 >60 mL/min   Anion gap 6 5 - 15    Cultures: Results for orders placed or performed during the hospital encounter of 06/18/19  Culture, blood (routine x 2)     Status: None (Preliminary result)   Collection Time: 06/18/19  6:35 PM   Specimen: BLOOD  Result Value Ref Range Status   Specimen Description BLOOD RIGHT ANTECUBITAL  Final   Special Requests   Final    BOTTLES DRAWN AEROBIC AND ANAEROBIC Blood Culture adequate volume   Culture   Final    NO GROWTH < 12  HOURS Performed at [facility], [address] [place],  [address]    Report Status PENDING  Incomplete  Culture, blood (routine x 2)     Status: None (Preliminary result)   Collection Time: 06/18/19  6:35 PM   Specimen: BLOOD  Result Value Ref Range Status   Specimen Description BLOOD BLOOD LEFT WRIST  Final   Special Requests   Final    BOTTLES DRAWN AEROBIC AND ANAEROBIC Blood Culture  adequate volume   Culture   Final    NO GROWTH < 12 HOURS Performed at [facility], [address] [place], [place] [address]    Report Status PENDING  Incomplete  SARS Coronavirus 2 [facility] order, Performed in [facility] hospital lab) Nasopharyngeal Nasopharyngeal Swab     Status: None   Collection Time: 06/18/19  7:18 PM   Specimen: Nasopharyngeal Swab  Result Value Ref Range Status   SARS Coronavirus 2 NEGATIVE NEGATIVE Final    Comment: (NOTE) If result is NEGATIVE SARS-CoV-2 target nucleic acids are NOT DETECTED. The SARS-CoV-2 RNA is generally detectable in upper and lower  respiratory specimens during the acute phase of infection. The lowest  concentration of SARS-CoV-2 viral copies this assay can detect is 250  copies / mL. A negative result does not preclude SARS-CoV-2 infection  and should not be used as the sole basis for treatment or other  patient management decisions.  A negative result may occur with  improper specimen collection / handling, submission of specimen other  than nasopharyngeal swab, presence of viral mutation(s) within the  areas targeted by this assay, and inadequate number of viral copies  (<250 copies / mL). A negative result must be combined with clinical  observations, patient history, and epidemiological information. If result is POSITIVE SARS-CoV-2 target nucleic acids are DETECTED. The SARS-CoV-2 RNA is generally detectable in upper and lower  respiratory specimens dur ing the acute phase of infection.  Positive  results  are indicative of active infection with SARS-CoV-2.  Clinical  correlation with patient history and other diagnostic information is  necessary to determine patient infection status.  Positive results do  not rule out bacterial infection or co-infection with other viruses. If result is PRESUMPTIVE POSTIVE SARS-CoV-2 nucleic acids MAY BE PRESENT.   A presumptive positive result was obtained on the submitted specimen  and confirmed on repeat testing.  While 2019 novel coronavirus  (SARS-CoV-2) nucleic acids may be present in the submitted sample  additional confirmatory testing may be necessary for epidemiological  and / or clinical management purposes  to differentiate between  SARS-CoV-2 and other Sarbecovirus currently known to infect humans.  If clinically indicated additional testing with an alternate test  methodology [phone]) is advised. The SARS-CoV-2 RNA is generally  detectable in upper and lower respiratory sp ecimens during the acute  phase of infection. The expected result is Negative. Fact Sheet for Patients:  StrictlyIdeas.no Fact Sheet for Healthcare Providers: BankingDealers.co.za This test is not yet approved or cleared by the [place] FDA and has been authorized for detection and/or diagnosis of SARS-CoV-2 by FDA under an Emergency Use Authorization (EUA).  This EUA will remain in effect (meaning this test can be used) for the duration of the COVID-19 declaration under Section 564(b)(1) of the Act, 21 U.S.C. section 360bbb-3(b)(1), unless the authorization is terminated or revoked sooner. Performed at [facility], [address] [place], [place] [address]     Imaging: Dg Chest [place] 1 View  Result Date: 06/18/2019 CLINICAL DATA:  Fever today. Recent postpartum post vaginal delivery. EXAM: PORTABLE CHEST 1 VIEW COMPARISON:  None. FINDINGS: The cardiomediastinal contours are normal. Scattered subsegmental  atelectasis. No confluent airspace disease. Pulmonary vasculature is normal. No large pleural effusion or pneumothorax. No acute osseous abnormalities are seen. IMPRESSION: Scattered subsegmental atelectasis. Electronically Signed   By: Keith Rake M.D.   On: 06/18/2019 18:59     Assessment   22 y.o. [place] Hospital Day: 2  Plan   1. Fever: -IV antibiotics ordered -UTI concerning for infection, urine culture pending -Blood cultures with no growth in 12 hours -Plan hospitalist consult for management guidance  2. Anemia: -s/p acute blood loss postpartum  -plan for 1 unit PRBCs -ferrous sulfate PO BID ordered  3. S/p SVD, breastfeeding: -lactation consult PRN for pumping support -lochia WNL  Plan to discuss with Dr. Leafy Ro, back-up OB/GYN, for further assessment and management.    Lisette Grinder, [place] 06/19/2019 6:19 PM

## 2019-06-20 ENCOUNTER — Inpatient Hospital Stay (HOSPITAL_COMMUNITY)
Admit: 2019-06-20 | Discharge: 2019-06-20 | Disposition: A | Discharge status: Home / Self Care | Payer: 59 | Attending: Pulmonary Disease | Admitting: Pulmonary Disease

## 2019-06-20 ENCOUNTER — Encounter: Payer: Self-pay | Admitting: Lactation Services

## 2019-06-20 ENCOUNTER — Inpatient Hospital Stay: Payer: 59

## 2019-06-20 DIAGNOSIS — I361 Nonrheumatic tricuspid (valve) insufficiency: Secondary | ICD-10-CM

## 2019-06-20 DIAGNOSIS — I340 Nonrheumatic mitral (valve) insufficiency: Secondary | ICD-10-CM

## 2019-06-20 LAB — TYPE AND SCREEN
ABO/RH(D): O POS
Antibody Screen: NEGATIVE
Unit division: 0

## 2019-06-20 LAB — CBC
HCT: 24.6 % — ABNORMAL LOW (ref 36.0–46.0)
Hemoglobin: 7.9 g/dL — ABNORMAL LOW (ref 12.0–15.0)
MCH: 28.3 pg (ref 26.0–34.0)
MCHC: 32.1 g/dL (ref 30.0–36.0)
MCV: 88.2 fL (ref 80.0–100.0)
Platelets: 133 10*3/uL — ABNORMAL LOW (ref 150–400)
RBC: 2.79 MIL/uL — ABNORMAL LOW (ref 3.87–5.11)
RDW: 14 % (ref 11.5–15.5)
WBC: 8.1 10*3/uL (ref 4.0–10.5)
nRBC: 0 % (ref 0.0–0.2)

## 2019-06-20 LAB — BPAM RBC
Blood Product Expiration Date: 202009072359
ISSUE DATE / TIME: 202008141045
Unit Type and Rh: 5100

## 2019-06-20 LAB — COMPREHENSIVE METABOLIC PANEL
ALT: 25 U/L (ref 0–44)
AST: 37 U/L (ref 15–41)
Albumin: 2 g/dL — ABNORMAL LOW (ref 3.5–5.0)
Alkaline Phosphatase: 104 U/L (ref 38–126)
Anion gap: 7 (ref 5–15)
BUN: 10 mg/dL (ref 6–20)
CO2: 21 mmol/L — ABNORMAL LOW (ref 22–32)
Calcium: 8.2 mg/dL — ABNORMAL LOW (ref 8.9–10.3)
Chloride: 108 mmol/L (ref 98–111)
Creatinine, Ser: 0.48 mg/dL (ref 0.44–1.00)
GFR calc Af Amer: >60 mL/min (ref >60)
GFR calc non Af Amer: >60 mL/min (ref >60)
Glucose, Bld: 91 mg/dL (ref 70–99)
Potassium: 4 mmol/L (ref 3.5–5.1)
Sodium: 136 mmol/L (ref 135–145)
Total Bilirubin: 0.4 mg/dL (ref 0.3–1.2)
Total Protein: 5 g/dL — ABNORMAL LOW (ref 6.5–8.1)

## 2019-06-20 LAB — PROCALCITONIN: Procalcitonin: 36.56 ng/mL

## 2019-06-20 LAB — ECHOCARDIOGRAM COMPLETE
Height: 63 in
Weight: 2705.49 oz

## 2019-06-20 LAB — URINE CULTURE: Culture: <10000 — AB

## 2019-06-20 MED ORDER — FAMOTIDINE 20 MG PO TABS: 20.0000 mg | ORAL_TABLET | Freq: Two times a day (BID) | ORAL | Status: DC

## 2019-06-20 MED ORDER — ENOXAPARIN SODIUM 80 MG/0.8ML ~~LOC~~ SOLN
1.0000 mg/kg | Freq: Two times a day (BID) | SUBCUTANEOUS | Status: DC
  Administered 2019-06-20 – 2019-06-21 (×2): 75 mg via SUBCUTANEOUS
  Filled 2019-06-20 (×3): qty 0.8

## 2019-06-20 NOTE — Progress Notes (Addendum)
Telephone report called to [facility].

## 2019-06-20 NOTE — Progress Notes (Signed)
Late entry  Obstetric and Gynecology  HD # 3  Subjective   Patient feels about the same as admission.   Right flank/back pain but more central/midline back now.  Noted some swelling at epidural site.  Still having spikes of fever with shaking chills and when this happens she feels terrible.  Between these episodes she feels ok.  Pumping breastmilk. Minimal lochia.  -seen by anesthesia who did not think the swelling on her back was anything to worry about.  No note in chart.  -seen by cardiology, EF 55% with non-clinical mitral valve regurgitation.   -continued on meropenem in spite of negative urine and blood cultures -continues to have fever spikes, Tmax 103@ 1415. -sustained tachycardia -s/p 2u PRBC for symptomatic anemia -s/p manual evacuation of uterine clots  -s/p CT CAP with no PE, and concern for pyelonephritis (right), and non-obstructing kidney stone.  Patient was supposed to see urology as outpatient after delivery for stent placement due to stone.     Objective  Objective:   Vitals:   06/20/19 1630 06/20/19 1700 06/20/19 1800 06/20/19 2000  BP:   97/63   Pulse:  (!) 115    Resp:  16 (!) 21   Temp: 99.2 F (37.3 C)   98.2 F (36.8 C)  TempSrc:    Oral  SpO2:  98%    Weight:      Height:        Intake/Output Summary (Last 24 hours) at 06/20/2019 2220 Last data filed at 06/20/2019 1700 Gross per 24 hour  Intake 970 ml  Output 500 ml  Net 470 ml    General: NAD Cardiovascular: tachycardic, reg rate, no murmurs Pulmonary: CTAB, normal respiratory effort Abdomen: Benign. Non-tender, +BS, no guarding.  Extremities: No erythema or cords, no calf tenderness, with normal peripheral pulses. Back; 10cm diameter area of swelling mid-back, midline at epidural site.  Non-tender, marked.  Kernig, Brudzinski signs negative.  Neg neck stiffness.   Labs: Results for orders placed or performed during the hospital encounter of 06/18/19 (from the past 24 hour(s))   Procalcitonin     Status: None   Collection Time: 06/20/19  5:09 AM  Result Value Ref Range   Procalcitonin 36.56 ng/mL  CBC     Status: Abnormal   Collection Time: 06/20/19  5:09 AM  Result Value Ref Range   WBC 8.1 4.0 - 10.5 K/uL   RBC 2.79 (L) 3.87 - 5.11 MIL/uL   Hemoglobin 7.9 (L) 12.0 - 15.0 g/dL   HCT 24.6 (L) 36.0 - 46.0 %   MCV 88.2 80.0 - 100.0 fL   MCH 28.3 26.0 - 34.0 pg   MCHC 32.1 30.0 - 36.0 g/dL   RDW 14.0 11.5 - 15.5 %   Platelets 133 (L) 150 - 400 K/uL   nRBC 0.0 0.0 - 0.2 %  Comprehensive metabolic panel     Status: Abnormal   Collection Time: 06/20/19  5:09 AM  Result Value Ref Range   Sodium 136 135 - 145 mmol/L   Potassium 4.0 3.5 - 5.1 mmol/L   Chloride 108 98 - 111 mmol/L   CO2 21 (L) 22 - 32 mmol/L   Glucose, Bld 91 70 - 99 mg/dL   BUN 10 6 - 20 mg/dL   Creatinine, Ser 0.48 0.44 - 1.00 mg/dL   Calcium 8.2 (L) 8.9 - 10.3 mg/dL   Total Protein 5.0 (L) 6.5 - 8.1 g/dL   Albumin 2.0 (L) 3.5 - 5.0 g/dL   AST  37 15 - 41 U/L   ALT 25 0 - 44 U/L   Alkaline Phosphatase 104 38 - 126 U/L   Total Bilirubin 0.4 0.3 - 1.2 mg/dL   GFR calc non Af Amer >60 >60 mL/min   GFR calc Af Amer >60 >60 mL/min   Anion gap 7 5 - 15    Cultures: Results for orders placed or performed during the hospital encounter of 06/18/19  Culture, blood (routine x 2)     Status: None (Preliminary result)   Collection Time: 06/18/19  6:35 PM   Specimen: BLOOD  Result Value Ref Range Status   Specimen Description BLOOD RIGHT ANTECUBITAL  Final   Special Requests   Final    BOTTLES DRAWN AEROBIC AND ANAEROBIC Blood Culture adequate volume   Culture   Final    NO GROWTH 2 DAYS Performed at [facility], [address], [place], [place] [address]    Report Status PENDING  Incomplete  Culture, blood (routine x 2)     Status: None (Preliminary result)   Collection Time: 06/18/19  6:35 PM   Specimen: BLOOD  Result Value Ref Range Status   Specimen Description BLOOD BLOOD  LEFT WRIST  Final   Special Requests   Final    BOTTLES DRAWN AEROBIC AND ANAEROBIC Blood Culture adequate volume   Culture   Final    NO GROWTH 2 DAYS Performed at [facility], [address], [place], [place] [address]    Report Status PENDING  Incomplete  Urine Culture     Status: Abnormal   Collection Time: 06/18/19  6:35 PM   Specimen: Urine, Random  Result Value Ref Range Status   Specimen Description   Final    URINE, RANDOM Performed at [facility], [address], [place], [place] [address]    Special Requests   Final    NONE Performed at [facility], [address], [place], [place] [address]    Culture (A)  Final    <10,000 COLONIES/mL INSIGNIFICANT GROWTH Performed at [facility], [place] [address] [place], [place] [address]    Report Status 06/20/2019 FINAL  Final  SARS Coronavirus 2 [facility] order, Performed in [facility] hospital lab) Nasopharyngeal Nasopharyngeal Swab     Status: None   Collection Time: 06/18/19  7:18 PM   Specimen: Nasopharyngeal Swab  Result Value Ref Range Status   SARS Coronavirus 2 NEGATIVE NEGATIVE Final    Comment: (NOTE) If result is NEGATIVE SARS-CoV-2 target nucleic acids are NOT DETECTED. The SARS-CoV-2 RNA is generally detectable in upper and lower  respiratory specimens during the acute phase of infection. The lowest  concentration of SARS-CoV-2 viral copies this assay can detect is 250  copies / mL. A negative result does not preclude SARS-CoV-2 infection  and should not be used as the sole basis for treatment or other  patient management decisions.  A negative result may occur with  improper specimen collection / handling, submission of specimen other  than nasopharyngeal swab, presence of viral mutation(s) within the  areas targeted by this assay, and inadequate number of viral copies  (<250 copies / mL). A negative result must be combined with clinical  observations, patient history,  and epidemiological information. If result is POSITIVE SARS-CoV-2 target nucleic acids are DETECTED. The SARS-CoV-2 RNA is generally detectable in upper and lower  respiratory specimens dur ing the acute phase of infection.  Positive  results are indicative of active infection  with SARS-CoV-2.  Clinical  correlation with patient history and other diagnostic information is  necessary to determine patient infection status.  Positive results do  not rule out bacterial infection or co-infection with other viruses. If result is PRESUMPTIVE POSTIVE SARS-CoV-2 nucleic acids MAY BE PRESENT.   A presumptive positive result was obtained on the submitted specimen  and confirmed on repeat testing.  While 2019 novel coronavirus  (SARS-CoV-2) nucleic acids may be present in the submitted sample  additional confirmatory testing may be necessary for epidemiological  and / or clinical management purposes  to differentiate between  SARS-CoV-2 and other Sarbecovirus currently known to infect humans.  If clinically indicated additional testing with an alternate test  methodology [phone]) is advised. The SARS-CoV-2 RNA is generally  detectable in upper and lower respiratory sp ecimens during the acute  phase of infection. The expected result is Negative. Fact Sheet for Patients:  StrictlyIdeas.no Fact Sheet for Healthcare Providers: BankingDealers.co.za This test is not yet approved or cleared by the [place] FDA and has been authorized for detection and/or diagnosis of SARS-CoV-2 by FDA under an Emergency Use Authorization (EUA).  This EUA will remain in effect (meaning this test can be used) for the duration of the COVID-19 declaration under Section 564(b)(1) of the Act, 21 U.S.C. section 360bbb-3(b)(1), unless the authorization is terminated or revoked sooner. Performed at [facility], [address] [place], [place] [address]      Imaging: Ct Angio Chest Pe W Or Wo Contrast  Result Date: 06/19/2019 CLINICAL DATA:  Chest tightness and fever. EXAM: CT ANGIOGRAPHY CHEST WITH CONTRAST TECHNIQUE: Multidetector CT imaging of the chest was performed using the standard protocol during bolus administration of intravenous contrast. Multiplanar CT image reconstructions and MIPs were obtained to evaluate the vascular anatomy. CONTRAST:  36mL OMNIPAQUE IOHEXOL 350 MG/ML SOLN COMPARISON:  None. FINDINGS: Cardiovascular: Some of the central and peripheral pulmonary artery branches are difficult to characterize due to mild patient motion artifact and blood flow artifact, however, there is no convincing pulmonary embolism identified within the main, lobar or segmental pulmonary arteries bilaterally. No occlusive pulmonary embolus identified. No thoracic aortic aneurysm or evidence of aortic dissection. Heart size is upper normal. No pericardial effusion. Mediastinum/Nodes: No mass or enlarged lymph nodes seen within the mediastinum or perihilar regions. Esophagus appears normal. Trachea and central bronchi are unremarkable. Lungs/Pleura: Mixed nodular and ground-glass consolidation within the RIGHT lower lobe and along the posterior margin of the RIGHT upper lobe. Some component may represent atelectasis. Small bilateral pleural effusions w, RIGHT slightly greater than LEFT. Upper Abdomen: Limited images of the upper abdomen are unremarkable. Musculoskeletal: No acute or suspicious osseous finding. Review of the MIP images confirms the above findings. IMPRESSION: 1. No pulmonary embolus seen, with mild study limitations detailed above. 2. Mixed nodular and ground-glass consolidations within the RIGHT lower lobe and along the posterior margin of the RIGHT upper lobe. Some component may represent atelectasis. Differential includes atypical pneumonias such as viral or fungal, interstitial pneumonias, edema related to volume overload/CHF,  hypersensitivity pneumonitis, and respiratory bronchiolitis. 3. Small bilateral pleural effusions, RIGHT slightly greater than LEFT. 4. Heart size is upper normal. No pericardial effusion. Electronically Signed   By: Franki Cabot M.D.   On: 06/19/2019 16:18   Ct Abdomen Pelvis W Contrast  Result Date: 06/19/2019 CLINICAL DATA:  Chest tightness and fever. Recent vaginal delivery. Low back pain and headache. EXAM: CT ABDOMEN AND PELVIS WITH CONTRAST TECHNIQUE: Multidetector CT imaging of the  abdomen and pelvis was performed using the standard protocol following bolus administration of intravenous contrast. CONTRAST:  74mL OMNIPAQUE IOHEXOL 350 MG/ML SOLN COMPARISON:  None. FINDINGS: Lower chest: Small bilateral pleural effusions, with associated mild atelectasis. Hepatobiliary: No focal liver abnormality is seen. No gallstones, gallbladder wall thickening, or biliary dilatation. Pancreas: Unremarkable. No pancreatic ductal dilatation or surrounding inflammatory changes. Spleen: Normal in size without focal abnormality. Adrenals/Urinary Tract: Adrenal glands appear normal. Ill-defined edema throughout of the majority of the RIGHT renal cortex, indicating pyelonephritis. Associated perinephric fluid/inflammation. Questionable additional edema at the upper pole of the LEFT kidney, but not definitive. Mild bilateral hydronephrosis ww, likely physiologic related to the recently gravid uterus. 1 mm RIGHT renal stone Stomach/Bowel: z fairly large amount of stool within the rectosigmoid colon. Or proximal segments of the large bowel are normal in caliber. No small bowel dilatation. Stomach is unremarkable, partially decompressed. Appendix is normal. Vascular/Lymphatic: No significant vascular findings are present. No enlarged abdominal or pelvic lymph nodes. Reproductive: Enlarged uterus, compatible with the given history of recent pregnancy. Other: Small amount of free fluid in the pelvis. No abscess collection  identified. No free intraperitoneal air. Musculoskeletal: No acute or suspicious osseous finding. IMPRESSION: 1. Pyelonephritis of the RIGHT kidney, of a fairly severe degree, with edema throughout the RIGHT renal cortex and prominent perinephric fluid/inflammation. Questionable additional pyelonephritis at the upper pole of the LEFT kidney, but not convincing. 2. Mild bilateral hydronephrosis, likely physiologic related to the recently gravid uterus. 3. 1 mm RIGHT renal stone. 4. Fairly large amount of stool within the rectosigmoid colon (constipation? ). 5. Small bilateral pleural effusions, with associated mild atelectasis. 6. No abscess collection identified. However, would consider follow-up CT abdomen within 1 week to exclude early developing abscess within the RIGHT renal cortex. Electronically Signed   By: Franki Cabot M.D.   On: 06/19/2019 16:42   Dg Chest Port 1 View  Result Date: 06/20/2019 CLINICAL DATA:  Respiratory failure. EXAM: PORTABLE CHEST 1 VIEW COMPARISON:  06/19/2019 FINDINGS: Normal heart size. No pleural effusion or edema identified. No airspace consolidation identified. No atelectasis. The visualized osseous structures are unremarkable. IMPRESSION: 1. No acute cardiopulmonary abnormality. Electronically Signed   By: Kerby Moors M.D.   On: 06/20/2019 08:36   Dg Chest Port 1 View  Result Date: 06/18/2019 CLINICAL DATA:  Fever today. Recent postpartum post vaginal delivery. EXAM: PORTABLE CHEST 1 VIEW COMPARISON:  None. FINDINGS: The cardiomediastinal contours are normal. Scattered subsegmental atelectasis. No confluent airspace disease. Pulmonary vasculature is normal. No large pleural effusion or pneumothorax. No acute osseous abnormalities are seen. IMPRESSION: Scattered subsegmental atelectasis. Electronically Signed   By: Keith Rake M.D.   On: 06/18/2019 18:59     Assessment   22 y.o. [place] [facility] Day: 3   Plan   1. Postpartum:  -lochia is normal an blood  loss is as expected.  She should have no more significat losses after evacuation of blood clots.    -continue lactation support; pump ad lib,   -fundus below umbilicus  2. Fever:  -negative cultures both urine and blood  -spiking in spite of antibiotics  -likely septic thrombophlebitis, which is usually seen in setting of pelvic surgery, but can present after vaginal delivery. Information printed and given to CCU NP.  Treatment is anti-coagulation  -Start LOVENOX 1mg /kg BID   3. Kidney stone:  -still present, and was advised to see urology post-delivery for stent.  -after transfer out of ICU, consider in-patient consult and stent placement  during current admission.  -negative urine cultures - perhaps CT findings are more indicative of chronic inflammation  4. ICU admission - if remains stable, can be transferred back to 3rd floor [facility], under my service.  5. Continue reg diet, IV fluids with Na, K, Ca, Mg, Phos repletion PRN, OOB as often as possible  ----- Larey Days, MD, FACOG Attending Obstetrician and Gynecologist [facility], Department of OB/GYN [facility]  ;

## 2019-06-20 NOTE — Progress Notes (Signed)
Patient febrile 102.9 oral.  PO ibuprofen administered per PRN order.

## 2019-06-20 NOTE — Progress Notes (Signed)
13:05 patient being updated on new room assignment and verbalized that she wasn't feeling good.  Patient started with chills and shaking, heart rate increased to 120's.  Dr. Alva Garnet notified.  13:30 patient with sustained tachycardia 130's, chills, and increased mid back pain.  13:35 12 lead EKG obtained.  13:46 Dr. Alva Garnet at bedside evaluating patient.  Patient continues to be tachy with heart rate 130's.  Oral temperature elevated to 99.2.  Scheduled PO tylenol administered.  Patient verbalized that she felt the same symptoms yesterday when she was transferred to ICU.  Transfer order discontinued.  Patient to stay in ICU as stepdown patient. Cardiologist consult obtained.  Nurse will continue to monitor.

## 2019-06-20 NOTE — Progress Notes (Signed)
[facility] NAME: Patricia Pearson    MR#:  [id number]  DATE OF BIRTH:  [birth year]  SUBJECTIVE:   patient much better ths am   REVIEW OF SYSTEMS:    Review of Systems  Constitutional: Negative for fever, chills weight loss HENT: Negative for ear pain, nosebleeds, congestion, facial swelling, rhinorrhea, neck pain, neck stiffness and ear discharge.   Respiratory: Negative for cough, shortness of breath, wheezing  Cardiovascular: Negative for chest pain, palpitations and leg swelling.  Gastrointestinal: Negative for heartburn, abdominal pain, vomiting, diarrhea or consitpation Genitourinary:+dysuria, no urgency, frequency, hematuria Musculoskeletal: Negative for back pain or joint pain Neurological: Negative for dizziness, seizures, syncope, focal weakness,  numbness and headaches.  Hematological: Does not bruise/bleed easily.  Psychiatric/Behavioral: Negative for hallucinations, confusion, dysphoric mood    Tolerating Diet: yes      DRUG ALLERGIES:  No Known Allergies  VITALS:  Blood pressure 105/72, pulse 94, temperature 98.2 F (36.8 C), temperature source Oral, resp. rate (!) 24, height 5\' 3"  (1.6 m), weight 76.7 kg, SpO2 100 %, unknown if currently breastfeeding.  PHYSICAL EXAMINATION:  Constitutional: Appears well-developed and well-nourished. No distress. HENT: Normocephalic. Marland Kitchen Oropharynx is clear and moist.  Eyes: Conjunctivae and EOM are normal. PERRLA, no scleral icterus.  Neck: Normal ROM. Neck supple. No JVD. No tracheal deviation. CVS: RRR, S1/S2 +, no murmurs, no gallops, no carotid bruit.  Pulmonary: Effort and breath sounds normal, no stridor, rhonchi, wheezes, rales.  Abdominal: Soft. BS +,  no distension, tenderness, rebound or guarding.  Musculoskeletal: Normal range of motion. No edema and no tenderness.  Neuro: Alert. CN 2-12 grossly intact. No focal deficits. Skin: Skin is warm and dry. No rash  noted. Psychiatric: Normal mood and affect.      LABORATORY PANEL:   CBC Recent Labs  Lab 06/20/19 0509  WBC 8.1  HGB 7.9*  HCT 24.6*  PLT 133*   ------------------------------------------------------------------------------------------------------------------  Chemistries  Recent Labs  Lab 06/19/19 2110 06/20/19 0509  NA 136 136  K 3.3* 4.0  CL 108 108  CO2 21* 21*  GLUCOSE 114* 91  BUN 8 10  CREATININE 0.51 0.48  CALCIUM 7.9* 8.2*  MG 1.4*  --   AST  --  37  ALT  --  25  ALKPHOS  --  104  BILITOT  --  0.4   ------------------------------------------------------------------------------------------------------------------  Cardiac Enzymes No results for input(s): TROPONINI in the last 168 hours. ------------------------------------------------------------------------------------------------------------------  RADIOLOGY:  Ct Angio Chest Pe W Or Wo Contrast  Result Date: 06/19/2019 CLINICAL DATA:  Chest tightness and fever. EXAM: CT ANGIOGRAPHY CHEST WITH CONTRAST TECHNIQUE: Multidetector CT imaging of the chest was performed using the standard protocol during bolus administration of intravenous contrast. Multiplanar CT image reconstructions and MIPs were obtained to evaluate the vascular anatomy. CONTRAST:  2mL OMNIPAQUE IOHEXOL 350 MG/ML SOLN COMPARISON:  None. FINDINGS: Cardiovascular: Some of the central and peripheral pulmonary artery branches are difficult to characterize due to mild patient motion artifact and blood flow artifact, however, there is no convincing pulmonary embolism identified within the main, lobar or segmental pulmonary arteries bilaterally. No occlusive pulmonary embolus identified. No thoracic aortic aneurysm or evidence of aortic dissection. Heart size is upper normal. No pericardial effusion. Mediastinum/Nodes: No mass or enlarged lymph nodes seen within the mediastinum or perihilar regions. Esophagus appears normal. Trachea and central  bronchi are unremarkable. Lungs/Pleura: Mixed nodular and ground-glass consolidation within the RIGHT lower lobe and along the posterior  margin of the RIGHT upper lobe. Some component may represent atelectasis. Small bilateral pleural effusions w, RIGHT slightly greater than LEFT. Upper Abdomen: Limited images of the upper abdomen are unremarkable. Musculoskeletal: No acute or suspicious osseous finding. Review of the MIP images confirms the above findings. IMPRESSION: 1. No pulmonary embolus seen, with mild study limitations detailed above. 2. Mixed nodular and ground-glass consolidations within the RIGHT lower lobe and along the posterior margin of the RIGHT upper lobe. Some component may represent atelectasis. Differential includes atypical pneumonias such as viral or fungal, interstitial pneumonias, edema related to volume overload/CHF, hypersensitivity pneumonitis, and respiratory bronchiolitis. 3. Small bilateral pleural effusions, RIGHT slightly greater than LEFT. 4. Heart size is upper normal. No pericardial effusion. Electronically Signed   By: Franki Cabot M.D.   On: 06/19/2019 16:18   Ct Abdomen Pelvis W Contrast  Result Date: 06/19/2019 CLINICAL DATA:  Chest tightness and fever. Recent vaginal delivery. Low back pain and headache. EXAM: CT ABDOMEN AND PELVIS WITH CONTRAST TECHNIQUE: Multidetector CT imaging of the abdomen and pelvis was performed using the standard protocol following bolus administration of intravenous contrast. CONTRAST:  87mL OMNIPAQUE IOHEXOL 350 MG/ML SOLN COMPARISON:  None. FINDINGS: Lower chest: Small bilateral pleural effusions, with associated mild atelectasis. Hepatobiliary: No focal liver abnormality is seen. No gallstones, gallbladder wall thickening, or biliary dilatation. Pancreas: Unremarkable. No pancreatic ductal dilatation or surrounding inflammatory changes. Spleen: Normal in size without focal abnormality. Adrenals/Urinary Tract: Adrenal glands appear normal.  Ill-defined edema throughout of the majority of the RIGHT renal cortex, indicating pyelonephritis. Associated perinephric fluid/inflammation. Questionable additional edema at the upper pole of the LEFT kidney, but not definitive. Mild bilateral hydronephrosis ww, likely physiologic related to the recently gravid uterus. 1 mm RIGHT renal stone Stomach/Bowel: z fairly large amount of stool within the rectosigmoid colon. Or proximal segments of the large bowel are normal in caliber. No small bowel dilatation. Stomach is unremarkable, partially decompressed. Appendix is normal. Vascular/Lymphatic: No significant vascular findings are present. No enlarged abdominal or pelvic lymph nodes. Reproductive: Enlarged uterus, compatible with the given history of recent pregnancy. Other: Small amount of free fluid in the pelvis. No abscess collection identified. No free intraperitoneal air. Musculoskeletal: No acute or suspicious osseous finding. IMPRESSION: 1. Pyelonephritis of the RIGHT kidney, of a fairly severe degree, with edema throughout the RIGHT renal cortex and prominent perinephric fluid/inflammation. Questionable additional pyelonephritis at the upper pole of the LEFT kidney, but not convincing. 2. Mild bilateral hydronephrosis, likely physiologic related to the recently gravid uterus. 3. 1 mm RIGHT renal stone. 4. Fairly large amount of stool within the rectosigmoid colon (constipation? ). 5. Small bilateral pleural effusions, with associated mild atelectasis. 6. No abscess collection identified. However, would consider follow-up CT abdomen within 1 week to exclude early developing abscess within the RIGHT renal cortex. Electronically Signed   By: Franki Cabot M.D.   On: 06/19/2019 16:42   Dg Chest Port 1 View  Result Date: 06/20/2019 CLINICAL DATA:  Respiratory failure. EXAM: PORTABLE CHEST 1 VIEW COMPARISON:  06/19/2019 FINDINGS: Normal heart size. No pleural effusion or edema identified. No airspace  consolidation identified. No atelectasis. The visualized osseous structures are unremarkable. IMPRESSION: 1. No acute cardiopulmonary abnormality. Electronically Signed   By: Kerby Moors M.D.   On: 06/20/2019 08:36   Dg Chest Port 1 View  Result Date: 06/18/2019 CLINICAL DATA:  Fever today. Recent postpartum post vaginal delivery. EXAM: PORTABLE CHEST 1 VIEW COMPARISON:  None. FINDINGS: The cardiomediastinal contours are normal.  Scattered subsegmental atelectasis. No confluent airspace disease. Pulmonary vasculature is normal. No large pleural effusion or pneumothorax. No acute osseous abnormalities are seen. IMPRESSION: Scattered subsegmental atelectasis. Electronically Signed   By: Keith Rake M.D.   On: 06/18/2019 18:59     ASSESSMENT AND PLAN:   23 year old female gravida 1 para 1 status post vaginal delivery on August 11 requiring induction for preeclampsia who subsequently developed tachycardia and acute blood loss anemia.   1.  Sepsis: With tachycardia and fever due to severe Pyelonephritis of the RIGHT kidneywith edema throughout the RIGHT renal cortex and prominent perinephric fluid/inflammation. Continue broad-spectrum antibiotics and follow-up on final urine culture. Sepsis is resolving.   2.  Acute blood loss anemia status post vaginal delivery on August 11: Status post 2 unit PRBC Hemoglobin 7.9 Transfuse if less than 7  Discussed with Dr. Alva Garnet Management plans discussed with the patient and she is in agreement.  CODE STATUS: full  TOTAL TIME TAKING CARE OF THIS PATIENT: 30 minutes.     POSSIBLE D/C 1-2 days, DEPENDING ON CLINICAL CONDITION.   Bettey Costa M.D on 06/20/2019 at 1:33 PM  Between 7am to 6pm - Pager - [phone] After 6pm go to [email or web address] - password [email or web address] [facility]  Office  [phone]  CC: Primary care physician; Olin Hauser, DO  Note: This dictation was prepared with Dragon dictation along with  smaller phrase technology. Any transcriptional errors that result from this process are unintentional.

## 2019-06-20 NOTE — Progress Notes (Signed)
Patient continues to complain about mid back pain.  Back assessed and patient has swollen area around epidural insertion site. Dr. Alva Garnet notified and at bedside assessing site.

## 2019-06-20 NOTE — Consult Note (Signed)
Cardiology Consultation:   Patient ID: SHADANA PRY MRN: [id number]; DOB: Mar 16, 1996  Admit date: 06/18/2019 Date of Consult: 06/20/2019  Primary Care Provider: Olin Hauser, DO Primary Cardiologist: No primary care provider on file.  Primary Electrophysiologist:  None    Patient Profile:   Patricia Pearson is a 23 y.o. female no hx of medical problems who is being seen today for the evaluation of tachycardia at the request of Dr Alva Garnet.  History of Present Illness:   Patricia Pearson a 23 year old G1, P1 woman status post vaginal delivery 06/16/2019 whose pregnancy was complicated by preeclampsia.  She was initially admitted June 18, 2019 with fever and urinary tract infection.  She developed tachycardia and acute blood loss anemia requiring transfer to a stepdown unit.  She then developed shortness of breath, chest tightness, and tachycardia.  She is now treated with IV antibiotics for pyelonephritis.  She has been noted to have persistent sinus tachycardia.  A CT pulmonary embolus study was done and was negative for acute PE.  An echocardiogram has been performed with interpretation currently pending.  The patient is continued to have heart rates in the 130-140s.  At the time of my evaluation she is in no acute distress.  She denies active chest pain but has experienced episodes of chest pressure.  This generally occurs when she is having chills.  She can feel her heart beating fast and has had some trouble taking deep breaths.  She feels swollen all over.  No orthopnea or PND.  Heart Pathway Score:     Past Medical History:  Diagnosis Date   Anemia    Anxiety    Kidney stone     Past Surgical History:  Procedure Laterality Date   STENT PLACEMENT RT URETER ([facility] HX) Right 04/09/2013   WISDOM TOOTH EXTRACTION  2016     Home Medications:  Prior to Admission medications   Medication Sig Start Date End Date Taking? Authorizing Provider  acetaminophen  (TYLENOL) 325 MG tablet Take 650 mg by mouth every 6 (six) hours as needed.   Yes [provider]  benzocaine-Menthol (DERMOPLAST) 20-0.5 % AERO Apply 1 application topically as needed for irritation (perineal discomfort). 06/18/19  Yes Schermerhorn, Gwen Her, MD  ferrous sulfate 325 (65 FE) MG EC tablet Take 325 mg by mouth daily.   Yes [provider]  ibuprofen (ADVIL) 200 MG tablet Take 200 mg by mouth every 6 (six) hours as needed.   Yes [provider]  ibuprofen (ADVIL) 600 MG tablet Take 1 tablet (600 mg total) by mouth every 6 (six) hours. 06/18/19  Yes Schermerhorn, Gwen Her, MD  norethindrone (ORTHO MICRONOR) 0.35 MG tablet Take 1 tablet (0.35 mg total) by mouth daily. 06/18/19 06/17/20 Yes Schermerhorn, Gwen Her, MD  Prenatal Vit-Fe Fumarate-FA (MULTIVITAMIN-PRENATAL) 27-0.8 MG TABS tablet Take 1 tablet by mouth daily at 12 noon.   Yes [provider]    Inpatient Medications: Scheduled Meds:  acetaminophen  1,000 mg Oral Q6H   docusate sodium  100 mg Oral Daily   ferrous sulfate  325 mg Oral BID WC   prenatal multivitamin  1 tablet Oral Q1200   Continuous Infusions:  meropenem (MERREM) IV 1 g (06/20/19 1410)   sodium chloride     PRN Meds: benzocaine-Menthol, calcium carbonate, docusate sodium, ibuprofen, ondansetron (ZOFRAN) IV, zolpidem  Allergies:   No Known Allergies  Social History:   Social History   Socioeconomic History   Marital status: Single  Spouse name: Not on file   Number of children: Not on file   Years of education: College   Highest education level: Not on file  Occupational History   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[place])    Comment: Audiological scientist (anticipate graduate Spring 2019)  Social Needs   Financial resource strain: Not on file   Food insecurity    Worry: Not on file    Inability: Not on file   Transportation needs    Medical: Not on file    Non-medical: Not on file   Tobacco Use   Smoking status: Never Smoker   Smokeless tobacco: Never Used  Substance and Sexual Activity   Alcohol use: No   Drug use: No   Sexual activity: Yes    Birth control/protection: Pill  Lifestyle   Physical activity    Days per week: Not on file    Minutes per session: Not on file   Stress: Not on file  Relationships   Social connections    Talks on phone: Not on file    Gets together: Not on file    Attends religious service: Not on file    Active member of club or organization: Not on file    Attends meetings of clubs or organizations: Not on file    Relationship status: Not on file   Intimate partner violence    Fear of current or ex partner: Not on file    Emotionally abused: Not on file    Physically abused: Not on file    Forced sexual activity: Not on file  Other Topics Concern   Not on file  Social History Narrative   Pt. Lives with her boyfriend Loel Dubonnet [phone]    Family History:    Family History  Problem Relation Age of Onset   Basal cell carcinoma Mother    Anxiety disorder Mother    Drug abuse Father    Peptic Ulcer Father    Heart failure Maternal Grandfather    Lung cancer Paternal Grandmother    Colon cancer Neg Hx    Cervical cancer Neg Hx    Breast cancer Neg Hx      ROS:  Please see the history of present illness.  All other ROS reviewed and negative.     Physical Exam/Data:   Vitals:   06/20/19 1330 06/20/19 1346 06/20/19 1400 06/20/19 1430  BP:  (!) 125/92 121/82   Pulse: (!) 142  (!) 133 (!) 135  Resp: (!) 22  (!) 21 (!) 24  Temp:  99.2 F (37.3 C)  (!) 102.9 F (39.4 C)  TempSrc:  Oral  Oral  SpO2: 100%  100% 100%  Weight:      Height:        Intake/Output Summary (Last 24 hours) at 06/20/2019 1516 Last data filed at 06/20/2019 1410 Gross per 24 hour  Intake 1240.94 ml  Output 500 ml  Net 740.94 ml   Last 3 Weights 06/19/2019 06/18/2019 06/15/2019  Weight (lbs) 169 lb 1.5 oz 164  lb 166 lb  Weight (kg) 76.7 kg 74.39 kg 75.297 kg     Body mass index is 29.95 kg/m.  General:  Well nourished, well developed, pale appearing, in no acute distress HEENT: normal Lymph: no adenopathy Neck: no JVD Endocrine:  No thryomegaly Vascular: No carotid bruits; FA pulses 2+ bilaterally  Cardiac:  normal S1, S2; RRR; no murmur  Lungs:  clear to auscultation bilaterally, no wheezing, rhonchi or rales  Abd: soft, nontender, no hepatomegaly  Ext: Mild diffuse edema Musculoskeletal:  No deformities, BUE and BLE strength normal and equal Skin: warm and dry  Neuro:  CNs 2-12 intact, no focal abnormalities noted Psych:  Normal affect   EKG:  The EKG was personally reviewed and demonstrates: Sinus tachycardia 138 bpm, baseline shivering artifact  Telemetry:  Telemetry was personally reviewed and demonstrates: Sinus tachycardia  Relevant CV Studies: The patient's 2D echo images are personally reviewed.  LV function is grossly normal with an estimated ejection fraction of 55%.  The RV functions normally with no evidence of dilatation.  There is mild to moderate mitral regurgitation.  No pericardial effusion.  Laboratory Data:  High Sensitivity Troponin:   Recent Labs  Lab 06/19/19 0947  TROPONINIHS 4     Cardiac EnzymesNo results for input(s): TROPONINI in the last 168 hours. No results for input(s): TROPIPOC in the last 168 hours.  Chemistry Recent Labs  Lab 06/19/19 1504 06/19/19 2110 06/20/19 0509  NA 137 136 136  K 3.2* 3.3* 4.0  CL 109 108 108  CO2 20* 21* 21*  GLUCOSE 97 114* 91  BUN 7 8 10   CREATININE 0.50 0.51 0.48  CALCIUM 7.9* 7.9* 8.2*  GFRNONAA >60 >60 >60  GFRAA >60 >60 >60  ANIONGAP 8 7 7     Recent Labs  Lab 06/19/19 0445 06/19/19 1504 06/20/19 0509  PROT 5.0* 5.2* 5.0*  ALBUMIN 2.0* 2.2* 2.0*  AST 28 48* 37  ALT 15 25 25   ALKPHOS 122 112 104  BILITOT 0.5 0.4 0.4   Hematology Recent Labs  Lab 06/19/19 1504 06/19/19 2110 06/20/19 0509    WBC 7.9 11.2* 8.1  RBC 3.02* 2.95* 2.79*  HGB 8.6* 8.4* 7.9*  HCT 26.1* 25.7* 24.6*  MCV 86.4 87.1 88.2  MCH 28.5 28.5 28.3  MCHC 33.0 32.7 32.1  RDW 13.7 13.9 14.0  PLT 139* 133* 133*   BNPNo results for input(s): BNP, PROBNP in the last 168 hours.  DDimer No results for input(s): DDIMER in the last 168 hours.   Radiology/Studies:  Ct Angio Chest Pe W Or Wo Contrast  Result Date: 06/19/2019 CLINICAL DATA:  Chest tightness and fever. EXAM: CT ANGIOGRAPHY CHEST WITH CONTRAST TECHNIQUE: Multidetector CT imaging of the chest was performed using the standard protocol during bolus administration of intravenous contrast. Multiplanar CT image reconstructions and MIPs were obtained to evaluate the vascular anatomy. CONTRAST:  57mL OMNIPAQUE IOHEXOL 350 MG/ML SOLN COMPARISON:  None. FINDINGS: Cardiovascular: Some of the central and peripheral pulmonary artery branches are difficult to characterize due to mild patient motion artifact and blood flow artifact, however, there is no convincing pulmonary embolism identified within the main, lobar or segmental pulmonary arteries bilaterally. No occlusive pulmonary embolus identified. No thoracic aortic aneurysm or evidence of aortic dissection. Heart size is upper normal. No pericardial effusion. Mediastinum/Nodes: No mass or enlarged lymph nodes seen within the mediastinum or perihilar regions. Esophagus appears normal. Trachea and central bronchi are unremarkable. Lungs/Pleura: Mixed nodular and ground-glass consolidation within the RIGHT lower lobe and along the posterior margin of the RIGHT upper lobe. Some component may represent atelectasis. Small bilateral pleural effusions w, RIGHT slightly greater than LEFT. Upper Abdomen: Limited images of the upper abdomen are unremarkable. Musculoskeletal: No acute or suspicious osseous finding. Review of the MIP images confirms the above findings. IMPRESSION: 1. No pulmonary embolus seen, with mild study limitations  detailed above. 2. Mixed nodular and ground-glass consolidations within the RIGHT lower lobe and along  the posterior margin of the RIGHT upper lobe. Some component may represent atelectasis. Differential includes atypical pneumonias such as viral or fungal, interstitial pneumonias, edema related to volume overload/CHF, hypersensitivity pneumonitis, and respiratory bronchiolitis. 3. Small bilateral pleural effusions, RIGHT slightly greater than LEFT. 4. Heart size is upper normal. No pericardial effusion. Electronically Signed   By: Franki Cabot M.D.   On: 06/19/2019 16:18   Ct Abdomen Pelvis W Contrast  Result Date: 06/19/2019 CLINICAL DATA:  Chest tightness and fever. Recent vaginal delivery. Low back pain and headache. EXAM: CT ABDOMEN AND PELVIS WITH CONTRAST TECHNIQUE: Multidetector CT imaging of the abdomen and pelvis was performed using the standard protocol following bolus administration of intravenous contrast. CONTRAST:  81mL OMNIPAQUE IOHEXOL 350 MG/ML SOLN COMPARISON:  None. FINDINGS: Lower chest: Small bilateral pleural effusions, with associated mild atelectasis. Hepatobiliary: No focal liver abnormality is seen. No gallstones, gallbladder wall thickening, or biliary dilatation. Pancreas: Unremarkable. No pancreatic ductal dilatation or surrounding inflammatory changes. Spleen: Normal in size without focal abnormality. Adrenals/Urinary Tract: Adrenal glands appear normal. Ill-defined edema throughout of the majority of the RIGHT renal cortex, indicating pyelonephritis. Associated perinephric fluid/inflammation. Questionable additional edema at the upper pole of the LEFT kidney, but not definitive. Mild bilateral hydronephrosis ww, likely physiologic related to the recently gravid uterus. 1 mm RIGHT renal stone Stomach/Bowel: z fairly large amount of stool within the rectosigmoid colon. Or proximal segments of the large bowel are normal in caliber. No small bowel dilatation. Stomach is unremarkable,  partially decompressed. Appendix is normal. Vascular/Lymphatic: No significant vascular findings are present. No enlarged abdominal or pelvic lymph nodes. Reproductive: Enlarged uterus, compatible with the given history of recent pregnancy. Other: Small amount of free fluid in the pelvis. No abscess collection identified. No free intraperitoneal air. Musculoskeletal: No acute or suspicious osseous finding. IMPRESSION: 1. Pyelonephritis of the RIGHT kidney, of a fairly severe degree, with edema throughout the RIGHT renal cortex and prominent perinephric fluid/inflammation. Questionable additional pyelonephritis at the upper pole of the LEFT kidney, but not convincing. 2. Mild bilateral hydronephrosis, likely physiologic related to the recently gravid uterus. 3. 1 mm RIGHT renal stone. 4. Fairly large amount of stool within the rectosigmoid colon (constipation? ). 5. Small bilateral pleural effusions, with associated mild atelectasis. 6. No abscess collection identified. However, would consider follow-up CT abdomen within 1 week to exclude early developing abscess within the RIGHT renal cortex. Electronically Signed   By: Franki Cabot M.D.   On: 06/19/2019 16:42   Dg Chest Port 1 View  Result Date: 06/20/2019 CLINICAL DATA:  Respiratory failure. EXAM: PORTABLE CHEST 1 VIEW COMPARISON:  06/19/2019 FINDINGS: Normal heart size. No pleural effusion or edema identified. No airspace consolidation identified. No atelectasis. The visualized osseous structures are unremarkable. IMPRESSION: 1. No acute cardiopulmonary abnormality. Electronically Signed   By: Kerby Moors M.D.   On: 06/20/2019 08:36   Dg Chest Port 1 View  Result Date: 06/18/2019 CLINICAL DATA:  Fever today. Recent postpartum post vaginal delivery. EXAM: PORTABLE CHEST 1 VIEW COMPARISON:  None. FINDINGS: The cardiomediastinal contours are normal. Scattered subsegmental atelectasis. No confluent airspace disease. Pulmonary vasculature is normal. No  large pleural effusion or pneumothorax. No acute osseous abnormalities are seen. IMPRESSION: Scattered subsegmental atelectasis. Electronically Signed   By: Keith Rake M.D.   On: 06/18/2019 18:59    Assessment and Plan:   1. Tachycardia: I reviewed her telemetry and EKG tracings and I think all of this is sinus tachycardia in the setting of infection  and anemia.  I do not appreciate any evidence of supraventricular arrhythmia or atrial fibrillation. 2. Moderate mitral regurgitation: I cannot appreciate an MR murmur on exam.  The mitral valve itself appears anatomically normal.  Would check a repeat echocardiogram on an outpatient basis once she is back to her baseline to follow-up.  I do not think this is a clinically significant part of her presentation at this point.   For questions or updates, please contact [place] Please consult [email or web address] for contact info under     Signed, Sherren Mocha, MD  06/20/2019 3:16 PM

## 2019-06-20 NOTE — Progress Notes (Signed)
Patient started on lovenox for pelvic thrombophlebitis per Dr.. Leonides Schanz. Dr Leonides Schanz has reviewed the risks and benefits of anticoagulation with patient and she has agreed to start treatment Will monitor for bleeding. Kyan Yurkovich S. Tukov-Yual ANP-BC Pulmonary and [place] Pager [phone] or [phone]  NB: This document was prepared using Dragon voice recognition software and may include unintentional dictation errors.

## 2019-06-20 NOTE — Progress Notes (Signed)
PHARMACIST - PHYSICIAN COMMUNICATION  CONCERNING: IV to Oral Route Change Policy  RECOMMENDATION: This patient is receiving famotidine by the intravenous route.  Based on criteria approved by the Pharmacy and Therapeutics Committee, the intravenous medication(s) is/are being converted to the equivalent oral dose form(s).   DESCRIPTION: These criteria include:  The patient is eating (either orally or via tube) and/or has been taking other orally administered medications for a least 24 hours  The patient has no evidence of active gastrointestinal bleeding or impaired GI absorption (gastrectomy, short bowel, patient on TNA or NPO).  If you have questions about this conversion, please contact the Pharmacy Department at [phone].  Robyn Nohr L, [facility] 06/20/2019 7:36 AM

## 2019-06-20 NOTE — Lactation Note (Signed)
Lactation Consultation Note  Patient Name: ARDYN FORGE [id number] Date: 06/20/2019   Mom's breasts are firm, warm to touch and tender.  Set up Symphony pump in ICU Rm 17 and encouraged to pump 8 or more times in 24 hours.  Mom had not pumped or breast fed for 3 days.  Assisted mom with first pumping and she expressed 15 ml.  Mom's breasts are softer after pumping.  Mom continues to leak transitional milk from left breast. Discussed with mom's nurse medications mom has been taking and compatibility with providing expressed milk to [place]. Father of baby going to transport milk home to [facility] for next feeding.  Loreli Slot has been getting 1 1/2 to 2 oz of formula since mom has been in ICU.  Instructed in warmth, massage, hand expression, pumping, cleaning, labeling, collection, storage and handling of expressed milk.  Mom has Ameda DEBP at home she received through her insurance.  Lactation number written on white board and encouraged to call lactation nurse or third floor nurse with any questions, concerns or assistance.    Maternal Data    Feeding    LATCH Score                   Interventions    Lactation Tools Discussed/Used     Consult Status      Jarold Motto 06/20/2019, 7:31 PM

## 2019-06-20 NOTE — Progress Notes (Signed)
She is much improved today.  She feels better and tachycardia has resolved.  She has no new complaints  Vitals:   06/20/19 0800 06/20/19 0900 06/20/19 1000 06/20/19 1200  BP: 108/74 108/73 100/63 105/72  Pulse: 98 (!) 105 93 94  Resp: 10 17 15  (!) 24  Temp:      TempSrc:      SpO2: 99% 100% 100% 100%  Weight:      Height:        Gen: NAD HEENT: NCAT, sclerae white Neck: No JVD Lungs: breath sounds full, no wheezes or other adventitious sounds Cardiovascular: RRR, no murmurs Abdomen: Soft, nontender, normal BS Ext: without clubbing, cyanosis, edema Neuro: grossly intact Skin: Limited exam, no lesions noted  BMP Latest Ref Rng & Units 06/20/2019 06/19/2019 06/19/2019  Glucose 70 - 99 mg/dL 91 114(H) 97  BUN 6 - 20 mg/dL 10 8 7   Creatinine 0.44 - 1.00 mg/dL 0.48 0.51 0.50  BUN/Creat Ratio 6 - 22 (calc) - - -  Sodium 135 - 145 mmol/L 136 136 137  Potassium 3.5 - 5.1 mmol/L 4.0 3.3(L) 3.2(L)  Chloride 98 - 111 mmol/L 108 108 109  CO2 22 - 32 mmol/L 21(L) 21(L) 20(L)  Calcium 8.9 - 10.3 mg/dL 8.2(L) 7.9(L) 7.9(L)    CBC Latest Ref Rng & Units 06/20/2019 06/19/2019 06/19/2019  WBC 4.0 - 10.5 K/uL 8.1 11.2(H) 7.9  Hemoglobin 12.0 - 15.0 g/dL 7.9(L) 8.4(L) 8.6(L)  Hematocrit 36.0 - 46.0 % 24.6(L) 25.7(L) 26.1(L)  Platelets 150 - 400 K/uL 133(L) 133(L) 139(L)    CXR: Normal  IMPRESSION: Post partum Severe sepsis due to R pyelonephritis Sinus tachy resolved  PLAN/REC: Continue meropenem for now noting that she improved dramatically shortly after administration of first dose Follow-up culture results Would plan on 7-day course of antibiotics Transfer out of ICU/SDU  After transfer, PCCM will sign off. Please call if we can be of further assistance    Merton Border, MD PCCM service Mobile [phone] Pager [phone] 06/20/2019 12:33 PM

## 2019-06-20 NOTE — Consult Note (Signed)
Pharmacy Antibiotic Note  Patricia Pearson is a 23 y.o. female admitted on 06/18/2019 with UTI.  Pharmacy has been consulted for Meropenem dosing.  Plan: Urine culture showing insignificant growth. No noted history of ESBL infection and patient has no known allergies. Please consider narrowing therapy as appropriate.   Continue meropenem 1g IV Q8hr.   Height: 5\' 3"  (160 cm) Weight: 169 lb 1.5 oz (76.7 kg) IBW/kg (Calculated) : 52.4  Temp (24hrs), Avg:99 F (37.2 C), Min:98.1 F (36.7 C), Max:102.9 F (39.4 C)  Recent Labs  Lab 06/18/19 1834 06/18/19 2058 06/19/19 0445 06/19/19 1504 06/19/19 2110 06/20/19 0509  WBC 9.0  --  7.3 7.9 11.2* 8.1  CREATININE 0.62  --  0.49 0.50 0.51 0.48  LATICACIDVEN 2.1* 0.8  --  1.3  --   --     Estimated Creatinine Clearance: 110 mL/min (by C-G formula based on SCr of 0.48 mg/dL).    No Known Allergies  Antimicrobials this admission: Gentamicin 8/13 x 1 CTX 8/14 x 1 Clindamycin 8/13 >> 8/14 Meropenem 8/14 >>   Dose adjustments this admission: None  Microbiology results: 8/13 BCx: no growth x 2 days  8/13 UCx: < 10K insignificant growth  8/13 MRSA PCR: negative  8/13 COVID: negative   Thank you for allowing pharmacy to be a part of this patient's care.  Simpson,Michael L 06/20/2019 3:05 PM

## 2019-06-21 ENCOUNTER — Encounter: Payer: Self-pay | Admitting: Urology

## 2019-06-21 LAB — CBC WITH DIFFERENTIAL/PLATELET
Abs Immature Granulocytes: 0.12 10*3/uL — ABNORMAL HIGH (ref 0.00–0.07)
Basophils Absolute: 0 10*3/uL (ref 0.0–0.1)
Basophils Relative: 0 %
Eosinophils Absolute: 0.1 10*3/uL (ref 0.0–0.5)
Eosinophils Relative: 1 %
HCT: 23.9 % — ABNORMAL LOW (ref 36.0–46.0)
Hemoglobin: 7.9 g/dL — ABNORMAL LOW (ref 12.0–15.0)
Immature Granulocytes: 2 %
Lymphocytes Relative: 15 %
Lymphs Abs: 1.1 10*3/uL (ref 0.7–4.0)
MCH: 28.7 pg (ref 26.0–34.0)
MCHC: 33.1 g/dL (ref 30.0–36.0)
MCV: 86.9 fL (ref 80.0–100.0)
Monocytes Absolute: 0.4 10*3/uL (ref 0.1–1.0)
Monocytes Relative: 6 %
Neutro Abs: 5.7 10*3/uL (ref 1.7–7.7)
Neutrophils Relative %: 76 %
Platelets: 164 10*3/uL (ref 150–400)
RBC: 2.75 MIL/uL — ABNORMAL LOW (ref 3.87–5.11)
RDW: 13.9 % (ref 11.5–15.5)
WBC: 7.4 10*3/uL (ref 4.0–10.5)
nRBC: 0 % (ref 0.0–0.2)

## 2019-06-21 LAB — CBC
HCT: 24.4 % — ABNORMAL LOW (ref 36.0–46.0)
Hemoglobin: 7.7 g/dL — ABNORMAL LOW (ref 12.0–15.0)
MCH: 28 pg (ref 26.0–34.0)
MCHC: 31.6 g/dL (ref 30.0–36.0)
MCV: 88.7 fL (ref 80.0–100.0)
Platelets: 147 10*3/uL — ABNORMAL LOW (ref 150–400)
RBC: 2.75 MIL/uL — ABNORMAL LOW (ref 3.87–5.11)
RDW: 14 % (ref 11.5–15.5)
WBC: 8 10*3/uL (ref 4.0–10.5)
nRBC: 0 % (ref 0.0–0.2)

## 2019-06-21 LAB — MAGNESIUM: Magnesium: 1.4 mg/dL — ABNORMAL LOW (ref 1.7–2.4)

## 2019-06-21 LAB — PROCALCITONIN: Procalcitonin: 23.02 ng/mL

## 2019-06-21 LAB — PHOSPHORUS: Phosphorus: 4.1 mg/dL (ref 2.5–4.6)

## 2019-06-21 MED ORDER — MAGNESIUM SULFATE 2 GM/50ML IV SOLN
2.0000 g | Freq: Once | INTRAVENOUS | Status: AC
  Administered 2019-06-21: 2 g via INTRAVENOUS
  Filled 2019-06-21: qty 50

## 2019-06-21 MED ORDER — ENOXAPARIN SODIUM 80 MG/0.8ML ~~LOC~~ SOLN
1.0000 mg/kg | Freq: Two times a day (BID) | SUBCUTANEOUS | Status: DC
  Administered 2019-06-21 – 2019-06-23 (×4): 75 mg via SUBCUTANEOUS
  Filled 2019-06-21 (×5): qty 0.8

## 2019-06-21 NOTE — Lactation Note (Signed)
Lactation Consultation Note  Patient Name: Patricia Pearson Today's Date: 06/21/2019   When mom came back to Room 347 on mother/baby unit, she was overwhelmed, hurting, emotional and anxious.  She states she no longer wants to pump or breast feed.  She only wants to deal with one issue at a time and that would be just feeling better without the responsibility of pumping right now.  She wants to know how to dry up her milk.  She reports that [facility] is tolerating formula just fine.  Mom's right breast was firm, warm to touch but mom voices very little discomfort.  Her left breast has some filling but is leaking milk.  Discouraged warmth or stimulation of breasts.  Assisted mom with putting on well-fitting supportive bra.  Comfort gels applied.  Ice packs applied.  Mom left on ice packs until breasts felt better and started having chills with increasing fever.  Later in the day breasts started filling again and becoming uncomfortable, especially right breast.  Cabbage leaves applied and reapplied when wilted.  More cold comfort gels applied.  Mom felt much better.  Lactation name and number written on white board and encouraged to call with any questions, concerns or assistance.    Maternal Data    Feeding    LATCH Score                   Interventions    Lactation Tools Discussed/Used     Consult Status      Jarold Motto 06/21/2019, 8:11 PM

## 2019-06-21 NOTE — Progress Notes (Signed)
Pt stated she feels hot all over and stiff after receiving IV abx, states it makes her feel worse. Pt concerned it is making her feel bad. MD and oncoming shift RN notified. MD will follow up with orders, next dose due at 0200.

## 2019-06-21 NOTE — Progress Notes (Signed)
Pt expressed concerns of "having another episode" today, stating "this happens the same time each day'. Pt is currently afebrile, HR in 80's. Denies pain or sob. Incentive spirometer reviewed, pt's goal 1500 reached. Will continue to monitor, pt ordered lunch tray.

## 2019-06-21 NOTE — Consult Note (Signed)
Subjective: CC: fever.  Hx: Patricia Pearson is a 23 yo WF who I was asked to see in consultation by Dr. Larey Days for post partum fever with bilateral pyelonephritis and a small right renal stone.  Patricia Pearson has had 3 prior stone episodes with the last in early June when she passed a stone.   She has had prior stenting and a percutaneous tube with whitaker test to r/o obstruction at [facility] in 2016.   She delivered on 8/11 and was admitted on 8/13 with fever to 103 and chills with rigors and tachycardia.   She had a CT on 814 that showed bilateral pyelonephritis with mild bilateral hydro that was felt to be from the post partum uterus and a 68mm right renal stones.   She has been on Meropenem but has had persistent fevers to 103 each afternoon with tachycardia.   Blood cultures are negative and the Urine culture grew <10K colonies.   She denies significant pain and has some urgency but no hematuria or other voiding complaints.   Her Cr is normal and the mild leukocytosis has resolved.  ROS:  Review of Systems  Constitutional: Negative for chills and fever.  Genitourinary: Positive for urgency.  All other systems reviewed and are negative.   No Known Allergies  Past Medical History:  Diagnosis Date  . Anemia   . Anxiety   . Kidney stone     Past Surgical History:  Procedure Laterality Date  . STENT PLACEMENT RT URETER ([place] HX) Right 04/09/2013  . WISDOM TOOTH EXTRACTION  2016    Social History   Socioeconomic History  . Marital status: Single    Spouse name: Not on file  . Number of children: Not on file  . Years of education: College  . Highest education level: Not on file  Occupational History  . 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 Financial resource strain: Not on file  . Food insecurity    Worry: Not on file    Inability: Not on file  . Transportation needs    Medical: Not on  file    Non-medical: Not on file  Tobacco Use  . Smoking status: Never Smoker  . Smokeless tobacco: Never Used  Substance and Sexual Activity  . Alcohol use: No  . Drug use: No  . Sexual activity: Yes    Birth control/protection: Pill  Lifestyle  . Physical activity    Days per week: Not on file    Minutes per session: Not on file  . Stress: Not on file  Relationships  . Social Herbalist on phone: Not on file    Gets together: Not on file    Attends religious service: Not on file    Active member of club or organization: Not on file    Attends meetings of clubs or organizations: Not on file    Relationship status: Not on file  . Intimate partner violence    Fear of current or ex partner: Not on file    Emotionally abused: Not on file    Physically abused: Not on file    Forced sexual activity: Not on file  Other Topics Concern  . Not on file  Social History Narrative   Pt. Lives with her boyfriend Loel Dubonnet [phone]    Family History  Problem Relation Age of Onset  . Basal cell carcinoma Mother   .  Anxiety disorder Mother   . Drug abuse Father   . Peptic Ulcer Father   . Heart failure Maternal Grandfather   . Lung cancer Paternal Grandmother   . Colon cancer Neg Hx   . Cervical cancer Neg Hx   . Breast cancer Neg Hx     Anti-infectives: Anti-infectives (From admission, onward)   Start     Dose/Rate Route Frequency Ordered Stop   06/19/19 2200  meropenem (MERREM) 2 g in sodium chloride 0.9 % 100 mL IVPB  Status:  Discontinued     2 g 200 mL/hr over 30 Minutes Intravenous Every 8 hours 06/19/19 1809 06/19/19 1811   06/19/19 1815  meropenem (MERREM) 1 g in sodium chloride 0.9 % 100 mL IVPB     1 g 200 mL/hr over 30 Minutes Intravenous Every 8 hours 06/19/19 1811     06/19/19 1200  cefTRIAXone (ROCEPHIN) 1 g in sodium chloride 0.9 % 100 mL IVPB  Status:  Discontinued     1 g 200 mL/hr over 30 Minutes Intravenous Every 24 hours 06/19/19 1102  06/19/19 1731   06/19/19 0300  clindamycin (CLEOCIN) IVPB 900 mg  Status:  Discontinued     900 mg 100 mL/hr over 30 Minutes Intravenous Every 8 hours 06/18/19 2300 06/19/19 1102   06/18/19 2200  gentamicin (GARAMYCIN) 380 mg in dextrose 5 % 100 mL IVPB  Status:  Discontinued     5 mg/kg  75.3 kg 109.5 mL/hr over 60 Minutes Intravenous Every 24 hours 06/18/19 1953 06/19/19 1102   06/18/19 2200  clindamycin (CLEOCIN) IVPB 900 mg  Status:  Discontinued     900 mg 100 mL/hr over 30 Minutes Intravenous Every 8 hours 06/18/19 1953 06/18/19 2300   06/18/19 1830  clindamycin (CLEOCIN) IVPB 900 mg  Status:  Discontinued     900 mg 100 mL/hr over 30 Minutes Intravenous  Once 06/18/19 1827 06/18/19 1930   06/18/19 1830  gentamicin (GARAMYCIN) 110 mg in dextrose 5 % 50 mL IVPB  Status:  Discontinued     1.5 mg/kg  75.3 kg 105.5 mL/hr over 30 Minutes Intravenous  Once 06/18/19 1827 06/18/19 1924      Current Facility-Administered Medications  Medication Dose Route Frequency Provider Last Rate Last Dose  . acetaminophen (TYLENOL) tablet 1,000 mg  1,000 mg Oral Q6H Schermerhorn, Gwen Her, MD   1,000 mg at 06/21/19 1530  . benzocaine-Menthol (DERMOPLAST) 20-0.5 % topical spray 1 application  1 application Topical TID PRN Schermerhorn, Gwen Her, MD      . calcium carbonate (TUMS - dosed in mg elemental calcium) chewable tablet 400 mg of elemental calcium  2 tablet Oral Q4H PRN Schermerhorn, Gwen Her, MD   400 mg of elemental calcium at 06/19/19 0245  . docusate sodium (COLACE) capsule 100 mg  100 mg Oral Daily Schermerhorn, Gwen Her, MD   100 mg at 06/21/19 0816  . docusate sodium (COLACE) capsule 100 mg  100 mg Oral BID PRN Schermerhorn, Gwen Her, MD      . enoxaparin (LOVENOX) injection 75 mg  1 mg/kg Subcutaneous Q12H Schermerhorn, Gwen Her, MD      . ferrous sulfate tablet 325 mg  325 mg Oral BID WC Lisette Grinder, CNM   325 mg at 06/21/19 0815  . ibuprofen (ADVIL) tablet 600 mg  600 mg Oral  Q6H PRN Schermerhorn, Gwen Her, MD   600 mg at 06/20/19 1437  . meropenem (MERREM) 1 g in sodium chloride 0.9 % 100 mL IVPB  1 g Intravenous Q8H Lu Duffel, RPH 200 mL/hr at 06/21/19 0559 1 g at 06/21/19 0559  . ondansetron (ZOFRAN) injection 4 mg  4 mg Intravenous Q6H PRN Wilhelmina Mcardle, MD   4 mg at 06/19/19 1333  . prenatal multivitamin tablet 1 tablet  1 tablet Oral Q1200 Schermerhorn, Gwen Her, MD   1 tablet at 06/21/19 1531  . sodium chloride 0.9 % bolus 1,000 mL  1,000 mL Intravenous Once Lilia Pro., MD      . zolpidem [facility]) tablet 5 mg  5 mg Oral QHS PRN Schermerhorn, Gwen Her, MD         Objective: Vital signs in last 24 hours: Temp:  [98.2 F (36.8 C)-102.8 F (39.3 C)] 102.8 F (39.3 C) (08/16 1634) Pulse Rate:  [80-98] 92 (08/16 1634) Resp:  [12-30] 22 (08/16 1634) BP: (92-132)/(60-96) 132/89 (08/16 1530) SpO2:  [98 %-100 %] 98 % (08/16 1634)  Intake/Output from previous day: 08/15 0701 - 08/16 0700 In: 1300 [P.O.:1200; IV Piggyback:100] Out: 500 [Urine:500] Intake/Output this shift: Total I/O In: 273.5 [IV Piggyback:273.5] Out: -    Physical Exam Vitals signs reviewed.  Constitutional:      Appearance: Normal appearance.  HENT:     Head: Normocephalic and atraumatic.  Neck:     Musculoskeletal: Normal range of motion and neck supple.  Cardiovascular:     Rate and Rhythm: Normal rate and regular rhythm.     Heart sounds: Normal heart sounds.  Pulmonary:     Effort: Pulmonary effort is normal. No respiratory distress.     Breath sounds: Normal breath sounds.  Abdominal:     General: Abdomen is flat.     Palpations: Abdomen is soft.     Tenderness: There is right CVA tenderness (mild).  Musculoskeletal: Normal range of motion.        General: No swelling or tenderness.  Skin:    General: Skin is warm and dry.  Neurological:     General: No focal deficit present.     Mental Status: She is alert and oriented to person, place, and time.   Psychiatric:        Mood and Affect: Mood normal.        Behavior: Behavior normal.     Lab Results:  Recent Labs    06/20/19 0509 06/21/19 0525  WBC 8.1 8.0  HGB 7.9* 7.7*  HCT 24.6* 24.4*  PLT 133* 147*   BMET Recent Labs    06/19/19 2110 06/20/19 0509  NA 136 136  K 3.3* 4.0  CL 108 108  CO2 21* 21*  GLUCOSE 114* 91  BUN 8 10  CREATININE 0.51 0.48  CALCIUM 7.9* 8.2*   PT/INR Recent Labs    06/19/19 1504  LABPROT 15.3*  INR 1.2   ABG No results for input(s): PHART, HCO3 in the last 72 hours.  Invalid input(s): PCO2, PO2  Studies/Results: Dg Chest Port 1 View  Result Date: 06/20/2019 CLINICAL DATA:  Respiratory failure. EXAM: PORTABLE CHEST 1 VIEW COMPARISON:  06/19/2019 FINDINGS: Normal heart size. No pleural effusion or edema identified. No airspace consolidation identified. No atelectasis. The visualized osseous structures are unremarkable. IMPRESSION: 1. No acute cardiopulmonary abnormality. Electronically Signed   By: Kerby Moors M.D.   On: 06/20/2019 08:36   Notes reviewed.  CT reviewed.   Labs reviewed.  Pertinent notes reviewed.   Assessment: Bilateral pyelonephritis with negative blood and urine culture but persistent fever on Meropenem after initial treatment with  gentamycin and clindamycin followed by Ceftriaxone.  Consider ID consult for further recommendations and a repeat CT in the AM could be considered to insure that infection has not progressed to abscess formation.    .   9mm right renal stone with no ureteral stones.   She has mild bilateral hydro that is probably physiologic from her post partum uterus.  I don't think she needs stenting at this time.   She has had multiple stones and should f/u with urology for a metabolic w/u.   CC: Dr. Vikki Ports Ward,  Dr. Laverta Baltimore and  Dr.  Hollice Espy.     Irine Seal 06/21/2019 [phone]

## 2019-06-21 NOTE — Progress Notes (Signed)
Pt transferred from ICU-SD to Room 347. Report received from [place], [place]. Pt oriented to room, assessment completed, vital signs WNL. Denies pain, awaiting provider to round on pt.

## 2019-06-21 NOTE — Progress Notes (Signed)
Report given to RN for patient to be transferred to room 347. Patient vital signs WNL and no signs of distress. Patient updated family via personal cell phone.

## 2019-06-21 NOTE — Progress Notes (Signed)
NT reported elevated temp of 99.4 to this RN. Pt assessed, temp currently at 101. Pt complains of feeling cold, mild shivering. HR and BP slightly elevated but WNL. Pt asked for assistance to BR. Denies dizziness. Pt steady with ambulation. Upon returning from BR, pt's HR began to increase rapidly to 150's, pt began having severe shivering and tremors to all extremities. Pt quickly escorted to bed and covered with blankets. Complains of numbness to left arm and fingers, states "it feels dead". Tips of fingers noted with blue/gray appearance on both hands. Complains of numbness with tingling to right arm. Pt appears pale, anxious. VS obtained. MD (Dr. Leonides Schanz) rounding to bedside at this time, updated on pt status. Pt HR recovered quickly to 100-120's, warm blankets placed for comfort. Urology consult placed per Dr. Leonides Schanz. Tylenol given for fever. Will continue to monitor.

## 2019-06-21 NOTE — Plan of Care (Signed)
Patient afebrile this shift. Able to make needs known. Reports of pain 6/10 on left side of abdomen.  Offered pain medication, patient declined.  Encouraged increased fluid intake. Pumped breasts q 3 hours. Patient is considering stopping breast feeding.  No concerns at this time.  Will continue to monitor.

## 2019-06-21 NOTE — Progress Notes (Signed)
Dr. Leonides Schanz informed of pt. Statement that she had a Nose Bleed at appx. [place] and that the Pt. Had taken a picture of kleenex that she had wiped her nose with. I informed Dr. Leonides Schanz of Pt. Statement and that I had seen the picture of the tissue and that there was a sm-mod. Amount of bright red blood on kleenex. Pt. Stated her nose had elt 'stuffy" so she blew her nose and that is when the Nose Bleed occurred. I instructed Pt. On risk if Bleeding while on Lovenox and instructed her on other bleeding precautions and she v/o. I asked Dr. Leonides Schanz if she wanted to continue the Lovenox as ordered and she states, "Yes". Dr. Leonides Schanz also ordered a Stat CBC.  I also informed Dr. Leonides Schanz of Pt. Complaint that she has muscle " Tightness' and "Numbness" of fingers after receiving Meropenem. Dr. Leonides Schanz states she will d/c this medication and order another antibiotic for this pt.

## 2019-06-21 NOTE — Progress Notes (Signed)
Pt temp increased to 102.8, however pt reports feeling better. Shivering/tremors resolved, pt no longer feels cold. BP and HR stable. Complains of pain & leaking to IV site in Right A/C. IV team paged. IV antibiotic to be continued when IV access obtained. MD updated. Will continue to monitor.

## 2019-06-21 NOTE — Progress Notes (Signed)
[place] [facility] [place] NAME: Patricia Pearson    MR#:  [id number]  DATE OF BIRTH:  January 11, 1996  SUBJECTIVE:  Says she feels a bit confused this am and emotional from hormones   REVIEW OF SYSTEMS:    Review of Systems  Constitutional: Negative for fever, chills weight loss HENT: Negative for ear pain, nosebleeds, congestion, facial swelling, rhinorrhea, neck pain, neck stiffness and ear discharge.   Respiratory: Negative for cough, shortness of breath, wheezing  Cardiovascular: Negative for chest pain, palpitations and leg swelling.  Gastrointestinal: Negative for heartburn, abdominal pain, vomiting, diarrhea or consitpation Genitourinary:+dysuria, no urgency, frequency, hematuria Musculoskeletal: Negative for back pain or joint pain Neurological: Negative for dizziness, seizures, syncope, focal weakness,  numbness and headaches.  Hematological: Does not bruise/bleed easily.  Psychiatric/Behavioral: Negative for hallucinations, (++MILD confusion), dysphoric mood    Tolerating Diet: yes      DRUG ALLERGIES:  No Known Allergies  VITALS:  Blood pressure 126/67, pulse 83, temperature 98.5 F (36.9 C), temperature source Oral, resp. rate 18, height 5\' 3"  (1.6 m), weight 76.7 kg, SpO2 100 %, unknown if currently breastfeeding.  PHYSICAL EXAMINATION:  Constitutional: Appears well-developed and well-nourished. No distress. HENT: Normocephalic. Marland Kitchen Oropharynx is clear and moist.  Eyes: Conjunctivae and EOM are normal. PERRLA, no scleral icterus.  Neck: Normal ROM. Neck supple. No JVD. No tracheal deviation. CVS: RRR, S1/S2 +, no murmurs, no gallops, no carotid bruit.  Pulmonary: Effort and breath sounds normal, no stridor, rhonchi, wheezes, rales.  Abdominal: Soft. BS +,  no distension, tenderness, rebound or guarding.  Musculoskeletal: Normal range of motion. No edema and no tenderness.  Neuro: Alert. CN 2-12 grossly intact. No focal  deficits. Skin: Skin is warm and dry. No rash noted. Psychiatric: Normal mood and affect.      LABORATORY PANEL:   CBC Recent Labs  Lab 06/21/19 0525  WBC 8.0  HGB 7.7*  HCT 24.4*  PLT 147*   ------------------------------------------------------------------------------------------------------------------  Chemistries  Recent Labs  Lab 06/20/19 0509 06/21/19 0525  NA 136  --   K 4.0  --   CL 108  --   CO2 21*  --   GLUCOSE 91  --   BUN 10  --   CREATININE 0.48  --   CALCIUM 8.2*  --   MG  --  1.4*  AST 37  --   ALT 25  --   ALKPHOS 104  --   BILITOT 0.4  --    ------------------------------------------------------------------------------------------------------------------  Cardiac Enzymes No results for input(s): TROPONINI in the last 168 hours. ------------------------------------------------------------------------------------------------------------------  RADIOLOGY:  Ct Angio Chest Pe W Or Wo Contrast  Result Date: 06/19/2019 CLINICAL DATA:  Chest tightness and fever. EXAM: CT ANGIOGRAPHY CHEST WITH CONTRAST TECHNIQUE: Multidetector CT imaging of the chest was performed using the standard protocol during bolus administration of intravenous contrast. Multiplanar CT image reconstructions and MIPs were obtained to evaluate the vascular anatomy. CONTRAST:  69mL OMNIPAQUE IOHEXOL 350 MG/ML SOLN COMPARISON:  None. FINDINGS: Cardiovascular: Some of the central and peripheral pulmonary artery branches are difficult to characterize due to mild patient motion artifact and blood flow artifact, however, there is no convincing pulmonary embolism identified within the main, lobar or segmental pulmonary arteries bilaterally. No occlusive pulmonary embolus identified. No thoracic aortic aneurysm or evidence of aortic dissection. Heart size is upper normal. No pericardial effusion. Mediastinum/Nodes: No mass or enlarged lymph nodes seen within the mediastinum or perihilar regions.  Esophagus appears  normal. Trachea and central bronchi are unremarkable. Lungs/Pleura: Mixed nodular and ground-glass consolidation within the RIGHT lower lobe and along the posterior margin of the RIGHT upper lobe. Some component may represent atelectasis. Small bilateral pleural effusions w, RIGHT slightly greater than LEFT. Upper Abdomen: Limited images of the upper abdomen are unremarkable. Musculoskeletal: No acute or suspicious osseous finding. Review of the MIP images confirms the above findings. IMPRESSION: 1. No pulmonary embolus seen, with mild study limitations detailed above. 2. Mixed nodular and ground-glass consolidations within the RIGHT lower lobe and along the posterior margin of the RIGHT upper lobe. Some component may represent atelectasis. Differential includes atypical pneumonias such as viral or fungal, interstitial pneumonias, edema related to volume overload/CHF, hypersensitivity pneumonitis, and respiratory bronchiolitis. 3. Small bilateral pleural effusions, RIGHT slightly greater than LEFT. 4. Heart size is upper normal. No pericardial effusion. Electronically Signed   By: Franki Cabot M.D.   On: 06/19/2019 16:18   Ct Abdomen Pelvis W Contrast  Result Date: 06/19/2019 CLINICAL DATA:  Chest tightness and fever. Recent vaginal delivery. Low back pain and headache. EXAM: CT ABDOMEN AND PELVIS WITH CONTRAST TECHNIQUE: Multidetector CT imaging of the abdomen and pelvis was performed using the standard protocol following bolus administration of intravenous contrast. CONTRAST:  67mL OMNIPAQUE IOHEXOL 350 MG/ML SOLN COMPARISON:  None. FINDINGS: Lower chest: Small bilateral pleural effusions, with associated mild atelectasis. Hepatobiliary: No focal liver abnormality is seen. No gallstones, gallbladder wall thickening, or biliary dilatation. Pancreas: Unremarkable. No pancreatic ductal dilatation or surrounding inflammatory changes. Spleen: Normal in size without focal abnormality.  Adrenals/Urinary Tract: Adrenal glands appear normal. Ill-defined edema throughout of the majority of the RIGHT renal cortex, indicating pyelonephritis. Associated perinephric fluid/inflammation. Questionable additional edema at the upper pole of the LEFT kidney, but not definitive. Mild bilateral hydronephrosis ww, likely physiologic related to the recently gravid uterus. 1 mm RIGHT renal stone Stomach/Bowel: z fairly large amount of stool within the rectosigmoid colon. Or proximal segments of the large bowel are normal in caliber. No small bowel dilatation. Stomach is unremarkable, partially decompressed. Appendix is normal. Vascular/Lymphatic: No significant vascular findings are present. No enlarged abdominal or pelvic lymph nodes. Reproductive: Enlarged uterus, compatible with the given history of recent pregnancy. Other: Small amount of free fluid in the pelvis. No abscess collection identified. No free intraperitoneal air. Musculoskeletal: No acute or suspicious osseous finding. IMPRESSION: 1. Pyelonephritis of the RIGHT kidney, of a fairly severe degree, with edema throughout the RIGHT renal cortex and prominent perinephric fluid/inflammation. Questionable additional pyelonephritis at the upper pole of the LEFT kidney, but not convincing. 2. Mild bilateral hydronephrosis, likely physiologic related to the recently gravid uterus. 3. 1 mm RIGHT renal stone. 4. Fairly large amount of stool within the rectosigmoid colon (constipation? ). 5. Small bilateral pleural effusions, with associated mild atelectasis. 6. No abscess collection identified. However, would consider follow-up CT abdomen within 1 week to exclude early developing abscess within the RIGHT renal cortex. Electronically Signed   By: Franki Cabot M.D.   On: 06/19/2019 16:42   Dg Chest Port 1 View  Result Date: 06/20/2019 CLINICAL DATA:  Respiratory failure. EXAM: PORTABLE CHEST 1 VIEW COMPARISON:  06/19/2019 FINDINGS: Normal heart size. No  pleural effusion or edema identified. No airspace consolidation identified. No atelectasis. The visualized osseous structures are unremarkable. IMPRESSION: 1. No acute cardiopulmonary abnormality. Electronically Signed   By: Kerby Moors M.D.   On: 06/20/2019 08:36     ASSESSMENT AND PLAN:   23 year old female gravida 1  para 1 status post vaginal delivery on August 11 requiring induction for preeclampsia who subsequently developed tachycardia and acute blood loss anemia.   1.  Sepsis: With tachycardia and fever due to severe Pyelonephritis of the RIGHT kidneywith edema throughout the RIGHT renal cortex and prominent perinephric fluid/inflammation. Continue broad-spectrum antibiotics. So far blood and urine cultures are essentially unremarkable. As per GYN patient may have septic thrombophlebitis and was started on full dose of Lovenox. Procalcitonin level trending downwards She will need 2 weeks of antibiotics for pyelonephritis. She should have a urology follow-up for her kidney stones. Septic thrombophlebitis management as per GYN   2.  Acute blood loss anemia status post vaginal delivery on August 11: Status post 2 unit PRBC Transfuse if less than 7 Hemoglobin 7.7  3.  Hypo-magnesium: This was repleted Management plans discussed with the patient and she is in agreement.  CODE STATUS: full  TOTAL TIME TAKING CARE OF THIS PATIENT: 29 minutes.   I will sign off.  Please call me if you have further questions.  POSSIBLE D/C 1-2 days, DEPENDING ON CLINICAL CONDITION.   Bettey Costa M.D on 06/21/2019 at 10:52 AM  Between 7am to 6pm - Pager - [phone] After 6pm go to [email or web address] - password [email or web address] [facility]  Office  [phone]  CC: Primary care physician; Olin Hauser, DO  Note: This dictation was prepared with Dragon dictation along with smaller phrase technology. Any transcriptional errors that result from this process are  unintentional.

## 2019-06-21 NOTE — Progress Notes (Signed)
CBC reported to Dr. Loletha Grayer. Ward and order to give Lovenox as ordered received.

## 2019-06-22 LAB — COMPREHENSIVE METABOLIC PANEL
ALT: 24 U/L (ref 0–44)
AST: 29 U/L (ref 15–41)
Albumin: 2 g/dL — ABNORMAL LOW (ref 3.5–5.0)
Alkaline Phosphatase: 140 U/L — ABNORMAL HIGH (ref 38–126)
Anion gap: 13 (ref 5–15)
BUN: 10 mg/dL (ref 6–20)
CO2: 25 mmol/L (ref 22–32)
Calcium: 8 mg/dL — ABNORMAL LOW (ref 8.9–10.3)
Chloride: 98 mmol/L (ref 98–111)
Creatinine, Ser: 0.55 mg/dL (ref 0.44–1.00)
GFR calc Af Amer: >60 mL/min (ref >60)
GFR calc non Af Amer: >60 mL/min (ref >60)
Glucose, Bld: 89 mg/dL (ref 70–99)
Potassium: 3.5 mmol/L (ref 3.5–5.1)
Sodium: 136 mmol/L (ref 135–145)
Total Bilirubin: 0.2 mg/dL — ABNORMAL LOW (ref 0.3–1.2)
Total Protein: 5.2 g/dL — ABNORMAL LOW (ref 6.5–8.1)

## 2019-06-22 MED ORDER — SODIUM CHLORIDE 0.9 % IV SOLN
2.0000 g | INTRAVENOUS | Status: DC
  Administered 2019-06-22 – 2019-06-23 (×2): 2 g via INTRAVENOUS
  Filled 2019-06-22 (×3): qty 20

## 2019-06-22 MED ORDER — SODIUM CHLORIDE 0.9 % IV SOLN
INTRAVENOUS | Status: DC
  Administered 2019-06-22 (×2): via INTRAVENOUS

## 2019-06-22 MED ORDER — METRONIDAZOLE IN NACL 5-0.79 MG/ML-% IV SOLN
500.0000 mg | Freq: Three times a day (TID) | INTRAVENOUS | Status: DC
  Administered 2019-06-22 – 2019-06-23 (×5): 500 mg via INTRAVENOUS
  Filled 2019-06-22 (×8): qty 100

## 2019-06-22 NOTE — Progress Notes (Signed)
Obstetric and Gynecology Name: Patricia Pearson MRN:   [id number] DOB:   [birth year]           ADMISSION DATE:  06/18/2019 Progress note DATE: 06/22/2019  CHIEF COMPLAINT: Fever  BRIEF PATIENT DESCRIPTION:  23 yo female G1P1 s/p vaginal delivery 06/16/2019 with induction of labor for suspected atypical pre-eclampsia with sinus tach, unremitting headache and new-onset significant proteinuria, admitted to mother baby unit 08/13 with fevers secondary to presumed UTI with intermittently worsening tachycardia and acute blood loss anemia requiring transfer to the stepdown unit. She has now returned to floor feeling much improved.  SIGNIFICANT LABS/STUDIES:  Blood cultures: Neg x4 days Urine cultures: Neg x4 days Covid 19- neg on admission  Echo: EF 55% CT abd/pelvis w/ contrast 8/14: RIGHT kidney edema, inflammation. 2mm RIGHT renal stone CT angio for PE 8/14: Neg for PE, mild pleural effusions and atelectasis Chest X-ray 8/15: Neg Chest X-ray 8/13: Neg   CONSULTING SERVICES: Urology Acute care hospitalist Internal medicine Lactation Cardiology  Subjective   She feels so much better. Last episode of shaking/shivering yesterday. Tolerating regular diet and ambulating well.  -s/p meropenem, now returned to ceftriaxone and flagyl, which would cover pyleonephritis and endometritis -full dose anticoagulation Lovenox BID 1mg /kg (75mg ) for presumed septic thrombophlebitis started evening of 8/15  -last fever yesterday 8/16 101.1, Tmax 103.   -sustained tachycardia starting to resolve, now 18 hrs of normal heartrate  -s/p 2u PRBC for symptomatic anemia -s/p manual evacuation of uterine clots  -s/p CT CAP with no PE, and concern for pyelonephritis (right), and non-obstructing kidney stone.  Patient was supposed to see urology as outpatient after delivery for stent placement due to stone.  They do not think stone is large enough for stent while inpatient, but recommend outpatient f/u  for hx of multiple stones.   Objective  Objective:   Vitals:   06/22/19 0700 06/22/19 0750 06/22/19 0900 06/22/19 0927  BP:  138/90    Pulse: 79 92 76   Resp:  18    Temp:  100 F (37.8 C)  98.8 F (37.1 C)  TempSrc:  Oral  Oral  SpO2: 95% 97% 98%   Weight:      Height:        Intake/Output Summary (Last 24 hours) at 06/22/2019 1055 Last data filed at 06/22/2019 0941 Gross per 24 hour  Intake 1211.93 ml  Output 3200 ml  Net -1988.07 ml    General: NAD Cardiovascular: tachycardic, reg rate, no murmurs Pulmonary: CTAB, normal respiratory effort Abdomen: Benign. Non-tender, +BS, no guarding.  Extremities: No erythema or cords, no calf tenderness, with normal peripheral pulses. Back; 10cm diameter area of swelling mid-back, midline at epidural site.  Non-tender, marked.  Kernig, Brudzinski signs negative.  Neg neck stiffness.   Labs: Results for orders placed or performed during the hospital encounter of 06/18/19 (from the past 24 hour(s))  CBC with Differential/Platelet     Status: Abnormal   Collection Time: 06/21/19  9:02 PM  Result Value Ref Range   WBC 7.4 4.0 - 10.5 K/uL   RBC 2.75 (L) 3.87 - 5.11 MIL/uL   Hemoglobin 7.9 (L) 12.0 - 15.0 g/dL   HCT 23.9 (L) 36.0 - 46.0 %   MCV 86.9 80.0 - 100.0 fL   MCH 28.7 26.0 - 34.0 pg   MCHC 33.1 30.0 - 36.0 g/dL   RDW 13.9 11.5 - 15.5 %   Platelets 164 150 - 400 K/uL   nRBC 0.0 0.0 - 0.2 %  Neutrophils Relative % 76 %   Neutro Abs 5.7 1.7 - 7.7 K/uL   Lymphocytes Relative 15 %   Lymphs Abs 1.1 0.7 - 4.0 K/uL   Monocytes Relative 6 %   Monocytes Absolute 0.4 0.1 - 1.0 K/uL   Eosinophils Relative 1 %   Eosinophils Absolute 0.1 0.0 - 0.5 K/uL   Basophils Relative 0 %   Basophils Absolute 0.0 0.0 - 0.1 K/uL   Immature Granulocytes 2 %   Abs Immature Granulocytes 0.12 (H) 0.00 - 0.07 K/uL  Comprehensive metabolic panel     Status: Abnormal   Collection Time: 06/22/19  1:18 AM  Result Value Ref Range   Sodium 136 135  - 145 mmol/L   Potassium 3.5 3.5 - 5.1 mmol/L   Chloride 98 98 - 111 mmol/L   CO2 25 22 - 32 mmol/L   Glucose, Bld 89 70 - 99 mg/dL   BUN 10 6 - 20 mg/dL   Creatinine, Ser 0.55 0.44 - 1.00 mg/dL   Calcium 8.0 (L) 8.9 - 10.3 mg/dL   Total Protein 5.2 (L) 6.5 - 8.1 g/dL   Albumin 2.0 (L) 3.5 - 5.0 g/dL   AST 29 15 - 41 U/L   ALT 24 0 - 44 U/L   Alkaline Phosphatase 140 (H) 38 - 126 U/L   Total Bilirubin 0.2 (L) 0.3 - 1.2 mg/dL   GFR calc non Af Amer >60 >60 mL/min   GFR calc Af Amer >60 >60 mL/min   Anion gap 13 5 - 15    Cultures: Results for orders placed or performed during the hospital encounter of 06/18/19  Culture, blood (routine x 2)     Status: None (Preliminary result)   Collection Time: 06/18/19  6:35 PM   Specimen: BLOOD  Result Value Ref Range Status   Specimen Description BLOOD RIGHT ANTECUBITAL  Final   Special Requests   Final    BOTTLES DRAWN AEROBIC AND ANAEROBIC Blood Culture adequate volume   Culture   Final    NO GROWTH 4 DAYS Performed at [facility], [address] [place], [place] [address]    Report Status PENDING  Incomplete  Culture, blood (routine x 2)     Status: None (Preliminary result)   Collection Time: 06/18/19  6:35 PM   Specimen: BLOOD  Result Value Ref Range Status   Specimen Description BLOOD BLOOD LEFT WRIST  Final   Special Requests   Final    BOTTLES DRAWN AEROBIC AND ANAEROBIC Blood Culture adequate volume   Culture   Final    NO GROWTH 4 DAYS Performed at [facility], [address], [place], [place] [address]    Report Status PENDING  Incomplete  Urine Culture     Status: Abnormal   Collection Time: 06/18/19  6:35 PM   Specimen: Urine, Random  Result Value Ref Range Status   Specimen Description   Final    URINE, RANDOM Performed at [facility], [address] [place], [place] [address]    Special Requests   Final    NONE Performed at [facility], [address]  [place], [place] [address]    Culture (A)  Final    <10,000 COLONIES/mL INSIGNIFICANT GROWTH Performed at [facility], [address], [place], [place] [address]    Report Status 06/20/2019 FINAL  Final  SARS Coronavirus 2 [facility] order, Performed in [facility] hospital lab) Nasopharyngeal Nasopharyngeal Swab     Status:  None   Collection Time: 06/18/19  7:18 PM   Specimen: Nasopharyngeal Swab  Result Value Ref Range Status   SARS Coronavirus 2 NEGATIVE NEGATIVE Final    Comment: (NOTE) If result is NEGATIVE SARS-CoV-2 target nucleic acids are NOT DETECTED. The SARS-CoV-2 RNA is generally detectable in upper and lower  respiratory specimens during the acute phase of infection. The lowest  concentration of SARS-CoV-2 viral copies this assay can detect is 250  copies / mL. A negative result does not preclude SARS-CoV-2 infection  and should not be used as the sole basis for treatment or other  patient management decisions.  A negative result may occur with  improper specimen collection / handling, submission of specimen other  than nasopharyngeal swab, presence of viral mutation(s) within the  areas targeted by this assay, and inadequate number of viral copies  (<250 copies / mL). A negative result must be combined with clinical  observations, patient history, and epidemiological information. If result is POSITIVE SARS-CoV-2 target nucleic acids are DETECTED. The SARS-CoV-2 RNA is generally detectable in upper and lower  respiratory specimens dur ing the acute phase of infection.  Positive  results are indicative of active infection with SARS-CoV-2.  Clinical  correlation with patient history and other diagnostic information is  necessary to determine patient infection status.  Positive results do  not rule out bacterial infection or co-infection with other viruses. If result is PRESUMPTIVE POSTIVE SARS-CoV-2 nucleic acids MAY BE PRESENT.   A presumptive positive result was  obtained on the submitted specimen  and confirmed on repeat testing.  While 2019 novel coronavirus  (SARS-CoV-2) nucleic acids may be present in the submitted sample  additional confirmatory testing may be necessary for epidemiological  and / or clinical management purposes  to differentiate between  SARS-CoV-2 and other Sarbecovirus currently known to infect humans.  If clinically indicated additional testing with an alternate test  methodology [phone]) is advised. The SARS-CoV-2 RNA is generally  detectable in upper and lower respiratory sp ecimens during the acute  phase of infection. The expected result is Negative. Fact Sheet for Patients:  StrictlyIdeas.no Fact Sheet for Healthcare Providers: BankingDealers.co.za This test is not yet approved or cleared by the [place] FDA and has been authorized for detection and/or diagnosis of SARS-CoV-2 by FDA under an Emergency Use Authorization (EUA).  This EUA will remain in effect (meaning this test can be used) for the duration of the COVID-19 declaration under Section 564(b)(1) of the Act, 21 U.S.C. section 360bbb-3(b)(1), unless the authorization is terminated or revoked sooner. Performed at [facility], [address], [place], [place] [address]     Imaging: Ct Angio Chest Pe W Or Wo Contrast  Result Date: 06/19/2019 CLINICAL DATA:  Chest tightness and fever. EXAM: CT ANGIOGRAPHY CHEST WITH CONTRAST TECHNIQUE: Multidetector CT imaging of the chest was performed using the standard protocol during bolus administration of intravenous contrast. Multiplanar CT image reconstructions and MIPs were obtained to evaluate the vascular anatomy. CONTRAST:  61mL OMNIPAQUE IOHEXOL 350 MG/ML SOLN COMPARISON:  None. FINDINGS: Cardiovascular: Some of the central and peripheral pulmonary artery branches are difficult to characterize due to mild patient motion artifact and blood flow artifact,  however, there is no convincing pulmonary embolism identified within the main, lobar or segmental pulmonary arteries bilaterally. No occlusive pulmonary embolus identified. No thoracic aortic aneurysm or evidence of aortic dissection. Heart size is upper normal. No pericardial effusion. Mediastinum/Nodes: No mass or enlarged lymph nodes seen within the mediastinum  or perihilar regions. Esophagus appears normal. Trachea and central bronchi are unremarkable. Lungs/Pleura: Mixed nodular and ground-glass consolidation within the RIGHT lower lobe and along the posterior margin of the RIGHT upper lobe. Some component may represent atelectasis. Small bilateral pleural effusions w, RIGHT slightly greater than LEFT. Upper Abdomen: Limited images of the upper abdomen are unremarkable. Musculoskeletal: No acute or suspicious osseous finding. Review of the MIP images confirms the above findings. IMPRESSION: 1. No pulmonary embolus seen, with mild study limitations detailed above. 2. Mixed nodular and ground-glass consolidations within the RIGHT lower lobe and along the posterior margin of the RIGHT upper lobe. Some component may represent atelectasis. Differential includes atypical pneumonias such as viral or fungal, interstitial pneumonias, edema related to volume overload/CHF, hypersensitivity pneumonitis, and respiratory bronchiolitis. 3. Small bilateral pleural effusions, RIGHT slightly greater than LEFT. 4. Heart size is upper normal. No pericardial effusion. Electronically Signed   By: Franki Cabot M.D.   On: 06/19/2019 16:18   Ct Abdomen Pelvis W Contrast  Result Date: 06/19/2019 CLINICAL DATA:  Chest tightness and fever. Recent vaginal delivery. Low back pain and headache. EXAM: CT ABDOMEN AND PELVIS WITH CONTRAST TECHNIQUE: Multidetector CT imaging of the abdomen and pelvis was performed using the standard protocol following bolus administration of intravenous contrast. CONTRAST:  2mL OMNIPAQUE IOHEXOL 350 MG/ML  SOLN COMPARISON:  None. FINDINGS: Lower chest: Small bilateral pleural effusions, with associated mild atelectasis. Hepatobiliary: No focal liver abnormality is seen. No gallstones, gallbladder wall thickening, or biliary dilatation. Pancreas: Unremarkable. No pancreatic ductal dilatation or surrounding inflammatory changes. Spleen: Normal in size without focal abnormality. Adrenals/Urinary Tract: Adrenal glands appear normal. Ill-defined edema throughout of the majority of the RIGHT renal cortex, indicating pyelonephritis. Associated perinephric fluid/inflammation. Questionable additional edema at the upper pole of the LEFT kidney, but not definitive. Mild bilateral hydronephrosis ww, likely physiologic related to the recently gravid uterus. 1 mm RIGHT renal stone Stomach/Bowel: z fairly large amount of stool within the rectosigmoid colon. Or proximal segments of the large bowel are normal in caliber. No small bowel dilatation. Stomach is unremarkable, partially decompressed. Appendix is normal. Vascular/Lymphatic: No significant vascular findings are present. No enlarged abdominal or pelvic lymph nodes. Reproductive: Enlarged uterus, compatible with the given history of recent pregnancy. Other: Small amount of free fluid in the pelvis. No abscess collection identified. No free intraperitoneal air. Musculoskeletal: No acute or suspicious osseous finding. IMPRESSION: 1. Pyelonephritis of the RIGHT kidney, of a fairly severe degree, with edema throughout the RIGHT renal cortex and prominent perinephric fluid/inflammation. Questionable additional pyelonephritis at the upper pole of the LEFT kidney, but not convincing. 2. Mild bilateral hydronephrosis, likely physiologic related to the recently gravid uterus. 3. 1 mm RIGHT renal stone. 4. Fairly large amount of stool within the rectosigmoid colon (constipation? ). 5. Small bilateral pleural effusions, with associated mild atelectasis. 6. No abscess collection  identified. However, would consider follow-up CT abdomen within 1 week to exclude early developing abscess within the RIGHT renal cortex. Electronically Signed   By: Franki Cabot M.D.   On: 06/19/2019 16:42   Dg Chest Port 1 View  Result Date: 06/20/2019 CLINICAL DATA:  Respiratory failure. EXAM: PORTABLE CHEST 1 VIEW COMPARISON:  06/19/2019 FINDINGS: Normal heart size. No pleural effusion or edema identified. No airspace consolidation identified. No atelectasis. The visualized osseous structures are unremarkable. IMPRESSION: 1. No acute cardiopulmonary abnormality. Electronically Signed   By: Kerby Moors M.D.   On: 06/20/2019 08:36   Dg Chest [facility]  Result Date: 06/18/2019 CLINICAL DATA:  Fever today. Recent postpartum post vaginal delivery. EXAM: PORTABLE CHEST 1 VIEW COMPARISON:  None. FINDINGS: The cardiomediastinal contours are normal. Scattered subsegmental atelectasis. No confluent airspace disease. Pulmonary vasculature is normal. No large pleural effusion or pneumothorax. No acute osseous abnormalities are seen. IMPRESSION: Scattered subsegmental atelectasis. Electronically Signed   By: Keith Rake M.D.   On: 06/18/2019 18:59     Assessment   22 y.o. [facility] Day: 5   Plan   1. Postpartum:  -lochia is normal and blood loss is as expected.  She should have no more significat losses after evacuation of blood clots.    -s/p lactation visit to help milk dry up  -fundus below umbilicus  2. Fever:  -negative cultures both urine and blood  -spiking in spite of antibiotics  -continue ceftriaxone 1g q24hrs and metronidazole 500mg  q8hrs. Plan to discontinue when no fever x48hrs and clinical improvement.   -diagnosis of exclusion: septic thrombophlebitis, treatment is anti-coagulation. She has been on LOVENOX 1mg /kg BID since 8/15 evening. Ddx strongly suspected if her fevers resolve within 48hrs of anticoagulation. Would continue LMWH x48hrs after fever resolution.     3. Kidney stone:  -still present, and was advised to see urology post-delivery for stent. However, urology suggests outpatient work up for multiple stones but no need for stent while inpatient for 62mm stone.  4. Continue reg diet, IV fluids with Na, K, Ca, Mg, Phos repletion PRN, OOB as often as possible

## 2019-06-22 NOTE — Progress Notes (Signed)
Pt. feeling much better today. Pt has had no fever over 100 during my shift and has had no shaking/shivering episodes today. Pt states, "I almost feel normal again finally." Pt. up and walking in the hallways and tolerating well. Will continue to monitor.

## 2019-06-22 NOTE — Lactation Note (Signed)
Lactation Consultation Note  Patient Name: Patricia Pearson Date: 06/22/2019  Mom's right breast slightly firm, warm to touch and uncomfortable but mom reports feeling much better than yesterday.  Left breast has some filling in ducts and warm to touch but not too uncomfortable.  Mom reports left breast will still occasionally leak.  No trauma, just slight tenderness, voiced to nipples on both breasts.  Comfort gels placed on nipples.  Cool cabbage leaves applied to breasts.  Edema and discomfort decreased after application.  Mom reports that Patricia Pearson is still tolerating formula well and is taking 1 1/2 to 2 ounces about every 2 hours and has only lost 3% of birth weight.  He has another follow up pediatric appointment today.  Lactation name and number written on white board and encouraged to call with any questions, concerns or assistance.  Maternal Data    [facility] Score                   Interventions    Lactation Tools Discussed/Used     Consult Status      Jarold Motto 06/22/2019, 12:50 PM

## 2019-06-22 NOTE — Progress Notes (Signed)
Urology Inpatient Progress Note  Subjective: Patricia Pearson is a 23 y.o. female who was admitted 2 days postpartum on 06/18/2019 with bilateral pyelonephritis with mild bilateral hydronephrosis and a 96mm right renal stone.  She was started on IV antibiotics but has had refractory afternoon fevers and rigors since admission.  White count downtrending; most recently 7.4 yesterday.  Creatinine stable at 0.55.  Patient has been afebrile since 2029 last night.  She has had some intermittent diastolic hypertension to the 90s.  Urine culture with <10K colonies/mL insignificant growth. Blood cultures with no growth at 4 days.  Today, she reports improvement in severity and duration of yesterday's rigors. She denies nausea or vomiting.  She did note clear yellow urine yesterday.  Overall, she states she feels somewhat better since yesterday.  Anti-infectives: Anti-infectives (From admission, onward)   Start     Dose/Rate Route Frequency Ordered Stop   06/22/19 0200  cefTRIAXone (ROCEPHIN) 2 g in sodium chloride 0.9 % 100 mL IVPB     2 g 200 mL/hr over 30 Minutes Intravenous Every 24 hours 06/22/19 0046     06/22/19 0200  metroNIDAZOLE (FLAGYL) IVPB 500 mg     500 mg 100 mL/hr over 60 Minutes Intravenous Every 8 hours 06/22/19 0046     06/19/19 2200  meropenem (MERREM) 2 g in sodium chloride 0.9 % 100 mL IVPB  Status:  Discontinued     2 g 200 mL/hr over 30 Minutes Intravenous Every 8 hours 06/19/19 1809 06/19/19 1811   06/19/19 1815  meropenem (MERREM) 1 g in sodium chloride 0.9 % 100 mL IVPB  Status:  Discontinued     1 g 200 mL/hr over 30 Minutes Intravenous Every 8 hours 06/19/19 1811 06/22/19 0046   06/19/19 1200  cefTRIAXone (ROCEPHIN) 1 g in sodium chloride 0.9 % 100 mL IVPB  Status:  Discontinued     1 g 200 mL/hr over 30 Minutes Intravenous Every 24 hours 06/19/19 1102 06/19/19 1731   06/19/19 0300  clindamycin (CLEOCIN) IVPB 900 mg  Status:  Discontinued     900 mg 100 mL/hr over 30  Minutes Intravenous Every 8 hours 06/18/19 2300 06/19/19 1102   06/18/19 2200  gentamicin (GARAMYCIN) 380 mg in dextrose 5 % 100 mL IVPB  Status:  Discontinued     5 mg/kg  75.3 kg 109.5 mL/hr over 60 Minutes Intravenous Every 24 hours 06/18/19 1953 06/19/19 1102   06/18/19 2200  clindamycin (CLEOCIN) IVPB 900 mg  Status:  Discontinued     900 mg 100 mL/hr over 30 Minutes Intravenous Every 8 hours 06/18/19 1953 06/18/19 2300   06/18/19 1830  clindamycin (CLEOCIN) IVPB 900 mg  Status:  Discontinued     900 mg 100 mL/hr over 30 Minutes Intravenous  Once 06/18/19 1827 06/18/19 1930   06/18/19 1830  gentamicin (GARAMYCIN) 110 mg in dextrose 5 % 50 mL IVPB  Status:  Discontinued     1.5 mg/kg  75.3 kg 105.5 mL/hr over 30 Minutes Intravenous  Once 06/18/19 1827 06/18/19 1924      Current Facility-Administered Medications  Medication Dose Route Frequency Provider Last Rate Last Dose  . 0.9 %  sodium chloride infusion   Intravenous Continuous Ward, Honor Loh, MD 10 mL/hr at 06/22/19 0418    . acetaminophen (TYLENOL) tablet 1,000 mg  1,000 mg Oral Q6H Schermerhorn, Gwen Her, MD   1,000 mg at 06/22/19 0926  . benzocaine-Menthol (DERMOPLAST) 20-0.5 % topical spray 1 application  1 application Topical TID  PRN Schermerhorn, Gwen Her, MD      . calcium carbonate (TUMS - dosed in mg elemental calcium) chewable tablet 400 mg of elemental calcium  2 tablet Oral Q4H PRN Schermerhorn, Gwen Her, MD   400 mg of elemental calcium at 06/19/19 0245  . cefTRIAXone (ROCEPHIN) 2 g in sodium chloride 0.9 % 100 mL IVPB  2 g Intravenous Q24H Ward, Honor Loh, MD   Stopped at 06/22/19 0209  . docusate sodium (COLACE) capsule 100 mg  100 mg Oral Daily Schermerhorn, Gwen Her, MD   100 mg at 06/21/19 0816  . docusate sodium (COLACE) capsule 100 mg  100 mg Oral BID PRN Schermerhorn, Gwen Her, MD      . enoxaparin (LOVENOX) injection 75 mg  1 mg/kg Subcutaneous Q12H Schermerhorn, Gwen Her, MD   75 mg at 06/22/19 0926  .  ferrous sulfate tablet 325 mg  325 mg Oral BID WC Lisette Grinder, CNM   325 mg at 06/22/19 3846  . ibuprofen (ADVIL) tablet 600 mg  600 mg Oral Q6H PRN Schermerhorn, Gwen Her, MD   600 mg at 06/22/19 0813  . metroNIDAZOLE (FLAGYL) IVPB 500 mg  500 mg Intravenous Q8H Ward, Chelsea C, MD 100 mL/hr at 06/22/19 0929 500 mg at 06/22/19 0929  . ondansetron (ZOFRAN) injection 4 mg  4 mg Intravenous Q6H PRN Wilhelmina Mcardle, MD   4 mg at 06/19/19 1333  . prenatal multivitamin tablet 1 tablet  1 tablet Oral Q1200 Schermerhorn, Gwen Her, MD   1 tablet at 06/22/19 1136  . sodium chloride 0.9 % bolus 1,000 mL  1,000 mL Intravenous Once Lilia Pro., MD      . zolpidem [facility]) tablet 5 mg  5 mg Oral QHS PRN Schermerhorn, Gwen Her, MD         Objective: Vital signs in last 24 hours: Temp:  [98.2 F (36.8 C)-102.8 F (39.3 C)] 98.5 F (36.9 C) (08/17 1144) Pulse Rate:  [54-92] 57 (08/17 1144) Resp:  [18-22] 20 (08/17 1144) BP: (108-138)/(73-96) 119/79 (08/17 1144) SpO2:  [95 %-100 %] 100 % (08/17 1144)  Intake/Output from previous day: 08/16 0701 - 08/17 0700 In: 1125.4 [P.O.:640; I.V.:11.9; IV Piggyback:473.5] Out: 2700 [Urine:2700] Intake/Output this shift: Total I/O In: 360 [P.O.:360] Out: 500 [Urine:500]  Physical Exam Vitals signs and nursing note reviewed.  Constitutional:      General: She is not in acute distress.    Appearance: She is not ill-appearing, toxic-appearing or diaphoretic.  HENT:     Head: Normocephalic and atraumatic.  Pulmonary:     Effort: Pulmonary effort is normal. No respiratory distress.  Abdominal:     Palpations: Abdomen is soft.     Tenderness: There is no abdominal tenderness. There is no right CVA tenderness, left CVA tenderness, guarding or rebound.  Skin:    General: Skin is warm and dry.  Neurological:     Mental Status: She is alert and oriented to person, place, and time.  Psychiatric:        Mood and Affect: Mood normal.        Behavior:  Behavior normal.    Lab Results:  Recent Labs    06/21/19 0525 06/21/19 2102  WBC 8.0 7.4  HGB 7.7* 7.9*  HCT 24.4* 23.9*  PLT 147* 164   BMET Recent Labs    06/20/19 0509 06/22/19 0118  NA 136 136  K 4.0 3.5  CL 108 98  CO2 21* 25  GLUCOSE 91 89  BUN 10 10  CREATININE 0.48 0.55  CALCIUM 8.2* 8.0*   PT/INR Recent Labs    06/19/19 1504  LABPROT 15.3*  INR 1.2   Studies/Results: No results found.  Assessment & Plan: Patient with continued afternoon fevers and rigors despite broad-spectrum antibiotic therapy, however the duration and intensity of these seem to be decreasing with time.  Per note by primary team, concern for septic thrombophlebitis as the cause.  No repeat UA or culture available.  I cannot rule out refractory pyelonephritis as contributing to this phenomenon at this time, however I am reassured to find that she is nontoxic-appearing and without CVA tenderness on physical exam today.  I would recommend a renal ultrasound at this time to evaluate for resolution of bilateral hydronephrosis as well as possible interval development of renal abscess.  No other recommendations at this time. -Renal US  Thank you for involving me in the care of this patient, I will continue to follow along.  Debroah Loop, PA-C 06/22/2019

## 2019-06-22 NOTE — Progress Notes (Addendum)
Summary of day's events   Obstetric and Gynecology  HD # 4  Subjective   Patient feels much better from admission.  The shaking chills she experiences around 2pm every day is less in intensity and in duration.  Today she said was even tolerable.   She has left sided headache and when she has her episodes she has left sided upper extremity numbness and tingling.  She says her arm just goes dead.  She feels tingling in her whole body.  In between these episodes she is tired but otherwise ok.    Today: -transferred out of ICU back to 3rd floor. Spoke to ICU attending regarding her status. -becomes hot and flushed after Meropenem admin - adverse reaction reported to pharmacy, and they recommend changing to rocephin and flagyl, as well as repeat urine culture. -decided no longer breastfeeding or pumping -started on lovenox last night.  Reported bloody nose today x1 episode, platelets checked and improving.  -urine culture returned 10,000 insignificant growth -blood cultures negative  -urology consult for both pyelo and she was told to have follow up with urology after delivery for stent placement due to right stone.  Urologist says stent is not necessary and to f/u with Urology to detemrine etiology of stones.  -tolerating reg diet, voiding spontaneously, minimal pain, minimal vaginal bleeding,  -says lump I back has gone away.   -Dr. Benjie Karvonen (IM) saw patient and has signed off.  8/15: -seen by anesthesia who did not think the swelling on her back was anything to worry about.  No note in chart.  -seen by cardiology, EF 55% with non-clinical mitral valve regurgitation.   -continued on meropenem in spite of negative urine and blood cultures -continues to have fever spikes, Tmax 103@ 1415. -sustained tachycardia -s/p 2u PRBC for symptomatic anemia -s/p manual evacuation of uterine clots  -s/p CT CAP with no PE, and concern for pyelonephritis (right), and non-obstructing kidney stone.  Patient was  supposed to see urology as outpatient after delivery for stent placement due to stone.     Objective  Objective:   Vitals:   06/21/19 2029 06/21/19 2100 06/21/19 2300 06/21/19 2321  BP: (!) 126/96   108/73  Pulse: 76 70 74 81  Resp: 18   20  Temp: 98.8 F (37.1 C)   98.2 F (36.8 C)  TempSrc: Oral   Oral  SpO2: 98% 99% 97% 100%  Weight:      Height:        Intake/Output Summary (Last 24 hours) at 06/22/2019 0049 Last data filed at 06/21/2019 2229 Gross per 24 hour  Intake 1153.45 ml  Output 1300 ml  Net -146.55 ml    General: NAD Cardiovascular: tachycardic, reg rate, no murmurs Pulmonary: CTAB, normal respiratory effort Abdomen: Benign. Non-tender, +BS, no guarding.  Extremities: No erythema or cords, no calf tenderness, with normal peripheral pulses. Back; marked area has substantially decreased in size. No evidence today. Kernig, Brudzinski signs negative.  Neg neck stiffness.   Labs: Results for orders placed or performed during the hospital encounter of 06/18/19 (from the past 24 hour(s))  Procalcitonin     Status: None   Collection Time: 06/21/19  5:25 AM  Result Value Ref Range   Procalcitonin 23.02 ng/mL  CBC     Status: Abnormal   Collection Time: 06/21/19  5:25 AM  Result Value Ref Range   WBC 8.0 4.0 - 10.5 K/uL   RBC 2.75 (L) 3.87 - 5.11 MIL/uL   Hemoglobin 7.7 (  L) 12.0 - 15.0 g/dL   HCT 24.4 (L) 36.0 - 46.0 %   MCV 88.7 80.0 - 100.0 fL   MCH 28.0 26.0 - 34.0 pg   MCHC 31.6 30.0 - 36.0 g/dL   RDW 14.0 11.5 - 15.5 %   Platelets 147 (L) 150 - 400 K/uL   nRBC 0.0 0.0 - 0.2 %  Magnesium     Status: Abnormal   Collection Time: 06/21/19  5:25 AM  Result Value Ref Range   Magnesium 1.4 (L) 1.7 - 2.4 mg/dL  Phosphorus     Status: None   Collection Time: 06/21/19  5:25 AM  Result Value Ref Range   Phosphorus 4.1 2.5 - 4.6 mg/dL  CBC with Differential/Platelet     Status: Abnormal   Collection Time: 06/21/19  9:02 PM  Result Value Ref Range   WBC 7.4  4.0 - 10.5 K/uL   RBC 2.75 (L) 3.87 - 5.11 MIL/uL   Hemoglobin 7.9 (L) 12.0 - 15.0 g/dL   HCT 23.9 (L) 36.0 - 46.0 %   MCV 86.9 80.0 - 100.0 fL   MCH 28.7 26.0 - 34.0 pg   MCHC 33.1 30.0 - 36.0 g/dL   RDW 13.9 11.5 - 15.5 %   Platelets 164 150 - 400 K/uL   nRBC 0.0 0.0 - 0.2 %   Neutrophils Relative % 76 %   Neutro Abs 5.7 1.7 - 7.7 K/uL   Lymphocytes Relative 15 %   Lymphs Abs 1.1 0.7 - 4.0 K/uL   Monocytes Relative 6 %   Monocytes Absolute 0.4 0.1 - 1.0 K/uL   Eosinophils Relative 1 %   Eosinophils Absolute 0.1 0.0 - 0.5 K/uL   Basophils Relative 0 %   Basophils Absolute 0.0 0.0 - 0.1 K/uL   Immature Granulocytes 2 %   Abs Immature Granulocytes 0.12 (H) 0.00 - 0.07 K/uL    Cultures: Results for orders placed or performed during the hospital encounter of 06/18/19  Culture, blood (routine x 2)     Status: None (Preliminary result)   Collection Time: 06/18/19  6:35 PM   Specimen: BLOOD  Result Value Ref Range Status   Specimen Description BLOOD RIGHT ANTECUBITAL  Final   Special Requests   Final    BOTTLES DRAWN AEROBIC AND ANAEROBIC Blood Culture adequate volume   Culture   Final    NO GROWTH 3 DAYS Performed at [facility], [address] [place], [place] [address]    Report Status PENDING  Incomplete  Culture, blood (routine x 2)     Status: None (Preliminary result)   Collection Time: 06/18/19  6:35 PM   Specimen: BLOOD  Result Value Ref Range Status   Specimen Description BLOOD BLOOD LEFT WRIST  Final   Special Requests   Final    BOTTLES DRAWN AEROBIC AND ANAEROBIC Blood Culture adequate volume   Culture   Final    NO GROWTH 3 DAYS Performed at [facility], [address], [place], [place] [address]    Report Status PENDING  Incomplete  Urine Culture     Status: Abnormal   Collection Time: 06/18/19  6:35 PM   Specimen: Urine, Random  Result Value Ref Range Status   Specimen Description   Final    URINE, RANDOM Performed at [facility], [address], [place], [place] [address]    Special Requests   Final    NONE Performed at [facility], [address] [place],  [place] [address]    Culture (A)  Final    <10,000 COLONIES/mL INSIGNIFICANT GROWTH Performed at [facility], [place] [address], [place], [place] [address]    Report Status 06/20/2019 FINAL  Final  SARS Coronavirus 2 [facility] order, Performed in [facility] hospital lab) Nasopharyngeal Nasopharyngeal Swab     Status: None   Collection Time: 06/18/19  7:18 PM   Specimen: Nasopharyngeal Swab  Result Value Ref Range Status   SARS Coronavirus 2 NEGATIVE NEGATIVE Final    Comment: (NOTE) If result is NEGATIVE SARS-CoV-2 target nucleic acids are NOT DETECTED. The SARS-CoV-2 RNA is generally detectable in upper and lower  respiratory specimens during the acute phase of infection. The lowest  concentration of SARS-CoV-2 viral copies this assay can detect is 250  copies / mL. A negative result does not preclude SARS-CoV-2 infection  and should not be used as the sole basis for treatment or other  patient management decisions.  A negative result may occur with  improper specimen collection / handling, submission of specimen other  than nasopharyngeal swab, presence of viral mutation(s) within the  areas targeted by this assay, and inadequate number of viral copies  (<250 copies / mL). A negative result must be combined with clinical  observations, patient history, and epidemiological information. If result is POSITIVE SARS-CoV-2 target nucleic acids are DETECTED. The SARS-CoV-2 RNA is generally detectable in upper and lower  respiratory specimens dur ing the acute phase of infection.  Positive  results are indicative of active infection with SARS-CoV-2.  Clinical  correlation with patient history and other diagnostic information is  necessary to determine patient infection status.  Positive results do  not rule out bacterial  infection or co-infection with other viruses. If result is PRESUMPTIVE POSTIVE SARS-CoV-2 nucleic acids MAY BE PRESENT.   A presumptive positive result was obtained on the submitted specimen  and confirmed on repeat testing.  While 2019 novel coronavirus  (SARS-CoV-2) nucleic acids may be present in the submitted sample  additional confirmatory testing may be necessary for epidemiological  and / or clinical management purposes  to differentiate between  SARS-CoV-2 and other Sarbecovirus currently known to infect humans.  If clinically indicated additional testing with an alternate test  methodology [phone]) is advised. The SARS-CoV-2 RNA is generally  detectable in upper and lower respiratory sp ecimens during the acute  phase of infection. The expected result is Negative. Fact Sheet for Patients:  StrictlyIdeas.no Fact Sheet for Healthcare Providers: BankingDealers.co.za This test is not yet approved or cleared by the [place] FDA and has been authorized for detection and/or diagnosis of SARS-CoV-2 by FDA under an Emergency Use Authorization (EUA).  This EUA will remain in effect (meaning this test can be used) for the duration of the COVID-19 declaration under Section 564(b)(1) of the Act, 21 U.S.C. section 360bbb-3(b)(1), unless the authorization is terminated or revoked sooner. Performed at [facility], [address], [place], [place] [address]     Imaging: Ct Angio Chest Pe W Or Wo Contrast  Result Date: 06/19/2019 CLINICAL DATA:  Chest tightness and fever. EXAM: CT ANGIOGRAPHY CHEST WITH CONTRAST TECHNIQUE: Multidetector CT imaging of the chest was performed using the standard protocol during bolus administration of intravenous contrast. Multiplanar CT image reconstructions and MIPs were obtained to evaluate the vascular anatomy. CONTRAST:  78mL OMNIPAQUE IOHEXOL 350 MG/ML SOLN COMPARISON:  None. FINDINGS:  Cardiovascular: Some of the central and peripheral pulmonary artery branches are difficult to characterize due to  mild patient motion artifact and blood flow artifact, however, there is no convincing pulmonary embolism identified within the main, lobar or segmental pulmonary arteries bilaterally. No occlusive pulmonary embolus identified. No thoracic aortic aneurysm or evidence of aortic dissection. Heart size is upper normal. No pericardial effusion. Mediastinum/Nodes: No mass or enlarged lymph nodes seen within the mediastinum or perihilar regions. Esophagus appears normal. Trachea and central bronchi are unremarkable. Lungs/Pleura: Mixed nodular and ground-glass consolidation within the RIGHT lower lobe and along the posterior margin of the RIGHT upper lobe. Some component may represent atelectasis. Small bilateral pleural effusions w, RIGHT slightly greater than LEFT. Upper Abdomen: Limited images of the upper abdomen are unremarkable. Musculoskeletal: No acute or suspicious osseous finding. Review of the MIP images confirms the above findings. IMPRESSION: 1. No pulmonary embolus seen, with mild study limitations detailed above. 2. Mixed nodular and ground-glass consolidations within the RIGHT lower lobe and along the posterior margin of the RIGHT upper lobe. Some component may represent atelectasis. Differential includes atypical pneumonias such as viral or fungal, interstitial pneumonias, edema related to volume overload/CHF, hypersensitivity pneumonitis, and respiratory bronchiolitis. 3. Small bilateral pleural effusions, RIGHT slightly greater than LEFT. 4. Heart size is upper normal. No pericardial effusion. Electronically Signed   By: Franki Cabot M.D.   On: 06/19/2019 16:18   Ct Abdomen Pelvis W Contrast  Result Date: 06/19/2019 CLINICAL DATA:  Chest tightness and fever. Recent vaginal delivery. Low back pain and headache. EXAM: CT ABDOMEN AND PELVIS WITH CONTRAST TECHNIQUE: Multidetector CT imaging  of the abdomen and pelvis was performed using the standard protocol following bolus administration of intravenous contrast. CONTRAST:  81mL OMNIPAQUE IOHEXOL 350 MG/ML SOLN COMPARISON:  None. FINDINGS: Lower chest: Small bilateral pleural effusions, with associated mild atelectasis. Hepatobiliary: No focal liver abnormality is seen. No gallstones, gallbladder wall thickening, or biliary dilatation. Pancreas: Unremarkable. No pancreatic ductal dilatation or surrounding inflammatory changes. Spleen: Normal in size without focal abnormality. Adrenals/Urinary Tract: Adrenal glands appear normal. Ill-defined edema throughout of the majority of the RIGHT renal cortex, indicating pyelonephritis. Associated perinephric fluid/inflammation. Questionable additional edema at the upper pole of the LEFT kidney, but not definitive. Mild bilateral hydronephrosis ww, likely physiologic related to the recently gravid uterus. 1 mm RIGHT renal stone Stomach/Bowel: z fairly large amount of stool within the rectosigmoid colon. Or proximal segments of the large bowel are normal in caliber. No small bowel dilatation. Stomach is unremarkable, partially decompressed. Appendix is normal. Vascular/Lymphatic: No significant vascular findings are present. No enlarged abdominal or pelvic lymph nodes. Reproductive: Enlarged uterus, compatible with the given history of recent pregnancy. Other: Small amount of free fluid in the pelvis. No abscess collection identified. No free intraperitoneal air. Musculoskeletal: No acute or suspicious osseous finding. IMPRESSION: 1. Pyelonephritis of the RIGHT kidney, of a fairly severe degree, with edema throughout the RIGHT renal cortex and prominent perinephric fluid/inflammation. Questionable additional pyelonephritis at the upper pole of the LEFT kidney, but not convincing. 2. Mild bilateral hydronephrosis, likely physiologic related to the recently gravid uterus. 3. 1 mm RIGHT renal stone. 4. Fairly large  amount of stool within the rectosigmoid colon (constipation? ). 5. Small bilateral pleural effusions, with associated mild atelectasis. 6. No abscess collection identified. However, would consider follow-up CT abdomen within 1 week to exclude early developing abscess within the RIGHT renal cortex. Electronically Signed   By: Franki Cabot M.D.   On: 06/19/2019 16:42   Dg Chest Port 1 View  Result Date: 06/20/2019 CLINICAL DATA:  Respiratory failure.  EXAM: PORTABLE CHEST 1 VIEW COMPARISON:  06/19/2019 FINDINGS: Normal heart size. No pleural effusion or edema identified. No airspace consolidation identified. No atelectasis. The visualized osseous structures are unremarkable. IMPRESSION: 1. No acute cardiopulmonary abnormality. Electronically Signed   By: Kerby Moors M.D.   On: 06/20/2019 08:36   Dg Chest Port 1 View  Result Date: 06/18/2019 CLINICAL DATA:  Fever today. Recent postpartum post vaginal delivery. EXAM: PORTABLE CHEST 1 VIEW COMPARISON:  None. FINDINGS: The cardiomediastinal contours are normal. Scattered subsegmental atelectasis. No confluent airspace disease. Pulmonary vasculature is normal. No large pleural effusion or pneumothorax. No acute osseous abnormalities are seen. IMPRESSION: Scattered subsegmental atelectasis. Electronically Signed   By: Keith Rake M.D.   On: 06/18/2019 18:59     Assessment   22 y.o. [facility] Day: 5   Plan   1. Postpartum:  -lochia is normal an blood loss is as expected.  She should have no more significat losses after evacuation of blood clots.    -fundus below umbilicus  2. Fever:  -negative cultures both urine and blood, will repeat urine as pyelo still remains on differentil based on imaging results.  -spiking in spite of antibiotics  -likely septic thrombophlebitis, which is usually seen in setting of pelvic surgery, but can present after vaginal delivery. Information printed and given to CCU NP.  Treatment is  anti-coagulation  -continue LOVENOX 1mg /kg BID   3. Kidney stone:  -s/p urology consult, no stent indicated.  4.s/p  ICU admission - returned to floor, continue guarded observation  5. Continue reg diet, IV fluids with Na, K, Ca, Mg, Phos repletion PRN, OOB as often as possible  ----- Larey Days, MD, FACOG Attending Obstetrician and Gynecologist [facility], Department of OB/GYN [facility]  ;

## 2019-06-23 LAB — CULTURE, BLOOD (ROUTINE X 2)
Culture: NO GROWTH
Culture: NO GROWTH
Special Requests: ADEQUATE
Special Requests: ADEQUATE

## 2019-06-23 LAB — CBC
HCT: 30.6 % — ABNORMAL LOW (ref 36.0–46.0)
Hemoglobin: 10 g/dL — ABNORMAL LOW (ref 12.0–15.0)
MCH: 28.4 pg (ref 26.0–34.0)
MCHC: 32.7 g/dL (ref 30.0–36.0)
MCV: 86.9 fL (ref 80.0–100.0)
Platelets: 221 10*3/uL (ref 150–400)
RBC: 3.52 MIL/uL — ABNORMAL LOW (ref 3.87–5.11)
RDW: 13.5 % (ref 11.5–15.5)
WBC: 6.3 10*3/uL (ref 4.0–10.5)
nRBC: 0 % (ref 0.0–0.2)

## 2019-06-23 MED ORDER — ACETAMINOPHEN 500 MG PO TABS: 1000.0000 mg | ORAL_TABLET | Freq: Four times a day (QID) | ORAL | 0 refills | Status: DC | PRN

## 2019-06-23 MED ORDER — IBUPROFEN 600 MG PO TABS: 600.0000 mg | ORAL_TABLET | Freq: Four times a day (QID) | ORAL | 0 refills | Status: DC | PRN

## 2019-06-23 NOTE — Discharge Instructions (Signed)
After Your Delivery Discharge Instructions   Postpartum: Care Instructions  After childbirth (postpartum period), your body goes through many changes. Some of these changes happen over several weeks. In the hours after delivery, your body will begin to recover from childbirth while it prepares to breastfeed your newborn. You may feel emotional during this time. Your hormones can shift your mood without warning for no clear reason.  In the first couple of weeks after childbirth, many women have emotions that change from happy to sad. You may find it hard to sleep. You may cry a lot. This is called the "baby blues." These overwhelming emotions often go away within a couple of days or weeks. But it's important to discuss your feelings with your doctor.  You should call your care provider if you have unrelieved feelings of:  Inability to cope  Sadness  Anxiety  Lack of interest in baby  Insomnia  Crying  It is easy to get too tired and overwhelmed during the first weeks after childbirth. Don't try to do too much. Get rest whenever you can, accept help from others, and eat well and drink plenty of fluids.  About 4 to 6 weeks after your baby's birth, you will have a follow-up visit with your care provider. This visit is your time to talk to your provider about anything you are concerned or curious about.  Follow-up care is a key part of your treatment and safety. Be sure to make and go to all appointments, and call your doctor if you are having problems. It's also a good idea to know your test results and keep a list of the medicines you take.  How can you care for yourself at home?  Sleep or rest when your baby sleeps.  Get help with household chores from family or friends, if you can. Do not try to do it all yourself.  If you have hemorrhoids or swelling or pain around the opening of your vagina, try using cold and heat. You can put ice or a cold pack on the area for 10 to 20 minutes at  a time. Put a thin cloth between the ice and your skin. Also try sitting in a few inches of warm water (sitz bath) 3 times a day and after bowel movements.  Take pain medicines exactly as directed.  If the provider gave you a prescription medicine for pain, take it as prescribed.  If you do not have a prescription and need something over the counter, you can take:  Ibuprofen (Motrin, Advil) up to '600mg'$  every 6 hours as needed for pain  Acetaminophen (Tylenol) up to '650mg'$  every 4 hours as needed for pain  Some people find it helpful to alternate between these two medications.   No driving for 1-2 weeks or while taking pain medications.   Eat more fiber to avoid constipation. Include foods such as whole-grain breads and cereals, raw vegetables, raw and dried fruits, and beans.  Drink plenty of fluids, enough so that your urine is light yellow or clear like water. If you have kidney, heart, or liver disease and have to limit fluids, talk with your doctor before you increase the amount of fluids you drink.  Do not put anything in the vagina for 6 weeks. This means no sex, no tampons, no douching, and no enemas.  If you have stitches, keep the area clean by pouring or spraying warm water over the area outside your vagina and anus after you use the toilet.  No strenuous activity or heavy lifting for 6 weeks   No tub baths; showers only  Continue prenatal vitamin and iron.  If breastfeeding:  Increase calories and fluids while breastfeeding.  You may have a slight fever when your milk comes in, but it should go away on its own. If it does not, and rises above 101.0 please call the doctor.  For breastfeeding concerns, the lactation consultant can be reached at [phone].  For concerns about your baby, please call your pediatrician.   Keep a list of questions to bring to your postpartum visit. Your questions might be about:  Changes in your breasts, such as lumps or  soreness.  When to expect your menstrual period to start again.  What form of birth control is best for you.  Weight you have put on during the pregnancy.  Exercise options.  What foods and drinks are best for you, especially if you are breastfeeding.  Problems you might be having with breastfeeding.  When you can have sex. Some women may want to talk about lubricants for the vagina.  Any feelings of sadness or restlessness that you are having.   When should you call for help?  Call 911 anytime you think you may need emergency care. For example, call if:  You have thoughts of harming yourself, your baby, or another person.  You passed out (lost consciousness).  Call the office at [phone] or seek immediate medical care if:  If you have heavy bleeding such that you are soaking 1 pad in an hour for 2 hours  You are dizzy or lightheaded, or you feel like you may faint.  You have a fever; a temperature of 101.0 F or greater  Chills  Difficulty urinating  Headache unrelieved by "pain meds"   Visual changes  Pain in the right side of your belly near your ribs  Breasts reddened, hard, hot to the touch or any other breast concerns  Nipple discharge which is foul-smelling or contains pus   Increased pain at the site of the tear   New pain unrelieved with recommended over-the-counter dosages  Difficulty breathing with or without chest pain   New leg pain, swelling, or redness, especially if it is only on one leg  Any other concerns  Watch closely for changes in your health, and be sure to contact your provider if:  You have new or worse vaginal discharge.  You feel sad or depressed.  You are having problems with your breasts or breastfeeding.

## 2019-06-23 NOTE — Progress Notes (Signed)
Obstetric and Gynecology Name: YARISA LYNAM MRN:   [id number] DOB:   Dec 09, 1995           ADMISSION DATE:  06/18/2019 Progress note DATE: 06/23/2019  CHIEF COMPLAINT: Fever  BRIEF PATIENT DESCRIPTION:  23 yo female G1P1 s/p vaginal delivery 06/16/2019 with induction of labor for suspected atypical pre-eclampsia with sinus tach, unremitting headache and new-onset significant proteinuria, admitted to mother baby unit 08/13 with fevers secondary to presumed UTI with intermittently worsening tachycardia and acute blood loss anemia requiring transfer to the stepdown unit. She returned to the floor two days ago and is feeling much improved.  SIGNIFICANT LABS/STUDIES:  Blood cultures: Neg x4 days Urine cultures: Neg x4 days COVID-19: neg on admission  Echo: EF 55% CT abd/pelvis w/ contrast 8/14: RIGHT kidney edema, inflammation. 66mm RIGHT renal stone CT angio for PE 8/14: Neg for PE, mild pleural effusions and atelectasis Chest X-ray 8/15: Neg Chest X-ray 8/13: Neg   CONSULTING SERVICES: Urology Acute care hospitalist Internal medicine Lactation Cardiology  Subjective   She reports that she feels much better than she did a few days ago. feels so much better. No episodes of shaking/shivering for two days. Tolerating regular diet and ambulating well. She is anxious to go home.   Objective  Objective:   Vitals:   06/23/19 0647 06/23/19 0813 06/23/19 0853 06/23/19 1149  BP:  127/89  115/84  Pulse: 74 60  62  Resp:  20  18  Temp:  99.5 F (37.5 C) 98.3 F (36.8 C) 98.6 F (37 C)  TempSrc:  Oral Oral Oral  SpO2: 99% 98%  95%  Weight:      Height:        Intake/Output Summary (Last 24 hours) at 06/23/2019 1225 Last data filed at 06/23/2019 0900 Gross per 24 hour  Intake 1205.12 ml  Output 1400 ml  Net -194.88 ml    General: NAD Cardiovascular: tachycardic, reg rate, no murmurs Pulmonary: CTAB, normal respiratory effort Abdomen: Benign. Non-tender, +BS, no  guarding.  Extremities: No erythema or cords, no calf tenderness, with normal peripheral pulses. Back; 10cm diameter area of swelling mid-back, midline at epidural site.  Non-tender, marked.  Kernig, Brudzinski signs negative.  Neg neck stiffness.   Labs: Results for orders placed or performed during the hospital encounter of 06/18/19 (from the past 24 hour(s))  CBC     Status: Abnormal   Collection Time: 06/23/19 10:44 AM  Result Value Ref Range   WBC 6.3 4.0 - 10.5 K/uL   RBC 3.52 (L) 3.87 - 5.11 MIL/uL   Hemoglobin 10.0 (L) 12.0 - 15.0 g/dL   HCT 30.6 (L) 36.0 - 46.0 %   MCV 86.9 80.0 - 100.0 fL   MCH 28.4 26.0 - 34.0 pg   MCHC 32.7 30.0 - 36.0 g/dL   RDW 13.5 11.5 - 15.5 %   Platelets 221 150 - 400 K/uL   nRBC 0.0 0.0 - 0.2 %    Cultures: Results for orders placed or performed during the hospital encounter of 06/18/19  Culture, blood (routine x 2)     Status: None   Collection Time: 06/18/19  6:35 PM   Specimen: BLOOD  Result Value Ref Range Status   Specimen Description BLOOD RIGHT ANTECUBITAL  Final   Special Requests   Final    BOTTLES DRAWN AEROBIC AND ANAEROBIC Blood Culture adequate volume   Culture   Final    NO GROWTH 5 DAYS Performed at [facility], [place]  [address] [place], [place] [address]    Report Status 06/23/2019 FINAL  Final  Culture, blood (routine x 2)     Status: None   Collection Time: 06/18/19  6:35 PM   Specimen: BLOOD  Result Value Ref Range Status   Specimen Description BLOOD BLOOD LEFT WRIST  Final   Special Requests   Final    BOTTLES DRAWN AEROBIC AND ANAEROBIC Blood Culture adequate volume   Culture   Final    NO GROWTH 5 DAYS Performed at [facility], [address] [place], [place] [address]    Report Status 06/23/2019 FINAL  Final  Urine Culture     Status: Abnormal   Collection Time: 06/18/19  6:35 PM   Specimen: Urine, Random  Result Value Ref Range Status   Specimen Description   Final    URINE,  RANDOM Performed at [facility], [address], [place], [place] [address]    Special Requests   Final    NONE Performed at [facility], [address] [place], [place] [address]    Culture (A)  Final    <10,000 COLONIES/mL INSIGNIFICANT GROWTH Performed at [facility], [place] [address] [place], [place] [address]    Report Status 06/20/2019 FINAL  Final  SARS Coronavirus 2 [facility] order, Performed in [facility] hospital lab) Nasopharyngeal Nasopharyngeal Swab     Status: None   Collection Time: 06/18/19  7:18 PM   Specimen: Nasopharyngeal Swab  Result Value Ref Range Status   SARS Coronavirus 2 NEGATIVE NEGATIVE Final    Comment: (NOTE) If result is NEGATIVE SARS-CoV-2 target nucleic acids are NOT DETECTED. The SARS-CoV-2 RNA is generally detectable in upper and lower  respiratory specimens during the acute phase of infection. The lowest  concentration of SARS-CoV-2 viral copies this assay can detect is 250  copies / mL. A negative result does not preclude SARS-CoV-2 infection  and should not be used as the sole basis for treatment or other  patient management decisions.  A negative result may occur with  improper specimen collection / handling, submission of specimen other  than nasopharyngeal swab, presence of viral mutation(s) within the  areas targeted by this assay, and inadequate number of viral copies  (<250 copies / mL). A negative result must be combined with clinical  observations, patient history, and epidemiological information. If result is POSITIVE SARS-CoV-2 target nucleic acids are DETECTED. The SARS-CoV-2 RNA is generally detectable in upper and lower  respiratory specimens dur ing the acute phase of infection.  Positive  results are indicative of active infection with SARS-CoV-2.  Clinical  correlation with patient history and other diagnostic information is  necessary to determine patient infection status.  Positive results do   not rule out bacterial infection or co-infection with other viruses. If result is PRESUMPTIVE POSTIVE SARS-CoV-2 nucleic acids MAY BE PRESENT.   A presumptive positive result was obtained on the submitted specimen  and confirmed on repeat testing.  While 2019 novel coronavirus  (SARS-CoV-2) nucleic acids may be present in the submitted sample  additional confirmatory testing may be necessary for epidemiological  and / or clinical management purposes  to differentiate between  SARS-CoV-2 and other Sarbecovirus currently known to infect humans.  If clinically indicated additional testing with an alternate test  methodology [phone]) is advised. The SARS-CoV-2 RNA is generally  detectable in upper and lower respiratory sp ecimens during the acute  phase of infection. The expected result is Negative. Fact Sheet  for Patients:  StrictlyIdeas.no Fact Sheet for Healthcare Providers: BankingDealers.co.za This test is not yet approved or cleared by the [place] FDA and has been authorized for detection and/or diagnosis of SARS-CoV-2 by FDA under an Emergency Use Authorization (EUA).  This EUA will remain in effect (meaning this test can be used) for the duration of the COVID-19 declaration under Section 564(b)(1) of the Act, 21 U.S.C. section 360bbb-3(b)(1), unless the authorization is terminated or revoked sooner. Performed at [facility], [address] [place], [place] [address]     Imaging: Ct Angio Chest Pe W Or Wo Contrast  Result Date: 06/19/2019 CLINICAL DATA:  Chest tightness and fever. EXAM: CT ANGIOGRAPHY CHEST WITH CONTRAST TECHNIQUE: Multidetector CT imaging of the chest was performed using the standard protocol during bolus administration of intravenous contrast. Multiplanar CT image reconstructions and MIPs were obtained to evaluate the vascular anatomy. CONTRAST:  32mL OMNIPAQUE IOHEXOL 350 MG/ML SOLN COMPARISON:   None. FINDINGS: Cardiovascular: Some of the central and peripheral pulmonary artery branches are difficult to characterize due to mild patient motion artifact and blood flow artifact, however, there is no convincing pulmonary embolism identified within the main, lobar or segmental pulmonary arteries bilaterally. No occlusive pulmonary embolus identified. No thoracic aortic aneurysm or evidence of aortic dissection. Heart size is upper normal. No pericardial effusion. Mediastinum/Nodes: No mass or enlarged lymph nodes seen within the mediastinum or perihilar regions. Esophagus appears normal. Trachea and central bronchi are unremarkable. Lungs/Pleura: Mixed nodular and ground-glass consolidation within the RIGHT lower lobe and along the posterior margin of the RIGHT upper lobe. Some component may represent atelectasis. Small bilateral pleural effusions w, RIGHT slightly greater than LEFT. Upper Abdomen: Limited images of the upper abdomen are unremarkable. Musculoskeletal: No acute or suspicious osseous finding. Review of the MIP images confirms the above findings. IMPRESSION: 1. No pulmonary embolus seen, with mild study limitations detailed above. 2. Mixed nodular and ground-glass consolidations within the RIGHT lower lobe and along the posterior margin of the RIGHT upper lobe. Some component may represent atelectasis. Differential includes atypical pneumonias such as viral or fungal, interstitial pneumonias, edema related to volume overload/CHF, hypersensitivity pneumonitis, and respiratory bronchiolitis. 3. Small bilateral pleural effusions, RIGHT slightly greater than LEFT. 4. Heart size is upper normal. No pericardial effusion. Electronically Signed   By: Franki Cabot M.D.   On: 06/19/2019 16:18   Ct Abdomen Pelvis W Contrast  Result Date: 06/19/2019 CLINICAL DATA:  Chest tightness and fever. Recent vaginal delivery. Low back pain and headache. EXAM: CT ABDOMEN AND PELVIS WITH CONTRAST TECHNIQUE:  Multidetector CT imaging of the abdomen and pelvis was performed using the standard protocol following bolus administration of intravenous contrast. CONTRAST:  12mL OMNIPAQUE IOHEXOL 350 MG/ML SOLN COMPARISON:  None. FINDINGS: Lower chest: Small bilateral pleural effusions, with associated mild atelectasis. Hepatobiliary: No focal liver abnormality is seen. No gallstones, gallbladder wall thickening, or biliary dilatation. Pancreas: Unremarkable. No pancreatic ductal dilatation or surrounding inflammatory changes. Spleen: Normal in size without focal abnormality. Adrenals/Urinary Tract: Adrenal glands appear normal. Ill-defined edema throughout of the majority of the RIGHT renal cortex, indicating pyelonephritis. Associated perinephric fluid/inflammation. Questionable additional edema at the upper pole of the LEFT kidney, but not definitive. Mild bilateral hydronephrosis ww, likely physiologic related to the recently gravid uterus. 1 mm RIGHT renal stone Stomach/Bowel: z fairly large amount of stool within the rectosigmoid colon. Or proximal segments of the large bowel are normal in caliber. No small bowel dilatation. Stomach is unremarkable, partially decompressed. Appendix  is normal. Vascular/Lymphatic: No significant vascular findings are present. No enlarged abdominal or pelvic lymph nodes. Reproductive: Enlarged uterus, compatible with the given history of recent pregnancy. Other: Small amount of free fluid in the pelvis. No abscess collection identified. No free intraperitoneal air. Musculoskeletal: No acute or suspicious osseous finding. IMPRESSION: 1. Pyelonephritis of the RIGHT kidney, of a fairly severe degree, with edema throughout the RIGHT renal cortex and prominent perinephric fluid/inflammation. Questionable additional pyelonephritis at the upper pole of the LEFT kidney, but not convincing. 2. Mild bilateral hydronephrosis, likely physiologic related to the recently gravid uterus. 3. 1 mm RIGHT renal  stone. 4. Fairly large amount of stool within the rectosigmoid colon (constipation? ). 5. Small bilateral pleural effusions, with associated mild atelectasis. 6. No abscess collection identified. However, would consider follow-up CT abdomen within 1 week to exclude early developing abscess within the RIGHT renal cortex. Electronically Signed   By: Franki Cabot M.D.   On: 06/19/2019 16:42   Dg Chest Port 1 View  Result Date: 06/20/2019 CLINICAL DATA:  Respiratory failure. EXAM: PORTABLE CHEST 1 VIEW COMPARISON:  06/19/2019 FINDINGS: Normal heart size. No pleural effusion or edema identified. No airspace consolidation identified. No atelectasis. The visualized osseous structures are unremarkable. IMPRESSION: 1. No acute cardiopulmonary abnormality. Electronically Signed   By: Kerby Moors M.D.   On: 06/20/2019 08:36   Dg Chest Port 1 View  Result Date: 06/18/2019 CLINICAL DATA:  Fever today. Recent postpartum post vaginal delivery. EXAM: PORTABLE CHEST 1 VIEW COMPARISON:  None. FINDINGS: The cardiomediastinal contours are normal. Scattered subsegmental atelectasis. No confluent airspace disease. Pulmonary vasculature is normal. No large pleural effusion or pneumothorax. No acute osseous abnormalities are seen. IMPRESSION: Scattered subsegmental atelectasis. Electronically Signed   By: Keith Rake M.D.   On: 06/18/2019 18:59     Assessment   22 y.o. [place] [facility] Day: 6   Plan   1. Postpartum: -lochia is normal and blood loss is as expected.  She should have no more significat losses after evacuation of blood clots.   -s/p lactation visit to help milk dry up -fundus below umbilicus  2. Fever: -negative cultures both urine and blood -had been spiking in spite of antibiotics, now afebrile x 40 hours -s/p Meropenem, now on ceftriaxone 1g q24hrs and metronidazole 500mg  q8hrs.  -diagnosis of exclusion: septic thrombophlebitis, treatment is anti-coagulation. She has been on LOVENOX  1mg /kg (75mg ) BID since 8/15 evening. Ddx strongly suspected if her fevers resolve within 48hrs of anticoagulation.  -tachycardia has resolved, now HR 50s-70s  3. Symptomatic anemia: -s/p 2u PRBC for symptomatic anemia -s/p manual evacuation of uterine clots  -hemoglobin 10.0 today -continue ferrous sulfate PO BID  4. Kidney stone: s/p CT CAP  With concern for pyelonephritis (right), and non-obstructing kidney stone.  Patient was supposed to see urology as outpatient after delivery for stent placement due to stone.  They do not think stone is large enough for stent while inpatient, but recommend outpatient f/u for hx of multiple stones. -still present, and was advised to see urology post-delivery for stent. However, urology suggests outpatient work up for multiple stones but no need for stent while inpatient for 89mm stone.  5. Continue reg diet, OOB as often as possible  Plan to discuss patient and current condition with Dr. Leonides Schanz to determine further management steps and timeline for discharge planning.   Lisette Grinder, [place] 06/23/2019 12:25 PM

## 2019-06-23 NOTE — Discharge Summary (Signed)
Patient ID: Patricia Pearson MRN: [id number] DOB/AGE: [birth year] 22 y.o.  Admit date: 06/18/2019 Discharge date: 06/23/2019  Admission Diagnoses: Fever  Discharge Diagnoses:  Septic thrombophlebitis  Consults:  Urology Acute care hospitalist Internal medicine Lactation Cardiology  Significant Diagnostic Studies:  Results for orders placed or performed during the hospital encounter of 06/18/19 (from the past 168 hour(s))  Comprehensive metabolic panel   Collection Time: 06/18/19  6:34 PM  Result Value Ref Range   Sodium 132 (L) 135 - 145 mmol/L   Potassium 3.1 (L) 3.5 - 5.1 mmol/L   Chloride 102 98 - 111 mmol/L   CO2 21 (L) 22 - 32 mmol/L   Glucose, Bld 107 (H) 70 - 99 mg/dL   BUN 6 6 - 20 mg/dL   Creatinine, Ser 0.62 0.44 - 1.00 mg/dL   Calcium 8.0 (L) 8.9 - 10.3 mg/dL   Total Protein 5.6 (L) 6.5 - 8.1 g/dL   Albumin 2.4 (L) 3.5 - 5.0 g/dL   AST 24 15 - 41 U/L   ALT 13 0 - 44 U/L   Alkaline Phosphatase 140 (H) 38 - 126 U/L   Total Bilirubin 0.4 0.3 - 1.2 mg/dL   GFR calc non Af Amer >60 >60 mL/min   GFR calc Af Amer >60 >60 mL/min   Anion gap 9 5 - 15  Lactic acid, plasma   Collection Time: 06/18/19  6:34 PM  Result Value Ref Range   Lactic Acid, Venous 2.1 (HH) 0.5 - 1.9 mmol/L  CBC with Differential   Collection Time: 06/18/19  6:34 PM  Result Value Ref Range   WBC 9.0 4.0 - 10.5 K/uL   RBC 2.85 (L) 3.87 - 5.11 MIL/uL   Hemoglobin 8.1 (L) 12.0 - 15.0 g/dL   HCT 25.0 (L) 36.0 - 46.0 %   MCV 87.7 80.0 - 100.0 fL   MCH 28.4 26.0 - 34.0 pg   MCHC 32.4 30.0 - 36.0 g/dL   RDW 13.2 11.5 - 15.5 %   Platelets 151 150 - 400 K/uL   nRBC 0.0 0.0 - 0.2 %   Neutrophils Relative % 88 %   Neutro Abs 8.0 (H) 1.7 - 7.7 K/uL   Lymphocytes Relative 5 %   Lymphs Abs 0.5 (L) 0.7 - 4.0 K/uL   Monocytes Relative 6 %   Monocytes Absolute 0.5 0.1 - 1.0 K/uL   Eosinophils Relative 0 %   Eosinophils Absolute 0.0 0.0 - 0.5 K/uL   Basophils Relative 0 %   Basophils Absolute 0.0  0.0 - 0.1 K/uL   Immature Granulocytes 1 %   Abs Immature Granulocytes 0.09 (H) 0.00 - 0.07 K/uL  Uric acid   Collection Time: 06/18/19  6:34 PM  Result Value Ref Range   Uric Acid, Serum 3.1 2.5 - 7.1 mg/dL  Culture, blood (routine x 2)   Collection Time: 06/18/19  6:35 PM   Specimen: BLOOD  Result Value Ref Range   Specimen Description BLOOD RIGHT ANTECUBITAL    Special Requests      BOTTLES DRAWN AEROBIC AND ANAEROBIC Blood Culture adequate volume   Culture      NO GROWTH 5 DAYS Performed at [facility], [address] [place], [place] [address]    Report Status 06/23/2019 FINAL   Culture, blood (routine x 2)   Collection Time: 06/18/19  6:35 PM   Specimen: BLOOD  Result Value Ref Range   Specimen Description BLOOD BLOOD LEFT WRIST    Special Requests  BOTTLES DRAWN AEROBIC AND ANAEROBIC Blood Culture adequate volume   Culture      NO GROWTH 5 DAYS Performed at [facility], [address] [place], [place] [address]    Report Status 06/23/2019 FINAL   Urine Culture   Collection Time: 06/18/19  6:35 PM   Specimen: Urine, Random  Result Value Ref Range   Specimen Description      URINE, RANDOM Performed at [facility], [address] [place], [place] [address]    Special Requests      NONE Performed at [facility], [address] [place], [place] [address]    Culture (A)     <10,000 COLONIES/mL INSIGNIFICANT GROWTH Performed at [facility] [address], [place], [place] [address]    Report Status 06/20/2019 FINAL   Urinalysis, Complete w Microscopic   Collection Time: 06/18/19  6:35 PM  Result Value Ref Range   Color, Urine YELLOW (A) YELLOW   APPearance CLOUDY (A) CLEAR   Specific Gravity, Urine 1.017 1.005 - 1.030   pH 6.0 5.0 - 8.0   Glucose, UA NEGATIVE NEGATIVE mg/dL   Hgb urine dipstick LARGE (A) NEGATIVE   Bilirubin Urine NEGATIVE NEGATIVE   Ketones, ur 5 (A) NEGATIVE mg/dL   Protein, ur  100 (A) NEGATIVE mg/dL   Nitrite NEGATIVE NEGATIVE   Leukocytes,Ua MODERATE (A) NEGATIVE   RBC / HPF >50 (H) 0 - 5 RBC/hpf   WBC, UA >50 (H) 0 - 5 WBC/hpf   Bacteria, UA RARE (A) NONE SEEN   Squamous Epithelial / LPF 0-5 0 - 5   Mucus PRESENT   Protein / creatinine ratio, urine   Collection Time: 06/18/19  6:35 PM  Result Value Ref Range   Creatinine, Urine 178 mg/dL   Total Protein, Urine 88 mg/dL   Protein Creatinine Ratio 0.49 (H) 0.00 - 0.15 mg/mg[Cre]  SARS Coronavirus 2 [facility] order, Performed in [facility] hospital lab) Nasopharyngeal Nasopharyngeal Swab   Collection Time: 06/18/19  7:18 PM   Specimen: Nasopharyngeal Swab  Result Value Ref Range   SARS Coronavirus 2 NEGATIVE NEGATIVE  Lactic acid, plasma   Collection Time: 06/18/19  8:58 PM  Result Value Ref Range   Lactic Acid, Venous 0.8 0.5 - 1.9 mmol/L  Type and screen [place]   Collection Time: 06/18/19  9:59 PM  Result Value Ref Range   ABO/RH(D) O POS    Antibody Screen NEG    Sample Expiration 06/21/2019,2359    Unit Number [id number]    Blood Component Type RED CELLS,LR    Unit division 00    Status of Unit ISSUED,FINAL    Transfusion Status OK TO TRANSFUSE    Crossmatch Result      Compatible Performed at [facility], [address] [place], [place] [address]   BPAM [facility]   Collection Time: 06/18/19  9:59 PM  Result Value Ref Range   ISSUE DATE / TIME [id number]    Blood Product Unit Number [id number]    PRODUCT CODE [id number]    Unit Type and Rh 5100    Blood Product Expiration Date 160109323557   CBC on admission   Collection Time: 06/19/19  4:45 AM  Result Value Ref Range   WBC 7.3 4.0 - 10.5 K/uL   RBC 2.53 (L) 3.87 - 5.11 MIL/uL   Hemoglobin 7.1 (L) 12.0 - 15.0 g/dL   HCT 22.3 (L) 36.0 - 46.0 %   MCV 88.1 80.0 -  100.0 fL   MCH 28.1 26.0 - 34.0 pg   MCHC 31.8 30.0 - 36.0 g/dL   RDW 13.3 11.5 - 15.5 %   Platelets 142 (L) 150 - 400 K/uL   nRBC  0.0 0.0 - 0.2 %  Comprehensive metabolic panel   Collection Time: 06/19/19  4:45 AM  Result Value Ref Range   Sodium 135 135 - 145 mmol/L   Potassium 3.7 3.5 - 5.1 mmol/L   Chloride 109 98 - 111 mmol/L   CO2 20 (L) 22 - 32 mmol/L   Glucose, Bld 115 (H) 70 - 99 mg/dL   BUN 7 6 - 20 mg/dL   Creatinine, Ser 0.49 0.44 - 1.00 mg/dL   Calcium 7.8 (L) 8.9 - 10.3 mg/dL   Total Protein 5.0 (L) 6.5 - 8.1 g/dL   Albumin 2.0 (L) 3.5 - 5.0 g/dL   AST 28 15 - 41 U/L   ALT 15 0 - 44 U/L   Alkaline Phosphatase 122 38 - 126 U/L   Total Bilirubin 0.5 0.3 - 1.2 mg/dL   GFR calc non Af Amer >60 >60 mL/min   GFR calc Af Amer >60 >60 mL/min   Anion gap 6 5 - 15  hCG, quantitative, pregnancy   Collection Time: 06/19/19  4:45 AM  Result Value Ref Range   hCG, Beta Chain, Quant, S 1,263 (H) <5 mIU/mL  Prepare RBC   Collection Time: 06/19/19  9:15 AM  Result Value Ref Range   Order Confirmation      ORDER PROCESSED BY BLOOD BANK Performed at [facility], [address] [place], [place] [address]   Troponin I (High Sensitivity)   Collection Time: 06/19/19  9:47 AM  Result Value Ref Range   Troponin I (High Sensitivity) 4 <18 ng/L  Glucose, capillary   Collection Time: 06/19/19 12:49 PM  Result Value Ref Range   Glucose-Capillary 58 (L) 70 - 99 mg/dL  Glucose, capillary   Collection Time: 06/19/19  1:05 PM  Result Value Ref Range   Glucose-Capillary 76 70 - 99 mg/dL  Glucose, capillary   Collection Time: 06/19/19  1:44 PM  Result Value Ref Range   Glucose-Capillary 106 (H) 70 - 99 mg/dL  Protime-INR   Collection Time: 06/19/19  3:04 PM  Result Value Ref Range   Prothrombin Time 15.3 (H) 11.4 - 15.2 seconds   INR 1.2 0.8 - 1.2  Fibrinogen   Collection Time: 06/19/19  3:04 PM  Result Value Ref Range   Fibrinogen 630 (H) 210 - 475 mg/dL  Lactic acid, plasma   Collection Time: 06/19/19  3:04 PM  Result Value Ref Range   Lactic Acid, Venous 1.3 0.5 - 1.9 mmol/L  Comprehensive  metabolic panel   Collection Time: 06/19/19  3:04 PM  Result Value Ref Range   Sodium 137 135 - 145 mmol/L   Potassium 3.2 (L) 3.5 - 5.1 mmol/L   Chloride 109 98 - 111 mmol/L   CO2 20 (L) 22 - 32 mmol/L   Glucose, Bld 97 70 - 99 mg/dL   BUN 7 6 - 20 mg/dL   Creatinine, Ser 0.50 0.44 - 1.00 mg/dL   Calcium 7.9 (L) 8.9 - 10.3 mg/dL   Total Protein 5.2 (L) 6.5 - 8.1 g/dL   Albumin 2.2 (L) 3.5 - 5.0 g/dL   AST 48 (H) 15 - 41 U/L   ALT 25 0 - 44 U/L   Alkaline Phosphatase 112 38 - 126 U/L   Total Bilirubin  0.4 0.3 - 1.2 mg/dL   GFR calc non Af Amer >60 >60 mL/min   GFR calc Af Amer >60 >60 mL/min   Anion gap 8 5 - 15  CBC   Collection Time: 06/19/19  3:04 PM  Result Value Ref Range   WBC 7.9 4.0 - 10.5 K/uL   RBC 3.02 (L) 3.87 - 5.11 MIL/uL   Hemoglobin 8.6 (L) 12.0 - 15.0 g/dL   HCT 26.1 (L) 36.0 - 46.0 %   MCV 86.4 80.0 - 100.0 fL   MCH 28.5 26.0 - 34.0 pg   MCHC 33.0 30.0 - 36.0 g/dL   RDW 13.7 11.5 - 15.5 %   Platelets 139 (L) 150 - 400 K/uL   nRBC 0.0 0.0 - 0.2 %  Procalcitonin - Baseline   Collection Time: 06/19/19  3:04 PM  Result Value Ref Range   Procalcitonin 4.18 ng/mL  CBC   Collection Time: 06/19/19  9:10 PM  Result Value Ref Range   WBC 11.2 (H) 4.0 - 10.5 K/uL   RBC 2.95 (L) 3.87 - 5.11 MIL/uL   Hemoglobin 8.4 (L) 12.0 - 15.0 g/dL   HCT 25.7 (L) 36.0 - 46.0 %   MCV 87.1 80.0 - 100.0 fL   MCH 28.5 26.0 - 34.0 pg   MCHC 32.7 30.0 - 36.0 g/dL   RDW 13.9 11.5 - 15.5 %   Platelets 133 (L) 150 - 400 K/uL   nRBC 0.0 0.0 - 0.2 %  Basic metabolic panel   Collection Time: 06/19/19  9:10 PM  Result Value Ref Range   Sodium 136 135 - 145 mmol/L   Potassium 3.3 (L) 3.5 - 5.1 mmol/L   Chloride 108 98 - 111 mmol/L   CO2 21 (L) 22 - 32 mmol/L   Glucose, Bld 114 (H) 70 - 99 mg/dL   BUN 8 6 - 20 mg/dL   Creatinine, Ser 0.51 0.44 - 1.00 mg/dL   Calcium 7.9 (L) 8.9 - 10.3 mg/dL   GFR calc non Af Amer >60 >60 mL/min   GFR calc Af Amer >60 >60 mL/min   Anion gap 7 5  - 15  Magnesium   Collection Time: 06/19/19  9:10 PM  Result Value Ref Range   Magnesium 1.4 (L) 1.7 - 2.4 mg/dL  Phosphorus   Collection Time: 06/19/19  9:10 PM  Result Value Ref Range   Phosphorus 3.6 2.5 - 4.6 mg/dL  Procalcitonin   Collection Time: 06/20/19  5:09 AM  Result Value Ref Range   Procalcitonin 36.56 ng/mL  CBC   Collection Time: 06/20/19  5:09 AM  Result Value Ref Range   WBC 8.1 4.0 - 10.5 K/uL   RBC 2.79 (L) 3.87 - 5.11 MIL/uL   Hemoglobin 7.9 (L) 12.0 - 15.0 g/dL   HCT 24.6 (L) 36.0 - 46.0 %   MCV 88.2 80.0 - 100.0 fL   MCH 28.3 26.0 - 34.0 pg   MCHC 32.1 30.0 - 36.0 g/dL   RDW 14.0 11.5 - 15.5 %   Platelets 133 (L) 150 - 400 K/uL   nRBC 0.0 0.0 - 0.2 %  Comprehensive metabolic panel   Collection Time: 06/20/19  5:09 AM  Result Value Ref Range   Sodium 136 135 - 145 mmol/L   Potassium 4.0 3.5 - 5.1 mmol/L   Chloride 108 98 - 111 mmol/L   CO2 21 (L) 22 - 32 mmol/L   Glucose, Bld 91 70 - 99 mg/dL   BUN 10 6 - 20  mg/dL   Creatinine, Ser 0.48 0.44 - 1.00 mg/dL   Calcium 8.2 (L) 8.9 - 10.3 mg/dL   Total Protein 5.0 (L) 6.5 - 8.1 g/dL   Albumin 2.0 (L) 3.5 - 5.0 g/dL   AST 37 15 - 41 U/L   ALT 25 0 - 44 U/L   Alkaline Phosphatase 104 38 - 126 U/L   Total Bilirubin 0.4 0.3 - 1.2 mg/dL   GFR calc non Af Amer >60 >60 mL/min   GFR calc Af Amer >60 >60 mL/min   Anion gap 7 5 - 15  ECHOCARDIOGRAM COMPLETE   Collection Time: 06/20/19  2:50 PM  Result Value Ref Range   Weight 2,705.49 oz   Height 63 in   BP 105/72 mmHg  Procalcitonin   Collection Time: 06/21/19  5:25 AM  Result Value Ref Range   Procalcitonin 23.02 ng/mL  CBC   Collection Time: 06/21/19  5:25 AM  Result Value Ref Range   WBC 8.0 4.0 - 10.5 K/uL   RBC 2.75 (L) 3.87 - 5.11 MIL/uL   Hemoglobin 7.7 (L) 12.0 - 15.0 g/dL   HCT 24.4 (L) 36.0 - 46.0 %   MCV 88.7 80.0 - 100.0 fL   MCH 28.0 26.0 - 34.0 pg   MCHC 31.6 30.0 - 36.0 g/dL   RDW 14.0 11.5 - 15.5 %   Platelets 147 (L) 150 - 400  K/uL   nRBC 0.0 0.0 - 0.2 %  Magnesium   Collection Time: 06/21/19  5:25 AM  Result Value Ref Range   Magnesium 1.4 (L) 1.7 - 2.4 mg/dL  Phosphorus   Collection Time: 06/21/19  5:25 AM  Result Value Ref Range   Phosphorus 4.1 2.5 - 4.6 mg/dL  CBC with Differential/Platelet   Collection Time: 06/21/19  9:02 PM  Result Value Ref Range   WBC 7.4 4.0 - 10.5 K/uL   RBC 2.75 (L) 3.87 - 5.11 MIL/uL   Hemoglobin 7.9 (L) 12.0 - 15.0 g/dL   HCT 23.9 (L) 36.0 - 46.0 %   MCV 86.9 80.0 - 100.0 fL   MCH 28.7 26.0 - 34.0 pg   MCHC 33.1 30.0 - 36.0 g/dL   RDW 13.9 11.5 - 15.5 %   Platelets 164 150 - 400 K/uL   nRBC 0.0 0.0 - 0.2 %   Neutrophils Relative % 76 %   Neutro Abs 5.7 1.7 - 7.7 K/uL   Lymphocytes Relative 15 %   Lymphs Abs 1.1 0.7 - 4.0 K/uL   Monocytes Relative 6 %   Monocytes Absolute 0.4 0.1 - 1.0 K/uL   Eosinophils Relative 1 %   Eosinophils Absolute 0.1 0.0 - 0.5 K/uL   Basophils Relative 0 %   Basophils Absolute 0.0 0.0 - 0.1 K/uL   Immature Granulocytes 2 %   Abs Immature Granulocytes 0.12 (H) 0.00 - 0.07 K/uL  Comprehensive metabolic panel   Collection Time: 06/22/19  1:18 AM  Result Value Ref Range   Sodium 136 135 - 145 mmol/L   Potassium 3.5 3.5 - 5.1 mmol/L   Chloride 98 98 - 111 mmol/L   CO2 25 22 - 32 mmol/L   Glucose, Bld 89 70 - 99 mg/dL   BUN 10 6 - 20 mg/dL   Creatinine, Ser 0.55 0.44 - 1.00 mg/dL   Calcium 8.0 (L) 8.9 - 10.3 mg/dL   Total Protein 5.2 (L) 6.5 - 8.1 g/dL   Albumin 2.0 (L) 3.5 - 5.0 g/dL   AST 29 15 - 41  U/L   ALT 24 0 - 44 U/L   Alkaline Phosphatase 140 (H) 38 - 126 U/L   Total Bilirubin 0.2 (L) 0.3 - 1.2 mg/dL   GFR calc non Af Amer >60 >60 mL/min   GFR calc Af Amer >60 >60 mL/min   Anion gap 13 5 - 15  CBC   Collection Time: 06/23/19 10:44 AM  Result Value Ref Range   WBC 6.3 4.0 - 10.5 K/uL   RBC 3.52 (L) 3.87 - 5.11 MIL/uL   Hemoglobin 10.0 (L) 12.0 - 15.0 g/dL   HCT 30.6 (L) 36.0 - 46.0 %   MCV 86.9 80.0 - 100.0 fL   MCH  28.4 26.0 - 34.0 pg   MCHC 32.7 30.0 - 36.0 g/dL   RDW 13.5 11.5 - 15.5 %   Platelets 221 150 - 400 K/uL   nRBC 0.0 0.0 - 0.2 %  Results for orders placed or performed during the hospital encounter of 06/15/19 (from the past 168 hour(s))  CBC   Collection Time: 06/17/19  7:23 AM  Result Value Ref Range   WBC 9.9 4.0 - 10.5 K/uL   RBC 3.04 (L) 3.87 - 5.11 MIL/uL   Hemoglobin 8.6 (L) 12.0 - 15.0 g/dL   HCT 26.7 (L) 36.0 - 46.0 %   MCV 87.8 80.0 - 100.0 fL   MCH 28.3 26.0 - 34.0 pg   MCHC 32.2 30.0 - 36.0 g/dL   RDW 13.1 11.5 - 15.5 %   Platelets 163 150 - 400 K/uL   nRBC 0.0 0.0 - 0.2 %  CBC   Collection Time: 06/18/19  7:17 AM  Result Value Ref Range   WBC 9.8 4.0 - 10.5 K/uL   RBC 2.97 (L) 3.87 - 5.11 MIL/uL   Hemoglobin 8.3 (L) 12.0 - 15.0 g/dL   HCT 26.2 (L) 36.0 - 46.0 %   MCV 88.2 80.0 - 100.0 fL   MCH 27.9 26.0 - 34.0 pg   MCHC 31.7 30.0 - 36.0 g/dL   RDW 13.2 11.5 - 15.5 %   Platelets 182 150 - 400 K/uL   nRBC 0.0 0.0 - 0.2 %  Comprehensive metabolic panel   Collection Time: 06/18/19  7:17 AM  Result Value Ref Range   Sodium 137 135 - 145 mmol/L   Potassium 4.0 3.5 - 5.1 mmol/L   Chloride 105 98 - 111 mmol/L   CO2 22 22 - 32 mmol/L   Glucose, Bld 99 70 - 99 mg/dL   BUN 6 6 - 20 mg/dL   Creatinine, Ser 0.63 0.44 - 1.00 mg/dL   Calcium 8.2 (L) 8.9 - 10.3 mg/dL   Total Protein 5.4 (L) 6.5 - 8.1 g/dL   Albumin 2.1 (L) 3.5 - 5.0 g/dL   AST 22 15 - 41 U/L   ALT 11 0 - 44 U/L   Alkaline Phosphatase 151 (H) 38 - 126 U/L   Total Bilirubin 0.3 0.3 - 1.2 mg/dL   GFR calc non Af Amer >60 >60 mL/min   GFR calc Af Amer >60 >60 mL/min   Anion gap 10 5 - 15    Hospital Course:  This is a 23 y.o. G1P1001 admitted on PPD2 for fever, chills, rigors, and tachycardia, after being discharged home earlier that day. She was admitted for presumed sepsis, though with normal exam. She was given IVF, IV gentamycin and clindamycin, and scheduled Tylenol.   Hospital day 2 (8/14):  Tilly started to complain of shortness of breath and chest tightness.  EKG revealed sinus tachycardia and troponins were negative. She also had an episode of increased vaginal bleeding, for which she was treated with IM Methergine x1.  She was transfused with 2 units of PRBCs for symptomatic anemia with a starting hemoglobin of 7.1. She had another episode of shaking/shivering, which was accompanied by hypogyclemia. Hypoglycemia was treated with some juice and 1 amp of D50, which resolved the hypoglycemia. She was transferred to the stepdown unit in the ICU by request of Dr. Leafy Ro. Internal medicine was consulted, who recommended IV ceftriaxone for presumed UTI based on UA, as well as an echocardiogram for chest tightness. CT abdomen and pelvis was performed, concerning for pyelonephritis of the right kidney, with no evidence of PE or endometritis. She was started on meropenem in the stepdown unit.   Hospital day 3 (8/15): Tachycardia improved initially and patient was going to be transferred out of the stepdown unit, but that afternoon she had another episode of chills and rigors, with heart rate to the 120s-130s. Because of this, she remained in the stepdown unit. She was febrile to 102.53F that afternoon. She was seen by cardiology, who recommended repeat echocardiogram on an outpatient basis, but was not concerned for anything cardiac in nature as a contributing cause to her presentation. She was also seen by anesthesia at some point for assessment of back pain at epidural site, who were not concerned, though they did not write a note in her electronic record. Blood and urine cultures were negative at this point and she was still spiking fevers while receiving antibiotics. Because of this, she was presumed to have septic thrombophlebitis and started on therapeutic anti-coagualation. She had been seen by lactation to help her pump to maintain milk supply, but at some point she became overwhelmed with  everything going on and decided to stop. Because of this, lactation provided her assistance in helping her milk to dry up.   Hospital day 4 (8/16): She was transferred from the stepdown unit back to the women's care floor. She had another temperature spike with an accompanying episode of shaking/shivering that afternoon. Her last febrile temperature was 101.53F around 1730, with tmax 102.8 shortly before. She was seen by urology for pyelonephritis and history of kidney stones. Urology advised no stenting while she was inpatient and advised outpatient follow-up.   Hospital day 5 (8/17): She had a much better day, with tmax 99.5, no episodes of shaking/shivering, and reported almost feeling normal again. She was seen again by urology who recommended a renal ultrasound. They did not order this ultrasound, so it was not performed.   Hospital day 6 (8/18): She remained stable, without tachycardia or fever. As she had been afebrile for 48 hours, both antibiotics and anticoagulation have been stopped. She was deemed stable for discharge to home with outpatient follow up.  Discharge Physical Exam:  BP 120/85 (BP Location: Right Arm)   Pulse 70   Temp 98.4 F (36.9 C) (Oral)   Resp 18   Ht 5\' 3"  (1.6 m)   Wt 76.7 kg   SpO2 99%   BMI 29.95 kg/m   Constitutional: NAD, AAOx3  HE/ENT: extraocular movements grossly intact, moist mucous membranes CV: RRR PULM: normal respiratory effort, CTABL     Back: symmetric, no CVAT Abd: gravid, non-tender, non-distended, soft  Uterine Fundus: firm, below umbilicus Extremities: No evidence of DVT seen on physical exam. No lower extremity edema. Psych: mood appropriate, speech normal   Discharge Condition: Stable  Disposition: Discharge disposition: 01-Home  or Self Care        Allergies as of 06/23/2019   No Known Allergies     Medication List    TAKE these medications   acetaminophen 500 MG tablet Commonly known as: TYLENOL Take 2 tablets (1,000  mg total) by mouth every 6 (six) hours as needed for mild pain, moderate pain or headache. What changed:   medication strength  how much to take  reasons to take this Notes to patient: Take every 6 hours 6pm, 83mn, 6am, 12 noon   benzocaine-Menthol 20-0.5 % Aero Commonly known as: DERMOPLAST Apply 1 application topically as needed for irritation (perineal discomfort).   ferrous sulfate 325 (65 FE) MG EC tablet Take 325 mg by mouth daily.   ibuprofen 600 MG tablet Commonly known as: ADVIL Take 1 tablet (600 mg total) by mouth every 6 (six) hours as needed for headache, mild pain, moderate pain or cramping. What changed:   when to take this  reasons to take this  Another medication with the same name was removed. Continue taking this medication, and follow the directions you see here.   multivitamin-prenatal 27-0.8 MG Tabs tablet Take 1 tablet by mouth daily at 12 noon.   norethindrone 0.35 MG tablet Commonly known as: Ortho Micronor Take 1 tablet (0.35 mg total) by mouth daily.      Follow-up Information    Lisette Grinder, CNM. Schedule an appointment as soon as possible for a visit in 5 week(s).   Specialty: Certified Nurse Midwife Why: For routine postpartum visit Contact information: [place] [address] [place]. Schedule an appointment as soon as possible for a visit.   Specialty: Urology Why: For kidney stones Contact information: [address], [place] [phone]          Patient's progress and discharge plan of care, including medications, discussed with Dr. Vikki Ports Ward prior to discharge. Dr. Leonides Schanz was in agreement with discharge plan.   Signed: Lisette Grinder, CNM 06/23/2019 6:39 PM

## 2019-06-23 NOTE — Progress Notes (Signed)
Afebrile,VSS. Alert and oriented with quiet affect. Runs low Heart Rate when sleeping but HR increases with activity/movement. Color is pale, Skin w&d, BBS clear. Is tolerating P.O. Pt.states she feels," So much better." She took a shower last evening and tolerated well. She c/o nausea at 2148 and 0630; stated relief with Zofran as Per PRN order. C/O H/A at 0630 and Tylenol given. She has been up ambulating independently with steady gait. Voiding clear amber urine. No stones strained.

## 2019-06-23 NOTE — Progress Notes (Signed)
DC inst reviewed with pt.  Verb u/o of meds to take at home and f/u appts.  DC to home via wheelchair.

## 2019-06-24 ENCOUNTER — Telehealth: Payer: Self-pay | Admitting: Urology

## 2019-06-24 NOTE — Telephone Encounter (Signed)
Pt called and needs a follow up appt for kidney stones, she was discharged yesterday from the hospital. She has never been seen here. Please advise.

## 2019-06-29 NOTE — Telephone Encounter (Signed)
Her stone is nonobstructing and she is newly post partum.  A few weeks is fine unless she is having symptoms.    Hollice Espy, MD

## 2019-07-03 NOTE — Telephone Encounter (Signed)
Appt made, pt notified.

## 2019-07-07 ENCOUNTER — Ambulatory Visit (INDEPENDENT_AMBULATORY_CARE_PROVIDER_SITE_OTHER): Payer: 59 | Admitting: Family Medicine

## 2019-07-07 ENCOUNTER — Encounter: Payer: Self-pay | Admitting: Family Medicine

## 2019-07-07 ENCOUNTER — Other Ambulatory Visit: Payer: Self-pay

## 2019-07-07 VITALS — BP 136/91 | HR 96 | Temp 96.3°F

## 2019-07-07 DIAGNOSIS — L239 Allergic contact dermatitis, unspecified cause: Secondary | ICD-10-CM | POA: Diagnosis not present

## 2019-07-07 MED ORDER — TRIAMCINOLONE ACETONIDE 0.5 % EX CREA: 1.0000 "application " | TOPICAL_CREAM | Freq: Two times a day (BID) | CUTANEOUS | 1 refills | Status: DC

## 2019-07-07 NOTE — Patient Instructions (Addendum)
Likely a contact dermatitis or hive reaction Try to find trigger for allergy Use triamcinolone cream on the rash up to twice a day for 1-2 weeks to calm down red itchy and hive reaction Follow-up if not improved  Contact GYN to discuss further with your temperature and any other concerns they have from hospitalization.  Keep Urology apt  If develop significant worsening or new concerns for infection then would need to seek care sooner at hospital.  Please schedule a Follow-up Appointment to: Return in about 1 week (around 07/14/2019), or if symptoms worsen or fail to improve, for rash.  If you have any other questions or concerns, please feel free to call the office or send a message through [place]. You may also schedule an earlier appointment if necessary.  Additionally, you may be receiving a survey about your experience at our office within a few days to 1 week by e-mail or mail. We value your feedback.  Nobie Putnam, DO [place]

## 2019-07-07 NOTE — Progress Notes (Signed)
Virtual Visit via Telephone The purpose of this virtual visit is to provide medical care while limiting exposure to the novel coronavirus (COVID19) for both patient and office staff.  Consent was obtained for phone visit:  Yes.   Answered questions that patient had about telehealth interaction:  Yes.   I discussed the limitations, risks, security and privacy concerns of performing an evaluation and management service by telephone. I also discussed with the patient that there may be a patient responsible charge related to this service. The patient expressed understanding and agreed to proceed.  Patient Location: Home Provider Location: [facility])  ---------------------------------------------------------------------- Chief Complaint  Patient presents with  . Rash    Rash with itching and bumps noted on abdominal area x1 week. Patient stated she was recently released from the hospital ICU.    S: Reviewed CMA documentation. I have called patient and gathered additional HPI as follows:  RASH, URTICARIA Reports that symptoms started over past 1 week mostly on abdomen in waist area, she has had prior urticaria and hive reaction in past, even before pregnancy and had less during pregnancy, has not used any topical, uncertain if detergent or other allergen  Recent hospitalization / Medical history review Briefly discussed recent hospitalization today, see hospital DC summary and details from [facility] GYN service and was in ICU with Septic thrombophlebitis diagnosis, after kidney stone UTI and had fevers - Since discharge she has improved. Still has lower temp readings 96 at times with a chill - was burning urinary initially, then now improved - She already plans to call GYN to follow-up after discharge - She has apt with Urology for stent/ kidney stone management within 2-3 weeks  Denies any body ache, cough, shortness of breath, sinus pain or pressure, headache, abdominal  pain, diarrhea  Past Medical History:  Diagnosis Date  . Anemia   . Anxiety   . Kidney stone    Social History   Tobacco Use  . Smoking status: Never Smoker  . Smokeless tobacco: Never Used  Substance Use Topics  . Alcohol use: No  . Drug use: No    Current Outpatient Medications:  .  acetaminophen (TYLENOL) 500 MG tablet, Take 2 tablets (1,000 mg total) by mouth every 6 (six) hours as needed for mild pain, moderate pain or headache., Disp: 60 tablet, Rfl: 0 .  ferrous sulfate 325 (65 FE) MG EC tablet, Take 325 mg by mouth daily., Disp: , Rfl:  .  ibuprofen (ADVIL) 600 MG tablet, Take 1 tablet (600 mg total) by mouth every 6 (six) hours as needed for headache, mild pain, moderate pain or cramping., Disp: 60 tablet, Rfl: 0 .  norethindrone (ORTHO MICRONOR) 0.35 MG tablet, Take 1 tablet (0.35 mg total) by mouth daily., Disp: 1 Package, Rfl: 11 .  Prenatal Vit-Fe Fumarate-FA (MULTIVITAMIN-PRENATAL) 27-0.8 MG TABS tablet, Take 1 tablet by mouth daily at 12 noon., Disp: , Rfl:  .  benzocaine-Menthol (DERMOPLAST) 20-0.5 % AERO, Apply 1 application topically as needed for irritation (perineal discomfort). (Patient not taking: Reported on 07/07/2019), Disp: 1 g, Rfl: 1 .  triamcinolone cream (KENALOG) 0.5 %, Apply 1 application topically 2 (two) times daily. To affected areas, for up to 2 weeks., Disp: 30 g, Rfl: 1  Depression screen [facility] 2/9 10/22/2018 06/02/2018 04/08/2018  Decreased Interest 0 0 0  Down, Depressed, Hopeless 0 0 0  PHQ - 2 Score 0 0 0    GAD 7 : Generalized Anxiety Score 04/05/2017 03/04/2017 03/04/2017  Nervous, Anxious, on Edge 1 2 2   Control/stop worrying 1 3 3   Worry too much - different things 1 3 3   Trouble relaxing 0 0 0  Restless 0 0 0  Easily annoyed or irritable 0 0 0  Afraid - awful might happen 0 0 0  Total GAD 7 Score 3 8 8   Anxiety Difficulty Not difficult at all - Somewhat difficult     -------------------------------------------------------------------------- O: No physical exam performed due to remote telephone encounter.  Lab results reviewed.  Recent Results (from the past 2160 hour(s))  Urinalysis, Complete w Microscopic     Status: Abnormal   Collection Time: 04/11/19  6:35 PM  Result Value Ref Range   Color, Urine YELLOW (A) YELLOW   APPearance HAZY (A) CLEAR   Specific Gravity, Urine 1.021 1.005 - 1.030   pH 6.0 5.0 - 8.0   Glucose, UA NEGATIVE NEGATIVE mg/dL   Hgb urine dipstick MODERATE (A) NEGATIVE   Bilirubin Urine NEGATIVE NEGATIVE   Ketones, ur 5 (A) NEGATIVE mg/dL   Protein, ur NEGATIVE NEGATIVE mg/dL   Nitrite NEGATIVE NEGATIVE   Leukocytes,Ua SMALL (A) NEGATIVE   RBC / HPF >50 (H) 0 - 5 RBC/hpf   WBC, UA 11-20 0 - 5 WBC/hpf   Bacteria, UA RARE (A) NONE SEEN   Squamous Epithelial / LPF 6-10 0 - 5   Mucus PRESENT     Comment: Performed at [facility], [address] [place], [place] [address]  Wet prep, genital     Status: Abnormal   Collection Time: 04/11/19  6:35 PM  Result Value Ref Range   Yeast Wet Prep HPF POC NONE SEEN NONE SEEN   Trich, Wet Prep NONE SEEN NONE SEEN   Clue Cells Wet Prep HPF POC NONE SEEN NONE SEEN   WBC, Wet Prep HPF POC FEW (A) NONE SEEN   Sperm NONE SEEN     Comment: Performed at [facility], [address] [place], [place] [address]  Stacy rt PCR [facility] only)     Status: None   Collection Time: 04/11/19  6:35 PM   Specimen: Cervical/Vaginal swab  Result Value Ref Range   Specimen source GC/Chlam ENDOCERVICAL    Chlamydia Tr NOT DETECTED NOT DETECTED   N gonorrhoeae NOT DETECTED NOT DETECTED    Comment: (NOTE) This CT/NG assay has not been evaluated in patients with a history of  hysterectomy. Performed at [facility], [address] [place], [place] [address]   Urine Culture     Status: Abnormal   Collection Time: 04/11/19  7:19 PM   Specimen: Urine, Random   Result Value Ref Range   Specimen Description      URINE, RANDOM Performed at [facility], [address] [place], [place] [address]    Special Requests      NONE Performed at [facility], [place], [place] [address]    Culture (A)     40,000 COLONIES/mL MULTIPLE SPECIES PRESENT, SUGGEST RECOLLECTION   Report Status 04/13/2019 FINAL   Urinalysis, Routine w reflex microscopic     Status: Abnormal   Collection Time: 04/12/19  4:59 PM  Result Value Ref Range   Color, Urine YELLOW (A) YELLOW   APPearance CLOUDY (A) CLEAR   Specific Gravity, Urine 1.020 1.005 - 1.030   pH 5.0 5.0 - 8.0   Glucose, UA NEGATIVE NEGATIVE mg/dL   Hgb urine dipstick LARGE (A) NEGATIVE   Bilirubin Urine NEGATIVE  NEGATIVE   Ketones, ur 80 (A) NEGATIVE mg/dL   Protein, ur 30 (A) NEGATIVE mg/dL   Nitrite NEGATIVE NEGATIVE   Leukocytes,Ua LARGE (A) NEGATIVE   RBC / HPF >50 (H) 0 - 5 RBC/hpf   WBC, UA >50 (H) 0 - 5 WBC/hpf   Bacteria, UA FEW (A) NONE SEEN   Squamous Epithelial / LPF 0-5 0 - 5   WBC Clumps PRESENT    Mucus PRESENT     Comment: Performed at [facility], [address] [place], [place] [address]  SARS Coronavirus 2 (CEPHEID - Performed in [place] hospital lab), Hosp Order     Status: None   Collection Time: 04/12/19  4:59 PM   Specimen: Nasopharyngeal Swab  Result Value Ref Range   SARS Coronavirus 2 NEGATIVE NEGATIVE    Comment: (NOTE) If result is NEGATIVE SARS-CoV-2 target nucleic acids are NOT DETECTED. The SARS-CoV-2 RNA is generally detectable in upper and lower  respiratory specimens during the acute phase of infection. The lowest  concentration of SARS-CoV-2 viral copies this assay can detect is 250  copies / mL. A negative result does not preclude SARS-CoV-2 infection  and should not be used as the sole basis for treatment or other  patient management decisions.  A negative result may occur with  improper specimen collection  / handling, submission of specimen other  than nasopharyngeal swab, presence of viral mutation(s) within the  areas targeted by this assay, and inadequate number of viral copies  (<250 copies / mL). A negative result must be combined with clinical  observations, patient history, and epidemiological information. If result is POSITIVE SARS-CoV-2 target nucleic acids are DETECTED. The SARS-CoV-2 RNA is generally detectable in upper and lower  respiratory specimens dur ing the acute phase of infection.  Positive  results are indicative of active infection with SARS-CoV-2.  Clinical  correlation with patient history and other diagnostic information is  necessary to determine patient infection status.  Positive results do  not rule out bacterial infection or co-infection with other viruses. If result is PRESUMPTIVE POSTIVE SARS-CoV-2 nucleic acids MAY BE PRESENT.   A presumptive positive result was obtained on the submitted specimen  and confirmed on repeat testing.  While 2019 novel coronavirus  (SARS-CoV-2) nucleic acids may be present in the submitted sample  additional confirmatory testing may be necessary for epidemiological  and / or clinical management purposes  to differentiate between  SARS-CoV-2 and other Sarbecovirus currently known to infect humans.  If clinically indicated additional testing with an alternate test  methodology [phone]) is advised. The SARS-CoV-2 RNA is generally  detectable in upper and lower respiratory sp ecimens during the acute  phase of infection. The expected result is Negative. Fact Sheet for Patients:  StrictlyIdeas.no Fact Sheet for Healthcare Providers: BankingDealers.co.za This test is not yet approved or cleared by the [place] FDA and has been authorized for detection and/or diagnosis of SARS-CoV-2 by FDA under an Emergency Use Authorization (EUA).  This EUA will remain in effect (meaning this  test can be used) for the duration of the COVID-19 declaration under Section 564(b)(1) of the Act, 21 U.S.C. section 360bbb-3(b)(1), unless the authorization is terminated or revoked sooner. Performed at [facility], [address] [place], [place] [address]   CBC on admission     Status: Abnormal   Collection Time: 04/12/19  5:30 PM  Result Value Ref Range   WBC 18.0 (H) 4.0 - 10.5 K/uL   RBC 3.23 (  L) 3.87 - 5.11 MIL/uL   Hemoglobin 10.2 (L) 12.0 - 15.0 g/dL   HCT 29.3 (L) 36.0 - 46.0 %   MCV 90.7 80.0 - 100.0 fL   MCH 31.6 26.0 - 34.0 pg   MCHC 34.8 30.0 - 36.0 g/dL   RDW 11.9 11.5 - 15.5 %   Platelets 166 150 - 400 K/uL   nRBC 0.0 0.0 - 0.2 %    Comment: Performed at [facility], [address] [place], [place] [address]  Comprehensive metabolic panel     Status: Abnormal   Collection Time: 04/12/19  5:30 PM  Result Value Ref Range   Sodium 132 (L) 135 - 145 mmol/L   Potassium 3.5 3.5 - 5.1 mmol/L   Chloride 104 98 - 111 mmol/L   CO2 18 (L) 22 - 32 mmol/L   Glucose, Bld 95 70 - 99 mg/dL   BUN 9 6 - 20 mg/dL   Creatinine, Ser 0.70 0.44 - 1.00 mg/dL   Calcium 8.3 (L) 8.9 - 10.3 mg/dL   Total Protein 6.5 6.5 - 8.1 g/dL   Albumin 3.3 (L) 3.5 - 5.0 g/dL   AST 17 15 - 41 U/L   ALT 13 0 - 44 U/L   Alkaline Phosphatase 93 38 - 126 U/L   Total Bilirubin 0.7 0.3 - 1.2 mg/dL   GFR calc non Af Amer >60 >60 mL/min   GFR calc Af Amer >60 >60 mL/min   Anion gap 10 5 - 15    Comment: Performed at [facility], [address] [place], [place] [address]  Sample to Blood Bank     Status: None   Collection Time: 04/12/19  5:31 PM  Result Value Ref Range   Blood Bank Specimen SAMPLE AVAILABLE FOR TESTING    Sample Expiration      04/15/2019,2359 Performed at [facility], [address] [place], [place] [address]   CBC with Differential/Platelet     Status: Abnormal   Collection Time: 04/13/19  6:12 AM  Result Value Ref Range   WBC 10.6  (H) 4.0 - 10.5 K/uL   RBC 2.86 (L) 3.87 - 5.11 MIL/uL   Hemoglobin 8.8 (L) 12.0 - 15.0 g/dL   HCT 26.4 (L) 36.0 - 46.0 %   MCV 92.3 80.0 - 100.0 fL   MCH 30.8 26.0 - 34.0 pg   MCHC 33.3 30.0 - 36.0 g/dL   RDW 11.9 11.5 - 15.5 %   Platelets 167 150 - 400 K/uL   nRBC 0.0 0.0 - 0.2 %   Neutrophils Relative % 79 %   Neutro Abs 8.4 (H) 1.7 - 7.7 K/uL   Lymphocytes Relative 12 %   Lymphs Abs 1.3 0.7 - 4.0 K/uL   Monocytes Relative 7 %   Monocytes Absolute 0.8 0.1 - 1.0 K/uL   Eosinophils Relative 1 %   Eosinophils Absolute 0.1 0.0 - 0.5 K/uL   Basophils Relative 0 %   Basophils Absolute 0.0 0.0 - 0.1 K/uL   Immature Granulocytes 1 %   Abs Immature Granulocytes 0.05 0.00 - 0.07 K/uL    Comment: Performed at [facility], [address] [place], [place] [address]  Comprehensive metabolic panel     Status: Abnormal   Collection Time: 04/13/19  6:12 AM  Result Value Ref Range   Sodium 135 135 - 145 mmol/L   Potassium 3.4 (L) 3.5 - 5.1 mmol/L   Chloride 107 98 - 111 mmol/L   CO2 23  22 - 32 mmol/L   Glucose, Bld 102 (H) 70 - 99 mg/dL   BUN 6 6 - 20 mg/dL   Creatinine, Ser 0.59 0.44 - 1.00 mg/dL   Calcium 7.9 (L) 8.9 - 10.3 mg/dL   Total Protein 5.5 (L) 6.5 - 8.1 g/dL   Albumin 2.6 (L) 3.5 - 5.0 g/dL   AST 15 15 - 41 U/L   ALT 11 0 - 44 U/L   Alkaline Phosphatase 80 38 - 126 U/L   Total Bilirubin 0.4 0.3 - 1.2 mg/dL   GFR calc non Af Amer >60 >60 mL/min   GFR calc Af Amer >60 >60 mL/min   Anion gap 5 5 - 15    Comment: Performed at [facility], [address] [place], [place] [address]  Calculi, with Photograph     Status: None   Collection Time: 04/13/19 12:50 PM  Result Value Ref Range   Source Calculi Comment     Comment: Not provided   Color Calculi White    Size Calculi 3x3 mm    Comment: Single piece received.   Weight Calculi 10 mg   Composition Calculi Comment     Comment: Percentage (Represents the % composition)   Calcium Oxalate Dihydrate 60  %   Hydroxyapatite 40 %   Photo Calculi Comment     Comment: Photograph will follow under a separate cover   Comment Calculi 3 Comment     Comment: (NOTE) Physician questions regarding Calculi Analysis contact [facility] at: [phone].    Please Note: Comment     Comment: (NOTE) Calculi report will follow via computer, mail or courier delivery.    DISCLAIMER: Comment     Comment: (NOTE) This test was developed and its performance characteristics determined by [facility].  It has not been cleared or approved by the Food and Drug Administration. Performed At: [facility] [place], [place] [id number] Thomasene Ripple MD [id number]   Comprehensive metabolic panel     Status: Abnormal   Collection Time: 06/15/19  3:15 PM  Result Value Ref Range   Sodium 136 135 - 145 mmol/L   Potassium 3.9 3.5 - 5.1 mmol/L   Chloride 104 98 - 111 mmol/L   CO2 23 22 - 32 mmol/L   Glucose, Bld 85 70 - 99 mg/dL   BUN <5 (L) 6 - 20 mg/dL   Creatinine, Ser 0.46 0.44 - 1.00 mg/dL   Calcium 8.9 8.9 - 10.3 mg/dL   Total Protein 6.5 6.5 - 8.1 g/dL   Albumin 2.8 (L) 3.5 - 5.0 g/dL   AST 11 (L) 15 - 41 U/L   ALT 9 0 - 44 U/L   Alkaline Phosphatase 228 (H) 38 - 126 U/L   Total Bilirubin 0.4 0.3 - 1.2 mg/dL   GFR calc non Af Amer >60 >60 mL/min   GFR calc Af Amer >60 >60 mL/min   Anion gap 9 5 - 15    Comment: Performed at [facility], [address] [place], [place] [address]  CBC     Status: Abnormal   Collection Time: 06/15/19  3:15 PM  Result Value Ref Range   WBC 8.4 4.0 - 10.5 K/uL   RBC 3.32 (L) 3.87 - 5.11 MIL/uL   Hemoglobin 9.6 (L) 12.0 - 15.0 g/dL   HCT 29.2 (L) 36.0 - 46.0 %   MCV 88.0 80.0 - 100.0 fL   MCH 28.9 26.0 - 34.0 pg  MCHC 32.9 30.0 - 36.0 g/dL   RDW 13.2 11.5 - 15.5 %   Platelets 200 150 - 400 K/uL   nRBC 0.0 0.0 - 0.2 %    Comment: Performed at [facility], [address] [place], [place] [address]  TSH      Status: None   Collection Time: 06/15/19  3:15 PM  Result Value Ref Range   TSH 1.738 0.350 - 4.500 uIU/mL    Comment: Performed by a 3rd Generation assay with a functional sensitivity of <=0.01 uIU/mL. Performed at [facility], [address] [place], [place] [address]   T4, free     Status: None   Collection Time: 06/15/19  3:15 PM  Result Value Ref Range   Free T4 0.77 0.61 - 1.12 ng/dL    Comment: (NOTE) Biotin ingestion may interfere with free T4 tests. If the results are inconsistent with the TSH level, previous test results, or the clinical presentation, then consider biotin interference. If needed, order repeat testing after stopping biotin. Performed at [facility], [address] [place], [place] [address]   Sample to Blood Bank     Status: None   Collection Time: 06/15/19  3:15 PM  Result Value Ref Range   Blood Bank Specimen SAMPLE AVAILABLE FOR TESTING    Sample Expiration      06/18/2019,2359 Performed at [facility], [address] [place], [place] [address]   Type and screen [facility]     Status: None   Collection Time: 06/15/19  3:15 PM  Result Value Ref Range   ABO/RH(D) O POS    Antibody Screen NEG    Sample Expiration      06/18/2019,2359 Performed at [facility], [address] [place], [place] [address]   RPR     Status: None   Collection Time: 06/15/19  3:15 PM  Result Value Ref Range   RPR Ser Ql Non Reactive Non Reactive    Comment: (NOTE) Performed At: [facility] [address] [place], [place] [id number] Rush Farmer MD [id number]   Protein / creatinine ratio, urine     Status: Abnormal   Collection Time: 06/15/19  3:37 PM  Result Value Ref Range   Creatinine, Urine 113 mg/dL   Total Protein, Urine 450 mg/dL    Comment: RESULT CONFIRMED BY MANUAL DILUTION/TFK NO NORMAL RANGE ESTABLISHED FOR THIS TEST    Protein Creatinine Ratio 3.98 (H) 0.00 - 0.15  mg/mg[Cre]    Comment: Performed at [facility], [address] [place], [place] [address]  SARS Coronavirus 2 [facility] order, Performed in [facility] hospital lab) Nasopharyngeal Nasopharyngeal Swab     Status: None   Collection Time: 06/15/19  6:46 PM   Specimen: Nasopharyngeal Swab  Result Value Ref Range   SARS Coronavirus 2 NEGATIVE NEGATIVE    Comment: (NOTE) If result is NEGATIVE SARS-CoV-2 target nucleic acids are NOT DETECTED. The SARS-CoV-2 RNA is generally detectable in upper and lower  respiratory specimens during the acute phase of infection. The lowest  concentration of SARS-CoV-2 viral copies this assay can detect is 250  copies / mL. A negative result does not preclude SARS-CoV-2 infection  and should not be used as the sole basis for treatment or other  patient management decisions.  A negative result may occur with  improper specimen collection / handling, submission of specimen other  than nasopharyngeal swab, presence of viral mutation(s) within the  areas targeted by  this assay, and inadequate number of viral copies  (<250 copies / mL). A negative result must be combined with clinical  observations, patient history, and epidemiological information. If result is POSITIVE SARS-CoV-2 target nucleic acids are DETECTED. The SARS-CoV-2 RNA is generally detectable in upper and lower  respiratory specimens dur ing the acute phase of infection.  Positive  results are indicative of active infection with SARS-CoV-2.  Clinical  correlation with patient history and other diagnostic information is  necessary to determine patient infection status.  Positive results do  not rule out bacterial infection or co-infection with other viruses. If result is PRESUMPTIVE POSTIVE SARS-CoV-2 nucleic acids MAY BE PRESENT.   A presumptive positive result was obtained on the submitted specimen  and confirmed on repeat testing.  While 2019 novel coronavirus  (SARS-CoV-2) nucleic  acids may be present in the submitted sample  additional confirmatory testing may be necessary for epidemiological  and / or clinical management purposes  to differentiate between  SARS-CoV-2 and other Sarbecovirus currently known to infect humans.  If clinically indicated additional testing with an alternate test  methodology [phone]) is advised. The SARS-CoV-2 RNA is generally  detectable in upper and lower respiratory sp ecimens during the acute  phase of infection. The expected result is Negative. Fact Sheet for Patients:  StrictlyIdeas.no Fact Sheet for Healthcare Providers: BankingDealers.co.za This test is not yet approved or cleared by the [place] FDA and has been authorized for detection and/or diagnosis of SARS-CoV-2 by FDA under an Emergency Use Authorization (EUA).  This EUA will remain in effect (meaning this test can be used) for the duration of the COVID-19 declaration under Section 564(b)(1) of the Act, 21 U.S.C. section 360bbb-3(b)(1), unless the authorization is terminated or revoked sooner. Performed at [facility], [address] [place], [place] [address]   Magnesium     Status: Abnormal   Collection Time: 06/16/19  6:38 AM  Result Value Ref Range   Magnesium 4.0 (H) 1.7 - 2.4 mg/dL    Comment: Performed at [facility], [address] [place], [place] [address]  CBC     Status: Abnormal   Collection Time: 06/17/19  7:23 AM  Result Value Ref Range   WBC 9.9 4.0 - 10.5 K/uL   RBC 3.04 (L) 3.87 - 5.11 MIL/uL   Hemoglobin 8.6 (L) 12.0 - 15.0 g/dL   HCT 26.7 (L) 36.0 - 46.0 %   MCV 87.8 80.0 - 100.0 fL   MCH 28.3 26.0 - 34.0 pg   MCHC 32.2 30.0 - 36.0 g/dL   RDW 13.1 11.5 - 15.5 %   Platelets 163 150 - 400 K/uL   nRBC 0.0 0.0 - 0.2 %    Comment: Performed at [facility], [address] [place], [place] [address]  CBC     Status: Abnormal   Collection Time: 06/18/19  7:17  AM  Result Value Ref Range   WBC 9.8 4.0 - 10.5 K/uL   RBC 2.97 (L) 3.87 - 5.11 MIL/uL   Hemoglobin 8.3 (L) 12.0 - 15.0 g/dL   HCT 26.2 (L) 36.0 - 46.0 %   MCV 88.2 80.0 - 100.0 fL   MCH 27.9 26.0 - 34.0 pg   MCHC 31.7 30.0 - 36.0 g/dL   RDW 13.2 11.5 - 15.5 %   Platelets 182 150 - 400 K/uL   nRBC 0.0 0.0 - 0.2 %    Comment: Performed at [facility], [address], [place], [place]  [address]  Comprehensive metabolic panel     Status: Abnormal   Collection Time: 06/18/19  7:17 AM  Result Value Ref Range   Sodium 137 135 - 145 mmol/L   Potassium 4.0 3.5 - 5.1 mmol/L   Chloride 105 98 - 111 mmol/L   CO2 22 22 - 32 mmol/L   Glucose, Bld 99 70 - 99 mg/dL   BUN 6 6 - 20 mg/dL   Creatinine, Ser 0.63 0.44 - 1.00 mg/dL   Calcium 8.2 (L) 8.9 - 10.3 mg/dL   Total Protein 5.4 (L) 6.5 - 8.1 g/dL   Albumin 2.1 (L) 3.5 - 5.0 g/dL   AST 22 15 - 41 U/L   ALT 11 0 - 44 U/L   Alkaline Phosphatase 151 (H) 38 - 126 U/L   Total Bilirubin 0.3 0.3 - 1.2 mg/dL   GFR calc non Af Amer >60 >60 mL/min   GFR calc Af Amer >60 >60 mL/min   Anion gap 10 5 - 15    Comment: Performed at [facility], [address] [place], [place] [address]  Comprehensive metabolic panel     Status: Abnormal   Collection Time: 06/18/19  6:34 PM  Result Value Ref Range   Sodium 132 (L) 135 - 145 mmol/L   Potassium 3.1 (L) 3.5 - 5.1 mmol/L   Chloride 102 98 - 111 mmol/L   CO2 21 (L) 22 - 32 mmol/L   Glucose, Bld 107 (H) 70 - 99 mg/dL   BUN 6 6 - 20 mg/dL   Creatinine, Ser 0.62 0.44 - 1.00 mg/dL   Calcium 8.0 (L) 8.9 - 10.3 mg/dL   Total Protein 5.6 (L) 6.5 - 8.1 g/dL   Albumin 2.4 (L) 3.5 - 5.0 g/dL   AST 24 15 - 41 U/L   ALT 13 0 - 44 U/L   Alkaline Phosphatase 140 (H) 38 - 126 U/L   Total Bilirubin 0.4 0.3 - 1.2 mg/dL   GFR calc non Af Amer >60 >60 mL/min   GFR calc Af Amer >60 >60 mL/min   Anion gap 9 5 - 15    Comment: Performed at [facility], [address] [place],  [place] [address]  Lactic acid, plasma     Status: Abnormal   Collection Time: 06/18/19  6:34 PM  Result Value Ref Range   Lactic Acid, Venous 2.1 (HH) 0.5 - 1.9 mmol/L    Comment: CRITICAL RESULT CALLED TO, READ BACK BY AND VERIFIED WITH JESSICA FULCHER 06/18/19 @ 1919  MLK Performed at [facility], [address] [place], [place] [address]   CBC with Differential     Status: Abnormal   Collection Time: 06/18/19  6:34 PM  Result Value Ref Range   WBC 9.0 4.0 - 10.5 K/uL   RBC 2.85 (L) 3.87 - 5.11 MIL/uL   Hemoglobin 8.1 (L) 12.0 - 15.0 g/dL   HCT 25.0 (L) 36.0 - 46.0 %   MCV 87.7 80.0 - 100.0 fL   MCH 28.4 26.0 - 34.0 pg   MCHC 32.4 30.0 - 36.0 g/dL   RDW 13.2 11.5 - 15.5 %   Platelets 151 150 - 400 K/uL   nRBC 0.0 0.0 - 0.2 %   Neutrophils Relative % 88 %   Neutro Abs 8.0 (H) 1.7 - 7.7 K/uL   Lymphocytes Relative 5 %   Lymphs Abs 0.5 (L) 0.7 - 4.0 K/uL   Monocytes Relative 6 %   Monocytes Absolute 0.5 0.1 -  1.0 K/uL   Eosinophils Relative 0 %   Eosinophils Absolute 0.0 0.0 - 0.5 K/uL   Basophils Relative 0 %   Basophils Absolute 0.0 0.0 - 0.1 K/uL   Immature Granulocytes 1 %   Abs Immature Granulocytes 0.09 (H) 0.00 - 0.07 K/uL    Comment: Performed at [facility], [address], [place], [place] [address]  Uric acid     Status: None   Collection Time: 06/18/19  6:34 PM  Result Value Ref Range   Uric Acid, Serum 3.1 2.5 - 7.1 mg/dL    Comment: Performed at [facility], [address] [place], [place] [address]  Urinalysis, Complete w Microscopic     Status: Abnormal   Collection Time: 06/18/19  6:35 PM  Result Value Ref Range   Color, Urine YELLOW (A) YELLOW   APPearance CLOUDY (A) CLEAR   Specific Gravity, Urine 1.017 1.005 - 1.030   pH 6.0 5.0 - 8.0   Glucose, UA NEGATIVE NEGATIVE mg/dL   Hgb urine dipstick LARGE (A) NEGATIVE   Bilirubin Urine NEGATIVE NEGATIVE   Ketones, ur 5 (A) NEGATIVE mg/dL   Protein, ur 100 (A) NEGATIVE mg/dL    Nitrite NEGATIVE NEGATIVE   Leukocytes,Ua MODERATE (A) NEGATIVE   RBC / HPF >50 (H) 0 - 5 RBC/hpf   WBC, UA >50 (H) 0 - 5 WBC/hpf   Bacteria, UA RARE (A) NONE SEEN   Squamous Epithelial / LPF 0-5 0 - 5   Mucus PRESENT     Comment: Performed at [facility], [address] [place], [place] [address]  Culture, blood (routine x 2)     Status: None   Collection Time: 06/18/19  6:35 PM   Specimen: BLOOD  Result Value Ref Range   Specimen Description BLOOD RIGHT ANTECUBITAL    Special Requests      BOTTLES DRAWN AEROBIC AND ANAEROBIC Blood Culture adequate volume   Culture      NO GROWTH 5 DAYS Performed at [facility], [address] [place], [place] [address]    Report Status 06/23/2019 FINAL   Culture, blood (routine x 2)     Status: None   Collection Time: 06/18/19  6:35 PM   Specimen: BLOOD  Result Value Ref Range   Specimen Description BLOOD BLOOD LEFT WRIST    Special Requests      BOTTLES DRAWN AEROBIC AND ANAEROBIC Blood Culture adequate volume   Culture      NO GROWTH 5 DAYS Performed at [facility], [address] [place], [place] [address]    Report Status 06/23/2019 FINAL   Protein / creatinine ratio, urine     Status: Abnormal   Collection Time: 06/18/19  6:35 PM  Result Value Ref Range   Creatinine, Urine 178 mg/dL   Total Protein, Urine 88 mg/dL    Comment: NO NORMAL RANGE ESTABLISHED FOR THIS TEST   Protein Creatinine Ratio 0.49 (H) 0.00 - 0.15 mg/mg[Cre]    Comment: Performed at [facility], [address] [place], [place] [address]  Urine Culture     Status: Abnormal   Collection Time: 06/18/19  6:35 PM   Specimen: Urine, Random  Result Value Ref Range   Specimen Description      URINE, RANDOM Performed at [facility], [address] [place], [place] [address]    Special Requests      NONE Performed at [facility], [address], [place], [place] [address]  Culture (A)      <10,000 COLONIES/mL INSIGNIFICANT GROWTH Performed at [facility] [address] [place], [place] [address]    Report Status 06/20/2019 FINAL   SARS Coronavirus 2 [facility] order, Performed in [facility] hospital lab) Nasopharyngeal Nasopharyngeal Swab     Status: None   Collection Time: 06/18/19  7:18 PM   Specimen: Nasopharyngeal Swab  Result Value Ref Range   SARS Coronavirus 2 NEGATIVE NEGATIVE    Comment: (NOTE) If result is NEGATIVE SARS-CoV-2 target nucleic acids are NOT DETECTED. The SARS-CoV-2 RNA is generally detectable in upper and lower  respiratory specimens during the acute phase of infection. The lowest  concentration of SARS-CoV-2 viral copies this assay can detect is 250  copies / mL. A negative result does not preclude SARS-CoV-2 infection  and should not be used as the sole basis for treatment or other  patient management decisions.  A negative result may occur with  improper specimen collection / handling, submission of specimen other  than nasopharyngeal swab, presence of viral mutation(s) within the  areas targeted by this assay, and inadequate number of viral copies  (<250 copies / mL). A negative result must be combined with clinical  observations, patient history, and epidemiological information. If result is POSITIVE SARS-CoV-2 target nucleic acids are DETECTED. The SARS-CoV-2 RNA is generally detectable in upper and lower  respiratory specimens dur ing the acute phase of infection.  Positive  results are indicative of active infection with SARS-CoV-2.  Clinical  correlation with patient history and other diagnostic information is  necessary to determine patient infection status.  Positive results do  not rule out bacterial infection or co-infection with other viruses. If result is PRESUMPTIVE POSTIVE SARS-CoV-2 nucleic acids MAY BE PRESENT.   A presumptive positive result was obtained on the submitted specimen  and confirmed on repeat testing.   While 2019 novel coronavirus  (SARS-CoV-2) nucleic acids may be present in the submitted sample  additional confirmatory testing may be necessary for epidemiological  and / or clinical management purposes  to differentiate between  SARS-CoV-2 and other Sarbecovirus currently known to infect humans.  If clinically indicated additional testing with an alternate test  methodology [phone] is advised. The SARS-CoV-2 RNA is generally  detectable in upper and lower respiratory sp ecimens during the acute  phase of infection. The expected result is Negative. Fact Sheet for Patients:  StrictlyIdeas.no Fact Sheet for Healthcare Providers: BankingDealers.co.za This test is not yet approved or cleared by the [place] FDA and has been authorized for detection and/or diagnosis of SARS-CoV-2 by FDA under an Emergency Use Authorization (EUA).  This EUA will remain in effect (meaning this test can be used) for the duration of the COVID-19 declaration under Section 564(b)(1) of the Act, 21 U.S.C. section 360bbb-3(b)(1), unless the authorization is terminated or revoked sooner. Performed at [facility], [address] [place], [place] [address]   Lactic acid, plasma     Status: None   Collection Time: 06/18/19  8:58 PM  Result Value Ref Range   Lactic Acid, Venous 0.8 0.5 - 1.9 mmol/L    Comment: Performed at [facility], [address] [place], [place] [address]  Type and screen [place]     Status: None   Collection Time: 06/18/19  9:59 PM  Result Value Ref Range   ABO/RH(D) O POS    Antibody Screen NEG    Sample Expiration 06/21/2019,2359    Unit Number  [id number]    Blood Component Type RED CELLS,LR    Unit division 00    Status of Unit ISSUED,FINAL    Transfusion Status OK TO TRANSFUSE    Crossmatch Result      Compatible Performed at [facility], [address]  [place], [place] [address]   BPAM RBC     Status: None   Collection Time: 06/18/19  9:59 PM  Result Value Ref Range   ISSUE DATE / TIME [id number]    Blood Product Unit Number [id number]    PRODUCT CODE [id number]    Unit Type and Rh 5100    Blood Product Expiration Date [id number]   CBC on admission     Status: Abnormal   Collection Time: 06/19/19  4:45 AM  Result Value Ref Range   WBC 7.3 4.0 - 10.5 K/uL   RBC 2.53 (L) 3.87 - 5.11 MIL/uL   Hemoglobin 7.1 (L) 12.0 - 15.0 g/dL   HCT 22.3 (L) 36.0 - 46.0 %   MCV 88.1 80.0 - 100.0 fL   MCH 28.1 26.0 - 34.0 pg   MCHC 31.8 30.0 - 36.0 g/dL   RDW 13.3 11.5 - 15.5 %   Platelets 142 (L) 150 - 400 K/uL   nRBC 0.0 0.0 - 0.2 %    Comment: Performed at [facility], [address] [place], [place] [address]  Comprehensive metabolic panel     Status: Abnormal   Collection Time: 06/19/19  4:45 AM  Result Value Ref Range   Sodium 135 135 - 145 mmol/L   Potassium 3.7 3.5 - 5.1 mmol/L   Chloride 109 98 - 111 mmol/L   CO2 20 (L) 22 - 32 mmol/L   Glucose, Bld 115 (H) 70 - 99 mg/dL   BUN 7 6 - 20 mg/dL   Creatinine, Ser 0.49 0.44 - 1.00 mg/dL   Calcium 7.8 (L) 8.9 - 10.3 mg/dL   Total Protein 5.0 (L) 6.5 - 8.1 g/dL   Albumin 2.0 (L) 3.5 - 5.0 g/dL   AST 28 15 - 41 U/L   ALT 15 0 - 44 U/L   Alkaline Phosphatase 122 38 - 126 U/L   Total Bilirubin 0.5 0.3 - 1.2 mg/dL   GFR calc non Af Amer >60 >60 mL/min   GFR calc Af Amer >60 >60 mL/min   Anion gap 6 5 - 15    Comment: Performed at [facility], [address] [place], [place] [address]  hCG, quantitative, pregnancy     Status: Abnormal   Collection Time: 06/19/19  4:45 AM  Result Value Ref Range   hCG, Beta Chain, Quant, S 1,263 (H) <5 mIU/mL    Comment:          GEST. AGE      CONC.  (mIU/mL)   <=1 WEEK        5 - 50     2 WEEKS       50 - 500     3 WEEKS       100 - 10,000     4 WEEKS     1,000 - 30,000     5 WEEKS     3,500 - 115,000   6-8 WEEKS     12,000  - 270,000    12 WEEKS     15,000 - 220,000        FEMALE AND NON-PREGNANT FEMALE:     LESS THAN 5 mIU/mL Performed at [facility], [place], [place] [address]   Prepare RBC     Status: None   Collection Time: 06/19/19  9:15 AM  Result Value Ref Range   Order Confirmation      ORDER PROCESSED BY BLOOD BANK Performed at [facility], [place], [place] [address]   Troponin I (High Sensitivity)     Status: None   Collection Time: 06/19/19  9:47 AM  Result Value Ref Range   Troponin I (High Sensitivity) 4 <18 ng/L    Comment: (NOTE) Elevated high sensitivity troponin I (hsTnI) values and significant  changes across serial measurements may suggest ACS but many other  chronic and acute conditions are known to elevate hsTnI results.  Refer to the "Links" section for chest pain algorithms and additional  guidance. Performed at [facility], [place], [place] [address]   Glucose, capillary     Status: Abnormal   Collection Time: 06/19/19 12:49 PM  Result Value Ref Range   Glucose-Capillary 58 (L) 70 - 99 mg/dL  Glucose, capillary     Status: None   Collection Time: 06/19/19  1:05 PM  Result Value Ref Range   Glucose-Capillary 76 70 - 99 mg/dL  Glucose, capillary     Status: Abnormal   Collection Time: 06/19/19  1:44 PM  Result Value Ref Range   Glucose-Capillary 106 (H) 70 - 99 mg/dL  Protime-INR     Status: Abnormal   Collection Time: 06/19/19  3:04 PM  Result Value Ref Range   Prothrombin Time 15.3 (H) 11.4 - 15.2 seconds   INR 1.2 0.8 - 1.2    Comment: (NOTE) INR goal varies based on device and disease states. Performed at [facility], [address] [place], [place] [address]   Fibrinogen     Status: Abnormal   Collection Time: 06/19/19  3:04 PM  Result Value Ref Range   Fibrinogen 630 (H) 210 - 475 mg/dL    Comment: Performed at [facility], [address]  [place], [place] [address]  Lactic acid, plasma     Status: None   Collection Time: 06/19/19  3:04 PM  Result Value Ref Range   Lactic Acid, Venous 1.3 0.5 - 1.9 mmol/L    Comment: Performed at [facility], [address] [place], [place] [address]  Comprehensive metabolic panel     Status: Abnormal   Collection Time: 06/19/19  3:04 PM  Result Value Ref Range   Sodium 137 135 - 145 mmol/L   Potassium 3.2 (L) 3.5 - 5.1 mmol/L   Chloride 109 98 - 111 mmol/L   CO2 20 (L) 22 - 32 mmol/L   Glucose, Bld 97 70 - 99 mg/dL   BUN 7 6 - 20 mg/dL   Creatinine, Ser 0.50 0.44 - 1.00 mg/dL   Calcium 7.9 (L) 8.9 - 10.3 mg/dL   Total Protein 5.2 (L) 6.5 - 8.1 g/dL   Albumin 2.2 (L) 3.5 - 5.0 g/dL   AST 48 (H) 15 - 41 U/L   ALT 25 0 - 44 U/L   Alkaline Phosphatase 112 38 - 126 U/L   Total Bilirubin 0.4 0.3 - 1.2 mg/dL   GFR calc non Af Amer >60 >60 mL/min   GFR calc Af Amer >60 >60 mL/min   Anion gap 8 5 - 15    Comment: Performed at [facility], [address], [place],  [address]  CBC  Status: Abnormal   Collection Time: 06/19/19  3:04 PM  Result Value Ref Range   WBC 7.9 4.0 - 10.5 K/uL   RBC 3.02 (L) 3.87 - 5.11 MIL/uL   Hemoglobin 8.6 (L) 12.0 - 15.0 g/dL   HCT 26.1 (L) 36.0 - 46.0 %   MCV 86.4 80.0 - 100.0 fL   MCH 28.5 26.0 - 34.0 pg   MCHC 33.0 30.0 - 36.0 g/dL   RDW 13.7 11.5 - 15.5 %   Platelets 139 (L) 150 - 400 K/uL   nRBC 0.0 0.0 - 0.2 %    Comment: Performed at [facility], [address] [place], [place] [address]  Procalcitonin - Baseline     Status: None   Collection Time: 06/19/19  3:04 PM  Result Value Ref Range   Procalcitonin 4.18 ng/mL    Comment:        Interpretation: PCT > 2 ng/mL: Systemic infection (sepsis) is likely, unless other causes are known. (NOTE)       Sepsis PCT Algorithm           Lower Respiratory Tract                                      Infection PCT Algorithm    ----------------------------      ----------------------------         PCT < 0.25 ng/mL                PCT < 0.10 ng/mL         Strongly encourage             Strongly discourage   discontinuation of antibiotics    initiation of antibiotics    ----------------------------     -----------------------------       PCT 0.25 - 0.50 ng/mL            PCT 0.10 - 0.25 ng/mL               OR       >80% decrease in PCT            Discourage initiation of                                            antibiotics      Encourage discontinuation           of antibiotics    ----------------------------     -----------------------------         PCT >= 0.50 ng/mL              PCT 0.26 - 0.50 ng/mL               AND       <80% decrease in PCT              Encourage initiation of                                             antibiotics       Encourage continuation           of antibiotics    ----------------------------     -----------------------------  PCT >= 0.50 ng/mL                  PCT > 0.50 ng/mL               AND         increase in PCT                  Strongly encourage                                      initiation of antibiotics    Strongly encourage escalation           of antibiotics                                     -----------------------------                                           PCT <= 0.25 ng/mL                                                 OR                                        > 80% decrease in PCT                                     Discontinue / Do not initiate                                             antibiotics Performed at [facility], [address] [place], [place] [address]   CBC     Status: Abnormal   Collection Time: 06/19/19  9:10 PM  Result Value Ref Range   WBC 11.2 (H) 4.0 - 10.5 K/uL   RBC 2.95 (L) 3.87 - 5.11 MIL/uL   Hemoglobin 8.4 (L) 12.0 - 15.0 g/dL   HCT 25.7 (L) 36.0 - 46.0 %   MCV 87.1 80.0 - 100.0 fL   MCH 28.5 26.0 - 34.0 pg   MCHC 32.7 30.0 - 36.0  g/dL   RDW 13.9 11.5 - 15.5 %   Platelets 133 (L) 150 - 400 K/uL   nRBC 0.0 0.0 - 0.2 %    Comment: Performed at [facility], [address] [place], [place] [id number]  Basic metabolic panel     Status: Abnormal   Collection Time: 06/19/19  9:10 PM  Result Value Ref Range   Sodium 136 135 - 145 mmol/L   Potassium 3.3 (L) 3.5 - 5.1 mmol/L   Chloride 108 98 - 111 mmol/L   CO2 21 (L) 22 - 32 mmol/L   Glucose, Bld 114 (H) 70 - 99 mg/dL   BUN 8 6 - 20  mg/dL   Creatinine, Ser 0.51 0.44 - 1.00 mg/dL   Calcium 7.9 (L) 8.9 - 10.3 mg/dL   GFR calc non Af Amer >60 >60 mL/min   GFR calc Af Amer >60 >60 mL/min   Anion gap 7 5 - 15    Comment: Performed at [facility], [address], [place], [place] [address]  Magnesium     Status: Abnormal   Collection Time: 06/19/19  9:10 PM  Result Value Ref Range   Magnesium 1.4 (L) 1.7 - 2.4 mg/dL    Comment: Performed at [facility], [address], [place], [place] [address]  Phosphorus     Status: None   Collection Time: 06/19/19  9:10 PM  Result Value Ref Range   Phosphorus 3.6 2.5 - 4.6 mg/dL    Comment: Performed at [facility], [address] [place],  [address]  Procalcitonin     Status: None   Collection Time: 06/20/19  5:09 AM  Result Value Ref Range   Procalcitonin 36.56 ng/mL    Comment:        Interpretation: PCT >= 10 ng/mL: Important systemic inflammatory response, almost exclusively due to severe bacterial sepsis or septic shock. (NOTE)       Sepsis PCT Algorithm           Lower Respiratory Tract                                      Infection PCT Algorithm    ----------------------------     ----------------------------         PCT < 0.25 ng/mL                PCT < 0.10 ng/mL         Strongly encourage             Strongly discourage   discontinuation of antibiotics    initiation of antibiotics    ----------------------------     -----------------------------       PCT 0.25  - 0.50 ng/mL            PCT 0.10 - 0.25 ng/mL               OR       >80% decrease in PCT            Discourage initiation of                                            antibiotics      Encourage discontinuation           of antibiotics    ----------------------------     -----------------------------         PCT >= 0.50 ng/mL              PCT 0.26 - 0.50 ng/mL                AND       <80% decrease in PCT             Encourage initiation of  antibiotics       Encourage continuation           of antibiotics    ----------------------------     -----------------------------        PCT >= 0.50 ng/mL                  PCT > 0.50 ng/mL               AND         increase in PCT                  Strongly encourage                                      initiation of antibiotics    Strongly encourage escalation           of antibiotics                                     -----------------------------                                           PCT <= 0.25 ng/mL                                                 OR                                        > 80% decrease in PCT                                     Discontinue / Do not initiate                                             antibiotics Performed at [facility], [address] [place], [place] [address]   CBC     Status: Abnormal   Collection Time: 06/20/19  5:09 AM  Result Value Ref Range   WBC 8.1 4.0 - 10.5 K/uL   RBC 2.79 (L) 3.87 - 5.11 MIL/uL   Hemoglobin 7.9 (L) 12.0 - 15.0 g/dL   HCT 24.6 (L) 36.0 - 46.0 %   MCV 88.2 80.0 - 100.0 fL   MCH 28.3 26.0 - 34.0 pg   MCHC 32.1 30.0 - 36.0 g/dL   RDW 14.0 11.5 - 15.5 %   Platelets 133 (L) 150 - 400 K/uL   nRBC 0.0 0.0 - 0.2 %    Comment: Performed at [facility], [address] [place], [place] [address]  Comprehensive metabolic panel     Status: Abnormal   Collection Time: 06/20/19  5:09 AM  Result Value Ref  Range   Sodium 136 135 - 145 mmol/L   Potassium 4.0 3.5 - 5.1  mmol/L   Chloride 108 98 - 111 mmol/L   CO2 21 (L) 22 - 32 mmol/L   Glucose, Bld 91 70 - 99 mg/dL   BUN 10 6 - 20 mg/dL   Creatinine, Ser 0.48 0.44 - 1.00 mg/dL   Calcium 8.2 (L) 8.9 - 10.3 mg/dL   Total Protein 5.0 (L) 6.5 - 8.1 g/dL   Albumin 2.0 (L) 3.5 - 5.0 g/dL   AST 37 15 - 41 U/L   ALT 25 0 - 44 U/L   Alkaline Phosphatase 104 38 - 126 U/L   Total Bilirubin 0.4 0.3 - 1.2 mg/dL   GFR calc non Af Amer >60 >60 mL/min   GFR calc Af Amer >60 >60 mL/min   Anion gap 7 5 - 15    Comment: Performed at [facility], [address] [place], [place] [address]  ECHOCARDIOGRAM COMPLETE     Status: None   Collection Time: 06/20/19  2:50 PM  Result Value Ref Range   Weight 2,705.49 oz   Height 63 in   BP 105/72 mmHg  Procalcitonin     Status: None   Collection Time: 06/21/19  5:25 AM  Result Value Ref Range   Procalcitonin 23.02 ng/mL    Comment:        Interpretation: PCT >= 10 ng/mL: Important systemic inflammatory response, almost exclusively due to severe bacterial sepsis or septic shock. (NOTE)       Sepsis PCT Algorithm           Lower Respiratory Tract                                      Infection PCT Algorithm    ----------------------------     ----------------------------         PCT < 0.25 ng/mL                PCT < 0.10 ng/mL         Strongly encourage             Strongly discourage   discontinuation of antibiotics    initiation of antibiotics    ----------------------------     -----------------------------       PCT 0.25 - 0.50 ng/mL            PCT 0.10 - 0.25 ng/mL               OR       >80% decrease in PCT            Discourage initiation of                                            antibiotics      Encourage discontinuation           of antibiotics    ----------------------------     -----------------------------         PCT >= 0.50 ng/mL              PCT 0.26 - 0.50 ng/mL                 AND       <80% decrease in PCT             Encourage initiation of  antibiotics       Encourage continuation           of antibiotics    ----------------------------     -----------------------------        PCT >= 0.50 ng/mL                  PCT > 0.50 ng/mL               AND         increase in PCT                  Strongly encourage                                      initiation of antibiotics    Strongly encourage escalation           of antibiotics                                     -----------------------------                                           PCT <= 0.25 ng/mL                                                 OR                                        > 80% decrease in PCT                                     Discontinue / Do not initiate                                             antibiotics Performed at [facility], [address] [place], [place] [address]   CBC     Status: Abnormal   Collection Time: 06/21/19  5:25 AM  Result Value Ref Range   WBC 8.0 4.0 - 10.5 K/uL   RBC 2.75 (L) 3.87 - 5.11 MIL/uL   Hemoglobin 7.7 (L) 12.0 - 15.0 g/dL   HCT 24.4 (L) 36.0 - 46.0 %   MCV 88.7 80.0 - 100.0 fL   MCH 28.0 26.0 - 34.0 pg   MCHC 31.6 30.0 - 36.0 g/dL   RDW 14.0 11.5 - 15.5 %   Platelets 147 (L) 150 - 400 K/uL   nRBC 0.0 0.0 - 0.2 %    Comment: Performed at [facility], [address] [place], [place] [address]  Magnesium     Status: Abnormal   Collection Time: 06/21/19  5:25 AM  Result Value Ref Range   Magnesium 1.4 (L) 1.7 - 2.4 mg/dL    Comment: Performed at [facility],  [place], [place] [address]  Phosphorus     Status: None   Collection Time: 06/21/19  5:25 AM  Result Value Ref Range   Phosphorus 4.1 2.5 - 4.6 mg/dL    Comment: Performed at [facility], [address] [place], [place] [address]  CBC with Differential/Platelet     Status:  Abnormal   Collection Time: 06/21/19  9:02 PM  Result Value Ref Range   WBC 7.4 4.0 - 10.5 K/uL   RBC 2.75 (L) 3.87 - 5.11 MIL/uL   Hemoglobin 7.9 (L) 12.0 - 15.0 g/dL   HCT 23.9 (L) 36.0 - 46.0 %   MCV 86.9 80.0 - 100.0 fL   MCH 28.7 26.0 - 34.0 pg   MCHC 33.1 30.0 - 36.0 g/dL   RDW 13.9 11.5 - 15.5 %   Platelets 164 150 - 400 K/uL   nRBC 0.0 0.0 - 0.2 %   Neutrophils Relative % 76 %   Neutro Abs 5.7 1.7 - 7.7 K/uL   Lymphocytes Relative 15 %   Lymphs Abs 1.1 0.7 - 4.0 K/uL   Monocytes Relative 6 %   Monocytes Absolute 0.4 0.1 - 1.0 K/uL   Eosinophils Relative 1 %   Eosinophils Absolute 0.1 0.0 - 0.5 K/uL   Basophils Relative 0 %   Basophils Absolute 0.0 0.0 - 0.1 K/uL   Immature Granulocytes 2 %   Abs Immature Granulocytes 0.12 (H) 0.00 - 0.07 K/uL    Comment: Performed at [facility], [address] [place], [place] [address]  Comprehensive metabolic panel     Status: Abnormal   Collection Time: 06/22/19  1:18 AM  Result Value Ref Range   Sodium 136 135 - 145 mmol/L   Potassium 3.5 3.5 - 5.1 mmol/L   Chloride 98 98 - 111 mmol/L   CO2 25 22 - 32 mmol/L   Glucose, Bld 89 70 - 99 mg/dL   BUN 10 6 - 20 mg/dL   Creatinine, Ser 0.55 0.44 - 1.00 mg/dL   Calcium 8.0 (L) 8.9 - 10.3 mg/dL   Total Protein 5.2 (L) 6.5 - 8.1 g/dL   Albumin 2.0 (L) 3.5 - 5.0 g/dL   AST 29 15 - 41 U/L   ALT 24 0 - 44 U/L   Alkaline Phosphatase 140 (H) 38 - 126 U/L   Total Bilirubin 0.2 (L) 0.3 - 1.2 mg/dL   GFR calc non Af Amer >60 >60 mL/min   GFR calc Af Amer >60 >60 mL/min   Anion gap 13 5 - 15    Comment: Performed at [facility], [address] [place], [place] [address]  CBC     Status: Abnormal   Collection Time: 06/23/19 10:44 AM  Result Value Ref Range   WBC 6.3 4.0 - 10.5 K/uL   RBC 3.52 (L) 3.87 - 5.11 MIL/uL   Hemoglobin 10.0 (L) 12.0 - 15.0 g/dL    Comment: REPEATED TO VERIFY   HCT 30.6 (L) 36.0 - 46.0 %   MCV 86.9 80.0 - 100.0 fL   MCH 28.4 26.0 - 34.0  pg   MCHC 32.7 30.0 - 36.0 g/dL   RDW 13.5 11.5 - 15.5 %   Platelets 221 150 - 400 K/uL   nRBC 0.0 0.0 - 0.2 %    Comment: Performed at [facility], [address] [place],  [id number]    -------------------------------------------------------------------------- A&P:  Problem List Items Addressed This Visit    None  Visit Diagnoses    Allergic contact dermatitis, unspecified trigger    -  Primary   Relevant Medications   triamcinolone cream (KENALOG) 0.5 %    Clinically history suggestive of urticarial rash vs dermatitis Not consistent with bed bug or other arthropod bites based on her history Trial triamcinolone 0.5% BID 1-2 week then PRN Follow up if not resolved  Emphasized she will need to contact her GYN to follow-up after hospitalization and proceed with Urology follow-up as scheduled, if any significant acute worsening or new concern for infection or other issue needs to seek care back at hospital   Meds ordered this encounter  Medications  . triamcinolone cream (KENALOG) 0.5 %    Sig: Apply 1 application topically 2 (two) times daily. To affected areas, for up to 2 weeks.    Dispense:  30 g    Refill:  1    Follow-up: - Return as needed if not improved  Patient verbalizes understanding with the above medical recommendations including the limitation of remote medical advice.  Specific follow-up and call-back criteria were given for patient to follow-up or seek medical care more urgently if needed.   - Time spent in direct consultation with patient on phone: 8 minutes  Nobie Putnam, [facility] 07/07/2019, 11:59 AM

## 2019-07-29 DIAGNOSIS — L282 Other prurigo: Secondary | ICD-10-CM

## 2019-07-29 DIAGNOSIS — B354 Tinea corporis: Secondary | ICD-10-CM

## 2019-07-29 MED ORDER — CLOTRIMAZOLE-BETAMETHASONE 1-0.05 % EX CREA: TOPICAL_CREAM | Freq: Two times a day (BID) | CUTANEOUS | 0 refills | Status: DC

## 2019-08-04 ENCOUNTER — Encounter

## 2019-08-04 ENCOUNTER — Encounter: Payer: Self-pay | Admitting: Physician Assistant

## 2019-08-04 ENCOUNTER — Ambulatory Visit: Payer: 59 | Admitting: Urology

## 2019-08-04 ENCOUNTER — Ambulatory Visit: Payer: 59 | Admitting: Physician Assistant

## 2019-08-04 LAB — HM PAP SMEAR: HM Pap smear: NEGATIVE

## 2019-08-10 ENCOUNTER — Ambulatory Visit (INDEPENDENT_AMBULATORY_CARE_PROVIDER_SITE_OTHER): Payer: 59 | Admitting: Family Medicine

## 2019-08-10 ENCOUNTER — Other Ambulatory Visit: Payer: Self-pay

## 2019-08-10 ENCOUNTER — Encounter: Payer: Self-pay | Admitting: Family Medicine

## 2019-08-10 DIAGNOSIS — G4700 Insomnia, unspecified: Secondary | ICD-10-CM | POA: Insufficient documentation

## 2019-08-10 DIAGNOSIS — F5104 Psychophysiologic insomnia: Secondary | ICD-10-CM

## 2019-08-10 DIAGNOSIS — F331 Major depressive disorder, recurrent, moderate: Secondary | ICD-10-CM

## 2019-08-10 DIAGNOSIS — F3341 Major depressive disorder, recurrent, in partial remission: Secondary | ICD-10-CM | POA: Insufficient documentation

## 2019-08-10 DIAGNOSIS — F3340 Major depressive disorder, recurrent, in remission, unspecified: Secondary | ICD-10-CM | POA: Insufficient documentation

## 2019-08-10 MED ORDER — SERTRALINE HCL 50 MG PO TABS: 50.0000 mg | ORAL_TABLET | Freq: Every day | ORAL | 2 refills | Status: DC

## 2019-08-10 NOTE — Patient Instructions (Signed)
Start Sertraline (Zoloft) 50mg  daily for depression. If improved but not strong enough after 2-3 weeks can increase to ONE AND HALF pill = 75mg  dose - we may increase up to 100mg  or 2 pills in future. - Schedule visit with me by phone again.  If doing well on this dose we can follow-up in 3 months  Start Melatonin at night if need 1mg  up to 5mg  then up to 10mg  max, gradual increase.  Please schedule a Follow-up Appointment to: No follow-ups on file.  If you have any other questions or concerns, please feel free to call the office or send a message through [place]. You may also schedule an earlier appointment if necessary.  Additionally, you may be receiving a survey about your experience at our office within a few days to 1 week by e-mail or mail. We value your feedback.  Nobie Putnam, DO [place]

## 2019-08-10 NOTE — Progress Notes (Addendum)
Virtual Visit via Telephone The purpose of this virtual visit is to provide medical care while limiting exposure to the novel coronavirus (COVID19) for both patient and office staff.  Consent was obtained for phone visit:  Yes.   Answered questions that patient had about telehealth interaction:  Yes.   I discussed the limitations, risks, security and privacy concerns of performing an evaluation and management service by telephone. I also discussed with the patient that there may be a patient responsible charge related to this service. The patient expressed understanding and agreed to proceed.  Patient Location: Home Provider Location: [facility])  ---------------------------------------------------------------------- Chief Complaint  Patient presents with  . Depression    S: Reviewed CMA documentation. I have called patient and gathered additional HPI as follows:  Major Depression, recurrent, moderate - History of has seen Psychiatrist around age 36, she was tried on Celexa and did not do as well, tried Fluoxetine for up to 2 years, and did well then came off of it eventually when felt like it could do well on her own. In past in 2018 she has been treated by me at our office for Generalized Anxiety and was on Escitalopram did well with that for period of time but it did make her sleepy and groggy, she has remained off if this. - She saw her GYN recently and was referred back to [place] for anti depressant management. - Now symptoms feel more depressed, feels "flat or blank" and decreased interested, has some labile of emotions and occasional crying, some irritability as well. She denies any known trigger for mood. -  She has never been on Zoloft sertraline before. - She just recently got Nexplanon in 2 weeks ago, no other side effects from it and otherwise doing well, asking if this could affect her mood from hormones - Admits insomnia, reduced sleep, avg about 3 hours  nightly PRN. Asking about melatonin - Denies suicidal ideation - Denies significant anxiety  Denies any high risk travel to areas of current concern for COVID19. Denies any known or suspected exposure to person with or possibly with COVID19.  Denies any fevers, chills, sweats, body ache, cough, shortness of breath, sinus pain or pressure, headache, abdominal pain, diarrhea  Past Medical History:  Diagnosis Date  . Anemia   . Anxiety   . Kidney stone    Social History   Tobacco Use  . Smoking status: Never Smoker  . Smokeless tobacco: Never Used  Substance Use Topics  . Alcohol use: No  . Drug use: No    Current Outpatient Medications:  .  acetaminophen (TYLENOL) 500 MG tablet, Take 2 tablets (1,000 mg total) by mouth every 6 (six) hours as needed for mild pain, moderate pain or headache., Disp: 60 tablet, Rfl: 0 .  clotrimazole-betamethasone (LOTRISONE) cream, Apply topically 2 (two) times daily. Apply to affected areas of skin for up to 1-2 weeks., Disp: 30 g, Rfl: 0 .  ferrous sulfate 325 (65 FE) MG EC tablet, Take 325 mg by mouth daily., Disp: , Rfl:  .  ibuprofen (ADVIL) 600 MG tablet, Take 1 tablet (600 mg total) by mouth every 6 (six) hours as needed for headache, mild pain, moderate pain or cramping., Disp: 60 tablet, Rfl: 0 .  norethindrone (ORTHO MICRONOR) 0.35 MG tablet, Take 1 tablet (0.35 mg total) by mouth daily., Disp: 1 Package, Rfl: 11 .  Prenatal Vit-Fe Fumarate-FA (MULTIVITAMIN-PRENATAL) 27-0.8 MG TABS tablet, Take 1 tablet by mouth daily at 12 noon.,  Disp: , Rfl:  .  triamcinolone cream (KENALOG) 0.5 %, Apply 1 application topically 2 (two) times daily. To affected areas, for up to 2 weeks., Disp: 30 g, Rfl: 1 .  sertraline (ZOLOFT) 50 MG tablet, Take 1 tablet (50 mg total) by mouth daily., Disp: 30 tablet, Rfl: 2  Depression screen [facility] 2/9 08/10/2019 10/22/2018 06/02/2018  Decreased Interest 2 0 0  Down, Depressed, Hopeless 3 0 0  PHQ - 2 Score 5 0 0  Altered  sleeping 3 - -  Tired, decreased energy 2 - -  Change in appetite 1 - -  Feeling bad or failure about yourself  0 - -  Trouble concentrating 0 - -  Moving slowly or fidgety/restless 0 - -  Suicidal thoughts 0 - -  PHQ-9 Score 11 - -  Difficult doing work/chores Not difficult at all - -    GAD 7 : Generalized Anxiety Score 08/10/2019 04/05/2017 03/04/2017 03/04/2017  Nervous, Anxious, on Edge 0 1 2 2   Control/stop worrying 1 1 3 3   Worry too much - different things 1 1 3 3   Trouble relaxing 0 0 0 0  Restless 0 0 0 0  Easily annoyed or irritable 2 0 0 0  Afraid - awful might happen 1 0 0 0  Total GAD 7 Score 5 3 8 8   Anxiety Difficulty Not difficult at all Not difficult at all - Somewhat difficult    -------------------------------------------------------------------------- O: No physical exam performed due to remote telephone encounter.  Lab results reviewed.  Recent Results (from the past 2160 hour(s))  Chlamydia/GC NAA, Confirmation     Status: None   Collection Time: 06/01/19 12:00 AM  Result Value Ref Range   Chlamydia trachomatis, NAA Negative     Comment: [facility]  Chlamydia screen     Status: None   Collection Time: 06/01/19 12:00 AM   Specimen: Genital  Result Value Ref Range   Chlamydia Probe Amp Negative     Comment: [facility]  Comprehensive metabolic panel     Status: Abnormal   Collection Time: 06/15/19  3:15 PM  Result Value Ref Range   Sodium 136 135 - 145 mmol/L   Potassium 3.9 3.5 - 5.1 mmol/L   Chloride 104 98 - 111 mmol/L   CO2 23 22 - 32 mmol/L   Glucose, Bld 85 70 - 99 mg/dL   BUN <5 (L) 6 - 20 mg/dL   Creatinine, Ser 0.46 0.44 - 1.00 mg/dL   Calcium 8.9 8.9 - 10.3 mg/dL   Total Protein 6.5 6.5 - 8.1 g/dL   Albumin 2.8 (L) 3.5 - 5.0 g/dL   AST 11 (L) 15 - 41 U/L   ALT 9 0 - 44 U/L   Alkaline Phosphatase 228 (H) 38 - 126 U/L   Total Bilirubin 0.4 0.3 - 1.2 mg/dL   GFR calc non Af Amer >60 >60 mL/min    GFR calc Af Amer >60 >60 mL/min   Anion gap 9 5 - 15    Comment: Performed at [facility], [address] [place], [place] [address]  CBC     Status: Abnormal   Collection Time: 06/15/19  3:15 PM  Result Value Ref Range   WBC 8.4 4.0 - 10.5 K/uL   RBC 3.32 (L) 3.87 - 5.11 MIL/uL   Hemoglobin 9.6 (L) 12.0 - 15.0 g/dL   HCT 29.2 (L) 36.0 - 46.0 %   MCV 88.0 80.0 - 100.0 fL  MCH 28.9 26.0 - 34.0 pg   MCHC 32.9 30.0 - 36.0 g/dL   RDW 13.2 11.5 - 15.5 %   Platelets 200 150 - 400 K/uL   nRBC 0.0 0.0 - 0.2 %    Comment: Performed at [facility], [address] [place], [place] [address]  TSH     Status: None   Collection Time: 06/15/19  3:15 PM  Result Value Ref Range   TSH 1.738 0.350 - 4.500 uIU/mL    Comment: Performed by a 3rd Generation assay with a functional sensitivity of <=0.01 uIU/mL. Performed at [facility], [address] [place], [place] [address]   T4, free     Status: None   Collection Time: 06/15/19  3:15 PM  Result Value Ref Range   Free T4 0.77 0.61 - 1.12 ng/dL    Comment: (NOTE) Biotin ingestion may interfere with free T4 tests. If the results are inconsistent with the TSH level, previous test results, or the clinical presentation, then consider biotin interference. If needed, order repeat testing after stopping biotin. Performed at [facility], [address] [place], [place] [address]   Sample to Blood Bank     Status: None   Collection Time: 06/15/19  3:15 PM  Result Value Ref Range   Blood Bank Specimen SAMPLE AVAILABLE FOR TESTING    Sample Expiration      06/18/2019,2359 Performed at [facility], [address] [place], [place] [address]   Type and screen [place]     Status: None   Collection Time: 06/15/19  3:15 PM  Result Value Ref Range   ABO/RH(D) O POS    Antibody Screen NEG    Sample Expiration      06/18/2019,2359 Performed at [facility], [address] [place], [place] [address]   RPR     Status: None   Collection Time: 06/15/19  3:15 PM  Result Value Ref Range   RPR Ser Ql Non Reactive Non Reactive    Comment: (NOTE) Performed At: [facility] [address] [place], [place] [id number] Rush Farmer MD [id number]   Protein / creatinine ratio, urine     Status: Abnormal   Collection Time: 06/15/19  3:37 PM  Result Value Ref Range   Creatinine, Urine 113 mg/dL   Total Protein, Urine 450 mg/dL    Comment: RESULT CONFIRMED BY MANUAL DILUTION/TFK NO NORMAL RANGE ESTABLISHED FOR THIS TEST    Protein Creatinine Ratio 3.98 (H) 0.00 - 0.15 mg/mg[Cre]    Comment: Performed at [facility], [address] [place], [place] [address]  SARS Coronavirus 2 [facility] order, Performed in [facility] hospital lab) Nasopharyngeal Nasopharyngeal Swab     Status: None   Collection Time: 06/15/19  6:46 PM   Specimen: Nasopharyngeal Swab  Result Value Ref Range   SARS Coronavirus 2 NEGATIVE NEGATIVE    Comment: (NOTE) If result is NEGATIVE SARS-CoV-2 target nucleic acids are NOT DETECTED. The SARS-CoV-2 RNA is generally detectable in upper and lower  respiratory specimens during the acute phase of infection. The lowest  concentration of SARS-CoV-2 viral copies this assay can detect is 250  copies / mL. A negative result does not preclude SARS-CoV-2 infection  and should not be used as the sole basis for treatment or other  patient management decisions.  A negative result may occur with  improper specimen collection / handling, submission of specimen other  than nasopharyngeal swab, presence of viral  mutation(s) within the  areas targeted by this assay, and inadequate number of viral copies  (<250 copies / mL). A negative result must be combined with clinical  observations, patient history, and epidemiological information. If result is POSITIVE SARS-CoV-2 target nucleic acids are DETECTED. The SARS-CoV-2 RNA is  generally detectable in upper and lower  respiratory specimens dur ing the acute phase of infection.  Positive  results are indicative of active infection with SARS-CoV-2.  Clinical  correlation with patient history and other diagnostic information is  necessary to determine patient infection status.  Positive results do  not rule out bacterial infection or co-infection with other viruses. If result is PRESUMPTIVE POSTIVE SARS-CoV-2 nucleic acids MAY BE PRESENT.   A presumptive positive result was obtained on the submitted specimen  and confirmed on repeat testing.  While 2019 novel coronavirus  (SARS-CoV-2) nucleic acids may be present in the submitted sample  additional confirmatory testing may be necessary for epidemiological  and / or clinical management purposes  to differentiate between  SARS-CoV-2 and other Sarbecovirus currently known to infect humans.  If clinically indicated additional testing with an alternate test  methodology [phone]) is advised. The SARS-CoV-2 RNA is generally  detectable in upper and lower respiratory sp ecimens during the acute  phase of infection. The expected result is Negative. Fact Sheet for Patients:  StrictlyIdeas.no Fact Sheet for Healthcare Providers: BankingDealers.co.za This test is not yet approved or cleared by the [place] FDA and has been authorized for detection and/or diagnosis of SARS-CoV-2 by FDA under an Emergency Use Authorization (EUA).  This EUA will remain in effect (meaning this test can be used) for the duration of the COVID-19 declaration under Section 564(b)(1) of the Act, 21 U.S.C. section 360bbb-3(b)(1), unless the authorization is terminated or revoked sooner. Performed at [facility], [address] [place], [place] [address]   Magnesium     Status: Abnormal   Collection Time: 06/16/19  6:38 AM  Result Value Ref Range   Magnesium 4.0 (H) 1.7 - 2.4 mg/dL     Comment: Performed at [facility], [address] [place], [place] [address]  CBC     Status: Abnormal   Collection Time: 06/17/19  7:23 AM  Result Value Ref Range   WBC 9.9 4.0 - 10.5 K/uL   RBC 3.04 (L) 3.87 - 5.11 MIL/uL   Hemoglobin 8.6 (L) 12.0 - 15.0 g/dL   HCT 26.7 (L) 36.0 - 46.0 %   MCV 87.8 80.0 - 100.0 fL   MCH 28.3 26.0 - 34.0 pg   MCHC 32.2 30.0 - 36.0 g/dL   RDW 13.1 11.5 - 15.5 %   Platelets 163 150 - 400 K/uL   nRBC 0.0 0.0 - 0.2 %    Comment: Performed at [facility], [address] [place], [place] [address]  CBC     Status: Abnormal   Collection Time: 06/18/19  7:17 AM  Result Value Ref Range   WBC 9.8 4.0 - 10.5 K/uL   RBC 2.97 (L) 3.87 - 5.11 MIL/uL   Hemoglobin 8.3 (L) 12.0 - 15.0 g/dL   HCT 26.2 (L) 36.0 - 46.0 %   MCV 88.2 80.0 - 100.0 fL   MCH 27.9 26.0 - 34.0 pg   MCHC 31.7 30.0 - 36.0 g/dL   RDW 13.2 11.5 - 15.5 %   Platelets 182 150 - 400 K/uL   nRBC 0.0 0.0 - 0.2 %    Comment: Performed at [facility], [address] [place], [place] [address]  Comprehensive metabolic panel     Status: Abnormal   Collection Time: 06/18/19  7:17 AM  Result Value Ref Range   Sodium 137 135 - 145 mmol/L   Potassium 4.0 3.5 - 5.1 mmol/L   Chloride 105 98 - 111 mmol/L   CO2 22 22 - 32 mmol/L   Glucose, Bld 99 70 - 99 mg/dL   BUN 6 6 - 20 mg/dL   Creatinine, Ser 0.63 0.44 - 1.00 mg/dL   Calcium 8.2 (L) 8.9 - 10.3 mg/dL   Total Protein 5.4 (L) 6.5 - 8.1 g/dL   Albumin 2.1 (L) 3.5 - 5.0 g/dL   AST 22 15 - 41 U/L   ALT 11 0 - 44 U/L   Alkaline Phosphatase 151 (H) 38 - 126 U/L   Total Bilirubin 0.3 0.3 - 1.2 mg/dL   GFR calc non Af Amer >60 >60 mL/min   GFR calc Af Amer >60 >60 mL/min   Anion gap 10 5 - 15    Comment: Performed at [facility], [address] [place], [place] [address]  Comprehensive metabolic panel     Status: Abnormal   Collection Time: 06/18/19  6:34 PM  Result Value Ref Range   Sodium 132  (L) 135 - 145 mmol/L   Potassium 3.1 (L) 3.5 - 5.1 mmol/L   Chloride 102 98 - 111 mmol/L   CO2 21 (L) 22 - 32 mmol/L   Glucose, Bld 107 (H) 70 - 99 mg/dL   BUN 6 6 - 20 mg/dL   Creatinine, Ser 0.62 0.44 - 1.00 mg/dL   Calcium 8.0 (L) 8.9 - 10.3 mg/dL   Total Protein 5.6 (L) 6.5 - 8.1 g/dL   Albumin 2.4 (L) 3.5 - 5.0 g/dL   AST 24 15 - 41 U/L   ALT 13 0 - 44 U/L   Alkaline Phosphatase 140 (H) 38 - 126 U/L   Total Bilirubin 0.4 0.3 - 1.2 mg/dL   GFR calc non Af Amer >60 >60 mL/min   GFR calc Af Amer >60 >60 mL/min   Anion gap 9 5 - 15    Comment: Performed at [facility], [address] [place], [place] [address]  Lactic acid, plasma     Status: Abnormal   Collection Time: 06/18/19  6:34 PM  Result Value Ref Range   Lactic Acid, Venous 2.1 (HH) 0.5 - 1.9 mmol/L    Comment: CRITICAL RESULT CALLED TO, READ BACK BY AND VERIFIED WITH JESSICA FULCHER 06/18/19 @ 1919  MLK Performed at [facility], [address] [place], [place] [address]   CBC with Differential     Status: Abnormal   Collection Time: 06/18/19  6:34 PM  Result Value Ref Range   WBC 9.0 4.0 - 10.5 K/uL   RBC 2.85 (L) 3.87 - 5.11 MIL/uL   Hemoglobin 8.1 (L) 12.0 - 15.0 g/dL   HCT 25.0 (L) 36.0 - 46.0 %   MCV 87.7 80.0 - 100.0 fL   MCH 28.4 26.0 - 34.0 pg   MCHC 32.4 30.0 - 36.0 g/dL   RDW 13.2 11.5 - 15.5 %   Platelets 151 150 - 400 K/uL   nRBC 0.0 0.0 - 0.2 %   Neutrophils Relative % 88 %   Neutro Abs 8.0 (H) 1.7 - 7.7 K/uL   Lymphocytes Relative 5 %   Lymphs Abs 0.5 (L) 0.7 - 4.0 K/uL   Monocytes Relative 6 %  Monocytes Absolute 0.5 0.1 - 1.0 K/uL   Eosinophils Relative 0 %   Eosinophils Absolute 0.0 0.0 - 0.5 K/uL   Basophils Relative 0 %   Basophils Absolute 0.0 0.0 - 0.1 K/uL   Immature Granulocytes 1 %   Abs Immature Granulocytes 0.09 (H) 0.00 - 0.07 K/uL    Comment: Performed at [facility], [address] [place], [place] [address]  Uric acid     Status: None    Collection Time: 06/18/19  6:34 PM  Result Value Ref Range   Uric Acid, Serum 3.1 2.5 - 7.1 mg/dL    Comment: Performed at [facility], [address] [place], [place] [address]  Urinalysis, Complete w Microscopic     Status: Abnormal   Collection Time: 06/18/19  6:35 PM  Result Value Ref Range   Color, Urine YELLOW (A) YELLOW   APPearance CLOUDY (A) CLEAR   Specific Gravity, Urine 1.017 1.005 - 1.030   pH 6.0 5.0 - 8.0   Glucose, UA NEGATIVE NEGATIVE mg/dL   Hgb urine dipstick LARGE (A) NEGATIVE   Bilirubin Urine NEGATIVE NEGATIVE   Ketones, ur 5 (A) NEGATIVE mg/dL   Protein, ur 100 (A) NEGATIVE mg/dL   Nitrite NEGATIVE NEGATIVE   Leukocytes,Ua MODERATE (A) NEGATIVE   RBC / HPF >50 (H) 0 - 5 RBC/hpf   WBC, UA >50 (H) 0 - 5 WBC/hpf   Bacteria, UA RARE (A) NONE SEEN   Squamous Epithelial / LPF 0-5 0 - 5   Mucus PRESENT     Comment: Performed at [facility], [address] [place], [place] [address]  Culture, blood (routine x 2)     Status: None   Collection Time: 06/18/19  6:35 PM   Specimen: BLOOD  Result Value Ref Range   Specimen Description BLOOD RIGHT ANTECUBITAL    Special Requests      BOTTLES DRAWN AEROBIC AND ANAEROBIC Blood Culture adequate volume   Culture      NO GROWTH 5 DAYS Performed at [facility], [address] [place], [place] [address]    Report Status 06/23/2019 FINAL   Culture, blood (routine x 2)     Status: None   Collection Time: 06/18/19  6:35 PM   Specimen: BLOOD  Result Value Ref Range   Specimen Description BLOOD BLOOD LEFT WRIST    Special Requests      BOTTLES DRAWN AEROBIC AND ANAEROBIC Blood Culture adequate volume   Culture      NO GROWTH 5 DAYS Performed at [facility], [address] [place], [place] [address]    Report Status 06/23/2019 FINAL   Protein / creatinine ratio, urine     Status: Abnormal   Collection Time: 06/18/19  6:35 PM  Result Value Ref Range   Creatinine, Urine 178  mg/dL   Total Protein, Urine 88 mg/dL    Comment: NO NORMAL RANGE ESTABLISHED FOR THIS TEST   Protein Creatinine Ratio 0.49 (H) 0.00 - 0.15 mg/mg[Cre]    Comment: Performed at [facility], [address] [place], [place] [address]  Urine Culture     Status: Abnormal   Collection Time: 06/18/19  6:35 PM   Specimen: Urine, Random  Result Value Ref Range   Specimen Description      URINE, RANDOM Performed at [facility], [address], [place], [place] [address]    Special Requests      NONE Performed at [facility], [address] [place], [place] [address]    Culture (A)     <10,000 COLONIES/mL INSIGNIFICANT GROWTH Performed at [facility] [address], [place], [place] [address]    Report Status 06/20/2019 FINAL   SARS Coronavirus 2 [facility] order, Performed in [facility] hospital lab) Nasopharyngeal Nasopharyngeal Swab     Status: None   Collection Time: 06/18/19  7:18 PM   Specimen: Nasopharyngeal Swab  Result Value Ref Range   SARS Coronavirus 2 NEGATIVE NEGATIVE    Comment: (NOTE) If result is NEGATIVE SARS-CoV-2 target nucleic acids are NOT DETECTED. The SARS-CoV-2 RNA is generally detectable in upper and lower  respiratory specimens during the acute phase of infection. The lowest  concentration of SARS-CoV-2 viral copies this assay can detect is 250  copies / mL. A negative result does not preclude SARS-CoV-2 infection  and should not be used as the sole basis for treatment or other  patient management decisions.  A negative result may occur with  improper specimen collection / handling, submission of specimen other  than nasopharyngeal swab, presence of viral mutation(s) within the  areas targeted by this assay, and inadequate number of viral copies  (<250 copies / mL). A negative result must be combined with clinical  observations, patient history, and epidemiological information. If result is POSITIVE SARS-CoV-2 target  nucleic acids are DETECTED. The SARS-CoV-2 RNA is generally detectable in upper and lower  respiratory specimens dur ing the acute phase of infection.  Positive  results are indicative of active infection with SARS-CoV-2.  Clinical  correlation with patient history and other diagnostic information is  necessary to determine patient infection status.  Positive results do  not rule out bacterial infection or co-infection with other viruses. If result is PRESUMPTIVE POSTIVE SARS-CoV-2 nucleic acids MAY BE PRESENT.   A presumptive positive result was obtained on the submitted specimen  and confirmed on repeat testing.  While 2019 novel coronavirus  (SARS-CoV-2) nucleic acids may be present in the submitted sample  additional confirmatory testing may be necessary for epidemiological  and / or clinical management purposes  to differentiate between  SARS-CoV-2 and other Sarbecovirus currently known to infect humans.  If clinically indicated additional testing with an alternate test  methodology [phone]) is advised. The SARS-CoV-2 RNA is generally  detectable in upper and lower respiratory sp ecimens during the acute  phase of infection. The expected result is Negative. Fact Sheet for Patients:  StrictlyIdeas.no Fact Sheet for Healthcare Providers: BankingDealers.co.za This test is not yet approved or cleared by the [place] FDA and has been authorized for detection and/or diagnosis of SARS-CoV-2 by FDA under an Emergency Use Authorization (EUA).  This EUA will remain in effect (meaning this test can be used) for the duration of the COVID-19 declaration under Section 564(b)(1) of the Act, 21 U.S.C. section 360bbb-3(b)(1), unless the authorization is terminated or revoked sooner. Performed at [facility], [address] [place], [place] [address]   Lactic acid, plasma     Status: None   Collection Time: 06/18/19  8:58 PM   Result Value Ref Range   Lactic Acid, Venous 0.8 0.5 - 1.9 mmol/L    Comment: Performed at [facility], [address] [place],  [address]  Type and screen [place]     Status: None   Collection Time: 06/18/19  9:59 PM  Result Value Ref Range   ABO/RH(D) O POS    Antibody Screen NEG  Sample Expiration 06/21/2019,2359    Unit Number [id number]    Blood Component Type RED CELLS,LR    Unit division 00    Status of Unit ISSUED,FINAL    Transfusion Status OK TO TRANSFUSE    Crossmatch Result      Compatible Performed at [facility], [address] [place], [place] [address]   BPAM RBC     Status: None   Collection Time: 06/18/19  9:59 PM  Result Value Ref Range   ISSUE DATE / TIME [id number]    Blood Product Unit Number [id number]    PRODUCT CODE [id number]    Unit Type and Rh 5100    Blood Product Expiration Date [id number]   CBC on admission     Status: Abnormal   Collection Time: 06/19/19  4:45 AM  Result Value Ref Range   WBC 7.3 4.0 - 10.5 K/uL   RBC 2.53 (L) 3.87 - 5.11 MIL/uL   Hemoglobin 7.1 (L) 12.0 - 15.0 g/dL   HCT 22.3 (L) 36.0 - 46.0 %   MCV 88.1 80.0 - 100.0 fL   MCH 28.1 26.0 - 34.0 pg   MCHC 31.8 30.0 - 36.0 g/dL   RDW 13.3 11.5 - 15.5 %   Platelets 142 (L) 150 - 400 K/uL   nRBC 0.0 0.0 - 0.2 %    Comment: Performed at [facility], [address] [place], [place] [address]  Comprehensive metabolic panel     Status: Abnormal   Collection Time: 06/19/19  4:45 AM  Result Value Ref Range   Sodium 135 135 - 145 mmol/L   Potassium 3.7 3.5 - 5.1 mmol/L   Chloride 109 98 - 111 mmol/L   CO2 20 (L) 22 - 32 mmol/L   Glucose, Bld 115 (H) 70 - 99 mg/dL   BUN 7 6 - 20 mg/dL   Creatinine, Ser 0.49 0.44 - 1.00 mg/dL   Calcium 7.8 (L) 8.9 - 10.3 mg/dL   Total Protein 5.0 (L) 6.5 - 8.1 g/dL   Albumin 2.0 (L) 3.5 - 5.0 g/dL   AST 28 15 - 41 U/L   ALT 15 0 - 44 U/L   Alkaline Phosphatase 122  38 - 126 U/L   Total Bilirubin 0.5 0.3 - 1.2 mg/dL   GFR calc non Af Amer >60 >60 mL/min   GFR calc Af Amer >60 >60 mL/min   Anion gap 6 5 - 15    Comment: Performed at [facility], [address] [place], [place] [address]  hCG, quantitative, pregnancy     Status: Abnormal   Collection Time: 06/19/19  4:45 AM  Result Value Ref Range   hCG, Beta Chain, Quant, S 1,263 (H) <5 mIU/mL    Comment:          GEST. AGE      CONC.  (mIU/mL)   <=1 WEEK        5 - 50     2 WEEKS       50 - 500     3 WEEKS       100 - 10,000     4 WEEKS     1,000 - 30,000     5 WEEKS     3,500 - 115,000   6-8 WEEKS     12,000 - 270,000    12 WEEKS     15,000 - 220,000        FEMALE AND NON-PREGNANT FEMALE:     LESS  THAN 5 mIU/mL Performed at [facility], [address] [place], [place] [address]   Prepare RBC     Status: None   Collection Time: 06/19/19  9:15 AM  Result Value Ref Range   Order Confirmation      ORDER PROCESSED BY BLOOD BANK Performed at [facility], [place], [place] [address]   Troponin I (High Sensitivity)     Status: None   Collection Time: 06/19/19  9:47 AM  Result Value Ref Range   Troponin I (High Sensitivity) 4 <18 ng/L    Comment: (NOTE) Elevated high sensitivity troponin I (hsTnI) values and significant  changes across serial measurements may suggest ACS but many other  chronic and acute conditions are known to elevate hsTnI results.  Refer to the "Links" section for chest pain algorithms and additional  guidance. Performed at [facility], [place], [place] [address]   Glucose, capillary     Status: Abnormal   Collection Time: 06/19/19 12:49 PM  Result Value Ref Range   Glucose-Capillary 58 (L) 70 - 99 mg/dL  Glucose, capillary     Status: None   Collection Time: 06/19/19  1:05 PM  Result Value Ref Range   Glucose-Capillary 76 70 - 99 mg/dL  Glucose, capillary     Status: Abnormal   Collection  Time: 06/19/19  1:44 PM  Result Value Ref Range   Glucose-Capillary 106 (H) 70 - 99 mg/dL  Protime-INR     Status: Abnormal   Collection Time: 06/19/19  3:04 PM  Result Value Ref Range   Prothrombin Time 15.3 (H) 11.4 - 15.2 seconds   INR 1.2 0.8 - 1.2    Comment: (NOTE) INR goal varies based on device and disease states. Performed at [facility], [address] [place], [place] [address]   Fibrinogen     Status: Abnormal   Collection Time: 06/19/19  3:04 PM  Result Value Ref Range   Fibrinogen 630 (H) 210 - 475 mg/dL    Comment: Performed at [facility], [address] [place], [place] [address]  Lactic acid, plasma     Status: None   Collection Time: 06/19/19  3:04 PM  Result Value Ref Range   Lactic Acid, Venous 1.3 0.5 - 1.9 mmol/L    Comment: Performed at [facility], [address] [place], [place] [address]  Comprehensive metabolic panel     Status: Abnormal   Collection Time: 06/19/19  3:04 PM  Result Value Ref Range   Sodium 137 135 - 145 mmol/L   Potassium 3.2 (L) 3.5 - 5.1 mmol/L   Chloride 109 98 - 111 mmol/L   CO2 20 (L) 22 - 32 mmol/L   Glucose, Bld 97 70 - 99 mg/dL   BUN 7 6 - 20 mg/dL   Creatinine, Ser 0.50 0.44 - 1.00 mg/dL   Calcium 7.9 (L) 8.9 - 10.3 mg/dL   Total Protein 5.2 (L) 6.5 - 8.1 g/dL   Albumin 2.2 (L) 3.5 - 5.0 g/dL   AST 48 (H) 15 - 41 U/L   ALT 25 0 - 44 U/L   Alkaline Phosphatase 112 38 - 126 U/L   Total Bilirubin 0.4 0.3 - 1.2 mg/dL   GFR calc non Af Amer >60 >60 mL/min   GFR calc Af Amer >60 >60 mL/min   Anion gap 8 5 - 15    Comment: Performed at [facility], [address] [place],  [place] [address]  CBC     Status: Abnormal   Collection Time: 06/19/19  3:04 PM  Result Value Ref Range   WBC 7.9 4.0 - 10.5 K/uL   RBC 3.02 (L) 3.87 - 5.11 MIL/uL   Hemoglobin 8.6 (L) 12.0 - 15.0 g/dL   HCT 26.1 (L) 36.0 - 46.0 %   MCV 86.4 80.0 - 100.0 fL   MCH 28.5 26.0 - 34.0 pg   MCHC 33.0 30.0 -  36.0 g/dL   RDW 13.7 11.5 - 15.5 %   Platelets 139 (L) 150 - 400 K/uL   nRBC 0.0 0.0 - 0.2 %    Comment: Performed at [facility], [address] [place], [place] [address]  Procalcitonin - Baseline     Status: None   Collection Time: 06/19/19  3:04 PM  Result Value Ref Range   Procalcitonin 4.18 ng/mL    Comment:        Interpretation: PCT > 2 ng/mL: Systemic infection (sepsis) is likely, unless other causes are known. (NOTE)       Sepsis PCT Algorithm           Lower Respiratory Tract                                      Infection PCT Algorithm    ----------------------------     ----------------------------         PCT < 0.25 ng/mL                PCT < 0.10 ng/mL         Strongly encourage             Strongly discourage   discontinuation of antibiotics    initiation of antibiotics    ----------------------------     -----------------------------       PCT 0.25 - 0.50 ng/mL            PCT 0.10 - 0.25 ng/mL               OR       >80% decrease in PCT            Discourage initiation of                                            antibiotics      Encourage discontinuation           of antibiotics    ----------------------------     -----------------------------         PCT >= 0.50 ng/mL              PCT 0.26 - 0.50 ng/mL               AND       <80% decrease in PCT              Encourage initiation of                                             antibiotics       Encourage continuation           of antibiotics    ----------------------------     -----------------------------  PCT >= 0.50 ng/mL                  PCT > 0.50 ng/mL               AND         increase in PCT                  Strongly encourage                                      initiation of antibiotics    Strongly encourage escalation           of antibiotics                                     -----------------------------                                           PCT <= 0.25 ng/mL                                                  OR                                        > 80% decrease in PCT                                     Discontinue / Do not initiate                                             antibiotics Performed at [facility], [address] [place], [place] [address]   CBC     Status: Abnormal   Collection Time: 06/19/19  9:10 PM  Result Value Ref Range   WBC 11.2 (H) 4.0 - 10.5 K/uL   RBC 2.95 (L) 3.87 - 5.11 MIL/uL   Hemoglobin 8.4 (L) 12.0 - 15.0 g/dL   HCT 25.7 (L) 36.0 - 46.0 %   MCV 87.1 80.0 - 100.0 fL   MCH 28.5 26.0 - 34.0 pg   MCHC 32.7 30.0 - 36.0 g/dL   RDW 13.9 11.5 - 15.5 %   Platelets 133 (L) 150 - 400 K/uL   nRBC 0.0 0.0 - 0.2 %    Comment: Performed at [facility], [address] [place], [place] [id number]  Basic metabolic panel     Status: Abnormal   Collection Time: 06/19/19  9:10 PM  Result Value Ref Range   Sodium 136 135 - 145 mmol/L   Potassium 3.3 (L) 3.5 - 5.1 mmol/L   Chloride 108 98 - 111 mmol/L   CO2 21 (L) 22 - 32 mmol/L   Glucose, Bld 114 (H) 70 - 99 mg/dL   BUN 8 6 - 20  mg/dL   Creatinine, Ser 0.51 0.44 - 1.00 mg/dL   Calcium 7.9 (L) 8.9 - 10.3 mg/dL   GFR calc non Af Amer >60 >60 mL/min   GFR calc Af Amer >60 >60 mL/min   Anion gap 7 5 - 15    Comment: Performed at [facility], [address], [place], [place] [address]  Magnesium     Status: Abnormal   Collection Time: 06/19/19  9:10 PM  Result Value Ref Range   Magnesium 1.4 (L) 1.7 - 2.4 mg/dL    Comment: Performed at [facility], [address] [place], [place] [address]  Phosphorus     Status: None   Collection Time: 06/19/19  9:10 PM  Result Value Ref Range   Phosphorus 3.6 2.5 - 4.6 mg/dL    Comment: Performed at [facility], [address] [place], [place] [address]  Procalcitonin     Status: None   Collection Time: 06/20/19  5:09 AM  Result Value Ref Range   Procalcitonin 36.56 ng/mL    Comment:         Interpretation: PCT >= 10 ng/mL: Important systemic inflammatory response, almost exclusively due to severe bacterial sepsis or septic shock. (NOTE)       Sepsis PCT Algorithm           Lower Respiratory Tract                                      Infection PCT Algorithm    ----------------------------     ----------------------------         PCT < 0.25 ng/mL                PCT < 0.10 ng/mL         Strongly encourage             Strongly discourage   discontinuation of antibiotics    initiation of antibiotics    ----------------------------     -----------------------------       PCT 0.25 - 0.50 ng/mL            PCT 0.10 - 0.25 ng/mL               OR       >80% decrease in PCT            Discourage initiation of                                            antibiotics      Encourage discontinuation           of antibiotics    ----------------------------     -----------------------------         PCT >= 0.50 ng/mL              PCT 0.26 - 0.50 ng/mL                AND       <80% decrease in PCT             Encourage initiation of  antibiotics       Encourage continuation           of antibiotics    ----------------------------     -----------------------------        PCT >= 0.50 ng/mL                  PCT > 0.50 ng/mL               AND         increase in PCT                  Strongly encourage                                      initiation of antibiotics    Strongly encourage escalation           of antibiotics                                     -----------------------------                                           PCT <= 0.25 ng/mL                                                 OR                                        > 80% decrease in PCT                                     Discontinue / Do not initiate                                             antibiotics Performed at [facility], [address]  [place], [place] [address]   CBC     Status: Abnormal   Collection Time: 06/20/19  5:09 AM  Result Value Ref Range   WBC 8.1 4.0 - 10.5 K/uL   RBC 2.79 (L) 3.87 - 5.11 MIL/uL   Hemoglobin 7.9 (L) 12.0 - 15.0 g/dL   HCT 24.6 (L) 36.0 - 46.0 %   MCV 88.2 80.0 - 100.0 fL   MCH 28.3 26.0 - 34.0 pg   MCHC 32.1 30.0 - 36.0 g/dL   RDW 14.0 11.5 - 15.5 %   Platelets 133 (L) 150 - 400 K/uL   nRBC 0.0 0.0 - 0.2 %    Comment: Performed at [facility], [address] [place], [place] [address]  Comprehensive metabolic panel     Status: Abnormal   Collection Time: 06/20/19  5:09 AM  Result Value Ref Range   Sodium 136 135 - 145 mmol/L   Potassium 4.0 3.5 - 5.1  mmol/L   Chloride 108 98 - 111 mmol/L   CO2 21 (L) 22 - 32 mmol/L   Glucose, Bld 91 70 - 99 mg/dL   BUN 10 6 - 20 mg/dL   Creatinine, Ser 0.48 0.44 - 1.00 mg/dL   Calcium 8.2 (L) 8.9 - 10.3 mg/dL   Total Protein 5.0 (L) 6.5 - 8.1 g/dL   Albumin 2.0 (L) 3.5 - 5.0 g/dL   AST 37 15 - 41 U/L   ALT 25 0 - 44 U/L   Alkaline Phosphatase 104 38 - 126 U/L   Total Bilirubin 0.4 0.3 - 1.2 mg/dL   GFR calc non Af Amer >60 >60 mL/min   GFR calc Af Amer >60 >60 mL/min   Anion gap 7 5 - 15    Comment: Performed at [facility], [address] [place], [place] [address]  ECHOCARDIOGRAM COMPLETE     Status: None   Collection Time: 06/20/19  2:50 PM  Result Value Ref Range   Weight 2,705.49 oz   Height 63 in   BP 105/72 mmHg  Procalcitonin     Status: None   Collection Time: 06/21/19  5:25 AM  Result Value Ref Range   Procalcitonin 23.02 ng/mL    Comment:        Interpretation: PCT >= 10 ng/mL: Important systemic inflammatory response, almost exclusively due to severe bacterial sepsis or septic shock. (NOTE)       Sepsis PCT Algorithm           Lower Respiratory Tract                                      Infection PCT Algorithm    ----------------------------     ----------------------------         PCT < 0.25 ng/mL                 PCT < 0.10 ng/mL         Strongly encourage             Strongly discourage   discontinuation of antibiotics    initiation of antibiotics    ----------------------------     -----------------------------       PCT 0.25 - 0.50 ng/mL            PCT 0.10 - 0.25 ng/mL               OR       >80% decrease in PCT            Discourage initiation of                                            antibiotics      Encourage discontinuation           of antibiotics    ----------------------------     -----------------------------         PCT >= 0.50 ng/mL              PCT 0.26 - 0.50 ng/mL                AND       <80% decrease in PCT             Encourage initiation of  antibiotics       Encourage continuation           of antibiotics    ----------------------------     -----------------------------        PCT >= 0.50 ng/mL                  PCT > 0.50 ng/mL               AND         increase in PCT                  Strongly encourage                                      initiation of antibiotics    Strongly encourage escalation           of antibiotics                                     -----------------------------                                           PCT <= 0.25 ng/mL                                                 OR                                        > 80% decrease in PCT                                     Discontinue / Do not initiate                                             antibiotics Performed at [facility], [address] [place], [place] [address]   CBC     Status: Abnormal   Collection Time: 06/21/19  5:25 AM  Result Value Ref Range   WBC 8.0 4.0 - 10.5 K/uL   RBC 2.75 (L) 3.87 - 5.11 MIL/uL   Hemoglobin 7.7 (L) 12.0 - 15.0 g/dL   HCT 24.4 (L) 36.0 - 46.0 %   MCV 88.7 80.0 - 100.0 fL   MCH 28.0 26.0 - 34.0 pg   MCHC 31.6 30.0 - 36.0 g/dL   RDW 14.0 11.5 - 15.5 %   Platelets 147 (L) 150 - 400  K/uL   nRBC 0.0 0.0 - 0.2 %    Comment: Performed at [facility], [address] [place], [place] [address]  Magnesium     Status: Abnormal   Collection Time: 06/21/19  5:25 AM  Result Value Ref Range   Magnesium 1.4 (L) 1.7 - 2.4 mg/dL    Comment: Performed at [facility], [place], [place] [address]  Phosphorus     Status: None   Collection Time: 06/21/19  5:25 AM  Result Value Ref Range   Phosphorus 4.1 2.5 - 4.6 mg/dL    Comment: Performed at [facility], [address] [place], [place] [address]  CBC with Differential/Platelet     Status: Abnormal   Collection Time: 06/21/19  9:02 PM  Result Value Ref Range   WBC 7.4 4.0 - 10.5 K/uL   RBC 2.75 (L) 3.87 - 5.11 MIL/uL   Hemoglobin 7.9 (L) 12.0 - 15.0 g/dL   HCT 23.9 (L) 36.0 - 46.0 %   MCV 86.9 80.0 - 100.0 fL   MCH 28.7 26.0 - 34.0 pg   MCHC 33.1 30.0 - 36.0 g/dL   RDW 13.9 11.5 - 15.5 %   Platelets 164 150 - 400 K/uL   nRBC 0.0 0.0 - 0.2 %   Neutrophils Relative % 76 %   Neutro Abs 5.7 1.7 - 7.7 K/uL   Lymphocytes Relative 15 %   Lymphs Abs 1.1 0.7 - 4.0 K/uL   Monocytes Relative 6 %   Monocytes Absolute 0.4 0.1 - 1.0 K/uL   Eosinophils Relative 1 %   Eosinophils Absolute 0.1 0.0 - 0.5 K/uL   Basophils Relative 0 %   Basophils Absolute 0.0 0.0 - 0.1 K/uL   Immature Granulocytes 2 %   Abs Immature Granulocytes 0.12 (H) 0.00 - 0.07 K/uL    Comment: Performed at [facility], [address] [place], [place] [address]  Comprehensive metabolic panel     Status: Abnormal   Collection Time: 06/22/19  1:18 AM  Result Value Ref Range   Sodium 136 135 - 145 mmol/L   Potassium 3.5 3.5 - 5.1 mmol/L   Chloride 98 98 - 111 mmol/L   CO2 25 22 - 32 mmol/L   Glucose, Bld 89 70 - 99 mg/dL   BUN 10 6 - 20 mg/dL   Creatinine, Ser 0.55 0.44 - 1.00 mg/dL   Calcium 8.0 (L) 8.9 - 10.3 mg/dL   Total Protein 5.2 (L) 6.5 - 8.1 g/dL   Albumin 2.0 (L) 3.5 - 5.0 g/dL   AST 29 15  - 41 U/L   ALT 24 0 - 44 U/L   Alkaline Phosphatase 140 (H) 38 - 126 U/L   Total Bilirubin 0.2 (L) 0.3 - 1.2 mg/dL   GFR calc non Af Amer >60 >60 mL/min   GFR calc Af Amer >60 >60 mL/min   Anion gap 13 5 - 15    Comment: Performed at [facility], [address] [place], [place] [address]  CBC     Status: Abnormal   Collection Time: 06/23/19 10:44 AM  Result Value Ref Range   WBC 6.3 4.0 - 10.5 K/uL   RBC 3.52 (L) 3.87 - 5.11 MIL/uL   Hemoglobin 10.0 (L) 12.0 - 15.0 g/dL    Comment: REPEATED TO VERIFY   HCT 30.6 (L) 36.0 - 46.0 %   MCV 86.9 80.0 - 100.0 fL   MCH 28.4 26.0 - 34.0 pg   MCHC 32.7 30.0 - 36.0 g/dL   RDW 13.5 11.5 - 15.5 %   Platelets 221 150 - 400 K/uL   nRBC 0.0 0.0 - 0.2 %    Comment: Performed at [facility], [address] [place],  [address]    -------------------------------------------------------------------------- A&P:  Problem List Items Addressed This Visit    Insomnia  Major depressive disorder, recurrent, moderate (HCC) - Primary   Relevant Medications   sertraline (ZOLOFT) 50 MG tablet     Clinically major depression recurrent episode now in postpartum setting as likely trigger - Prior known depression history in past >6+ years ago, had been on other SSRI in past. Co morbid anxiety GAD insomnia however limited anxiety currently. - Recent pregnancy now postpartum, also Nexplanon (patient is questioning if this could be related) - Past meds: Fluoxetine (did well on, age 32 for 2 yrs), Celexa (side effect) and Escitalopram (side effect sleepy, for anxiety)  Plan 1. Start Sertarline (Zoloft) 50mg  daily AM with food, counseling on potential side effects risks, reviewed possible GI intolerance, insomnia (although likely to improve this given anxiety likely source of insomnia), black box warning inc suicidal (no prior history, unlikely concern) - anticipate 4-6 weeks for notable effect, may need titrate dose to 75mg  up to  100mg  in future  Start Melatonin for insomnia as well while SSRI takes effect  If ineffective or side effects we can switch to Fluoxetine that has worked in past for her.  F/u 4 weeks if need dose adjust or 3 months if doing well, she may start 1.5 pill dose 75mg  in 2-3 weeks if needed and discuss  Meds ordered this encounter  Medications  . sertraline (ZOLOFT) 50 MG tablet    Sig: Take 1 tablet (50 mg total) by mouth daily.    Dispense:  30 tablet    Refill:  2    Follow-up: - Return in 3 months for Mood Depression  Patient verbalizes understanding with the above medical recommendations including the limitation of remote medical advice.  Specific follow-up and call-back criteria were given for patient to follow-up or seek medical care more urgently if needed.   - Time spent in direct consultation with patient on phone: 11 minutes  Nobie Putnam, [facility] 08/10/2019, 9:43 AM

## 2019-09-02 ENCOUNTER — Other Ambulatory Visit: Payer: Self-pay | Admitting: Family Medicine

## 2019-09-02 DIAGNOSIS — F331 Major depressive disorder, recurrent, moderate: Secondary | ICD-10-CM

## 2019-09-07 ENCOUNTER — Telehealth: Payer: Self-pay

## 2019-09-07 NOTE — Telephone Encounter (Signed)
The pt called the after hours line requesting a increase on her anxiety medication. I contacted the patient to schedule an appt so she can discuss her concerns. The appt was scheduled for Friday, November 13th.

## 2019-09-18 ENCOUNTER — Ambulatory Visit: Payer: Self-pay | Admitting: Family Medicine

## 2019-09-23 ENCOUNTER — Other Ambulatory Visit: Payer: Self-pay

## 2019-09-23 ENCOUNTER — Ambulatory Visit (INDEPENDENT_AMBULATORY_CARE_PROVIDER_SITE_OTHER): Payer: 59 | Admitting: Family Medicine

## 2019-09-23 ENCOUNTER — Encounter: Payer: Self-pay | Admitting: Family Medicine

## 2019-09-23 DIAGNOSIS — F410 Panic disorder [episodic paroxysmal anxiety] without agoraphobia: Secondary | ICD-10-CM | POA: Diagnosis not present

## 2019-09-23 DIAGNOSIS — O99345 Other mental disorders complicating the puerperium: Secondary | ICD-10-CM | POA: Diagnosis not present

## 2019-09-23 DIAGNOSIS — F3341 Major depressive disorder, recurrent, in partial remission: Secondary | ICD-10-CM | POA: Diagnosis not present

## 2019-09-23 DIAGNOSIS — F411 Generalized anxiety disorder: Secondary | ICD-10-CM | POA: Diagnosis not present

## 2019-09-23 DIAGNOSIS — F53 Postpartum depression: Secondary | ICD-10-CM

## 2019-09-23 MED ORDER — SERTRALINE HCL 50 MG PO TABS: 75.0000 mg | ORAL_TABLET | Freq: Every day | ORAL | 1 refills | Status: DC

## 2019-09-23 NOTE — Progress Notes (Signed)
Virtual Visit via Telephone The purpose of this virtual visit is to provide medical care while limiting exposure to the novel coronavirus (COVID19) for both patient and office staff.  Consent was obtained for phone visit:  Yes.   Answered questions that patient had about telehealth interaction:  Yes.   I discussed the limitations, risks, security and privacy concerns of performing an evaluation and management service by telephone. I also discussed with the patient that there may be a patient responsible charge related to this service. The patient expressed understanding and agreed to proceed.  Patient Location: Home Provider Location: [facility])  ---------------------------------------------------------------------- Chief Complaint  Patient presents with  . Depression    S: Reviewed CMA documentation. I have called patient and gathered additional HPI as follows:  Major Depression, recurrent, in partial remission / GAD Anxiety - Last visit with me 08/10/19, for same problem depression, treated with sertraline '50mg'$  - previous other SSRI in past (Celexa, Fluoxetine, Escitalopram), see prior notes for background information. - Interval update with significant improvement in mood and anxiety now on med - Today patient reports her mood is much improved, mostly resolved, still has some anxiety at times, with stressors. She says worse part of mood was related to difficult birth and hospital complications. But she is mentally improving now. She is tolerating sertraline zoloft '50mg'$  daily well and thinks it is helpful. She does think it could be increased for more benefit. - Emotions are improved, has less crying spells and irritability, much improved Improved insomnia - Denies suicidal ideation  Denies any high risk travel to areas of current concern for COVID19. Denies any known or suspected exposure to person with or possibly with COVID19.  Denies any fevers, chills,  sweats, body ache, cough, shortness of breath, sinus pain or pressure, headache, abdominal pain, diarrhea  Past Medical History:  Diagnosis Date  . Anemia   . Anxiety   . Kidney stone    Social History   Tobacco Use  . Smoking status: Never Smoker  . Smokeless tobacco: Never Used  Substance Use Topics  . Alcohol use: No  . Drug use: No    Current Outpatient Medications:  .  etonogestrel (NEXPLANON) 68 MG IMPL implant, 1 each by Subdermal route once., Disp: , Rfl:  .  ferrous sulfate 325 (65 FE) MG EC tablet, Take 325 mg by mouth daily., Disp: , Rfl:  .  ibuprofen (ADVIL) 600 MG tablet, Take 1 tablet (600 mg total) by mouth every 6 (six) hours as needed for headache, mild pain, moderate pain or cramping., Disp: 60 tablet, Rfl: 0 .  sertraline (ZOLOFT) 50 MG tablet, Take 1.5 tablets (75 mg total) by mouth daily., Disp: 135 tablet, Rfl: 1  Depression screen [facility] 2/9 09/23/2019 08/10/2019 10/22/2018  Decreased Interest 0 2 0  Down, Depressed, Hopeless 0 3 0  PHQ - 2 Score 0 5 0  Altered sleeping 0 3 -  Tired, decreased energy 0 2 -  Change in appetite 0 1 -  Feeling bad or failure about yourself  0 0 -  Trouble concentrating 0 0 -  Moving slowly or fidgety/restless 0 0 -  Suicidal thoughts 0 0 -  PHQ-9 Score 0 11 -  Difficult doing work/chores Not difficult at all Not difficult at all -    GAD 7 : Generalized Anxiety Score 08/10/2019 04/05/2017 03/04/2017 03/04/2017  Nervous, Anxious, on Edge 0 '1 2 2  '$ Control/stop worrying '1 1 3 3  '$ Worry too much -  different things '1 1 3 3  '$ Trouble relaxing 0 0 0 0  Restless 0 0 0 0  Easily annoyed or irritable 2 0 0 0  Afraid - awful might happen 1 0 0 0  Total GAD 7 Score '5 3 8 8  '$ Anxiety Difficulty Not difficult at all Not difficult at all - Somewhat difficult    -------------------------------------------------------------------------- O: No physical exam performed due to remote telephone encounter.  Lab results reviewed.  Recent  Results (from the past 2160 hour(s))  HM PAP SMEAR     Status: None   Collection Time: 08/04/19 12:00 AM  Result Value Ref Range   HM Pap smear Negative for intraepithelial lesion or malignancy     Comment: [facility]      Pap IG, rfx HPV [facility]: 08/04/2019 1:35 PM [place] Component Name Value Ref Range  DIAGNOSIS: - [facility] Comment  Comment: NEGATIVE FOR INTRAEPITHELIAL LESION OR MALIGNANCY.   Specimen adequacy: - [facility] Comment  Comment: Satisfactory for evaluation. Endocervical and/or squamous metaplastic cells (endocervical component) are present.   Clinician provided ICD10: - [facility] Comment  Comment: Z39.2 Z12.4   PERFORMED BY: - [facility] Comment  Comment: Pincus Large, Cytotechnologist (ASCP)   . - [facility] .   Note: - [facility] Comment  Comment: The Pap smear is a screening test designed to aid in the detection of premalignant and malignant conditions of the uterine cervix. It is not a diagnostic procedure and should not be used as the sole means of detecting cervical cancer. Both false-positive and false-negative reports do occur.   Test Methodology: - [facility] Comment  Comment: This liquid based ThinPrep(R) pap test was screened with the use of an image guided system.   . - [facility] Comment  Comment: The HPV DNA reflex criteria were not met with this specimen result therefore, no HPV testing was performed.      -------------------------------------------------------------------------- A&P:  Problem List Items Addressed This Visit    Generalized anxiety disorder with panic attacks   Relevant Medications   sertraline (ZOLOFT) 50 MG tablet   Depression, major, recurrent, in partial remission ([place]) - Primary   Relevant Medications   sertraline (ZOLOFT) 50 MG tablet    Other Visit Diagnoses    Postpartum depression       Relevant Medications   sertraline (ZOLOFT) 50 MG tablet     Significantly improved  recurrent major depression now in partial remission Likely trigger with postpartum depression, following complicated birth hospitalization Associated anxiety and insomnia Improved on SSRI Sertraline Past meds: Fluoxetine (did well on, age 59 for 2 yrs), Celexa (side effect) and Escitalopram (side effect sleepy, for anxiety)  Plan 1. Agree to increase Sertraline from 50 to '75mg'$  daily - take 1.5 pills each day, new rx sent. Future can titrate up to '100mg'$  if needed 2. May continue Melatonin for insomnia as well while SSRI takes effect  Advised her to continue routine postpartum GYN follow-up as well in this period of adjusting to being a new mother with other life stressors.  Offered additional therapy with referral to therapist if need, declined today.  Meds ordered this encounter  Medications  . sertraline (ZOLOFT) 50 MG tablet    Sig: Take 1.5 tablets (75 mg total) by mouth daily.    Dispense:  135 tablet    Refill:  1    F33.1, dose increased from 50 to 75    Follow-up: - Return in 6 month follow-up Depression / Anxiety PHQ  GAD med   Patient verbalizes understanding with the above medical recommendations including the limitation of remote medical advice.  Specific follow-up and call-back criteria were given for patient to follow-up or seek medical care more urgently if needed.   - Time spent in direct consultation with patient on phone: 9 minutes   Nobie Putnam, [facility] 09/23/2019, 9:53 AM

## 2019-09-23 NOTE — Patient Instructions (Addendum)
Thank you for coming to the office today.  Increased Sertraline from 50 up to 75 - take 1.5 pills now. New rx sent  Follow-up with GYN as discussed.  Please schedule a Follow-up Appointment to: Return in about 6 months (around 03/22/2020) for 6 month follow-up Depression / Anxiety PHQ GAD med.  If you have any other questions or concerns, please feel free to call the office or send a message through [place]. You may also schedule an earlier appointment if necessary.  Additionally, you may be receiving a survey about your experience at our office within a few days to 1 week by e-mail or mail. We value your feedback.  Nobie Putnam, DO [place]

## 2019-11-12 ENCOUNTER — Other Ambulatory Visit: Payer: 59

## 2019-12-07 ENCOUNTER — Other Ambulatory Visit: Payer: Self-pay

## 2019-12-07 ENCOUNTER — Encounter: Payer: Self-pay | Admitting: Family Medicine

## 2019-12-07 ENCOUNTER — Ambulatory Visit (INDEPENDENT_AMBULATORY_CARE_PROVIDER_SITE_OTHER): Payer: 59 | Admitting: Family Medicine

## 2019-12-07 DIAGNOSIS — N3000 Acute cystitis without hematuria: Secondary | ICD-10-CM | POA: Diagnosis not present

## 2019-12-07 MED ORDER — NITROFURANTOIN MONOHYD MACRO 100 MG PO CAPS: 100.0000 mg | ORAL_CAPSULE | Freq: Two times a day (BID) | ORAL | 0 refills | Status: DC

## 2019-12-07 NOTE — Patient Instructions (Addendum)
Return tomorrow morning Tues 2/2 for 8am for urine sample dipstick and urine culture send out. Stay tuned on results.  AFTER urine sample, START Macrobid antibiotic 100mg  twice a day for 7 days If we need to change it or you don't improve contact us back.  If you feel concerned about a kidney stone and symptoms change or you develop fever chills nausea vomiting cannot take pills, then notify us or go to Hospital ED  Or can call Urologist. I believe they have seen you in the past  [facility] -[address] [place],  [place]  [address] Phone: [phone]  Please schedule a Follow-up Appointment to: Return in about 1 week (around 12/14/2019), or if symptoms worsen or fail to improve, for UTI.  If you have any other questions or concerns, please feel free to call the office or send a message through [place]. You may also schedule an earlier appointment if necessary.  Additionally, you may be receiving a survey about your experience at our office within a few days to 1 week by e-mail or mail. We value your feedback.  Nobie Putnam, DO [place]

## 2019-12-07 NOTE — Progress Notes (Signed)
Virtual Visit via Telephone The purpose of this virtual visit is to provide medical care while limiting exposure to the novel coronavirus (COVID19) for both patient and office staff.  Consent was obtained for phone visit:  Yes.   Answered questions that patient had about telehealth interaction:  Yes.   I discussed the limitations, risks, security and privacy concerns of performing an evaluation and management service by telephone. I also discussed with the patient that there may be a patient responsible charge related to this service. The patient expressed understanding and agreed to proceed.  Patient Location: Home Provider Location: [facility])  ---------------------------------------------------------------------- Chief Complaint  Patient presents with  . Urinary Tract Infection    onset 2 weeks as per patient doesn't know if it is nephrolithiasis feels like same pain which start out mild and then gets severe    S: Reviewed CMA documentation. I have called patient and gathered additional HPI as follows:  DYSURIA / Low Back Pain / Suspected UTI / History of Nephrolithiasis Reports that symptoms started 2 weeks ago, with dysuria for few weeks, then recently started to have some low back pain both sides episodic, not always related to urinating. She is not having any urinary frequency or difficulty voiding. In past usually similar to UTI resolve w/ Macrobid or keflex, had itching reaction to keflex before.  Denies any high risk travel to areas of current concern for COVID19. Denies any known or suspected exposure to person with or possibly with COVID19.  Admits urine looks foamy with no obvious blood or odor Denies nausea vomiting Denies any fevers, chills, sweats, body ache, abdominal pain, diarrhea  Past Medical History:  Diagnosis Date  . Anemia   . Anxiety   . Kidney stone    Social History   Tobacco Use  . Smoking status: Never Smoker  . Smokeless  tobacco: Never Used  Substance Use Topics  . Alcohol use: No  . Drug use: No    Current Outpatient Medications:  .  etonogestrel (NEXPLANON) 68 MG IMPL implant, 1 each by Subdermal route once., Disp: , Rfl:  .  ferrous sulfate 325 (65 FE) MG EC tablet, Take 325 mg by mouth daily., Disp: , Rfl:  .  ibuprofen (ADVIL) 600 MG tablet, Take 1 tablet (600 mg total) by mouth every 6 (six) hours as needed for headache, mild pain, moderate pain or cramping., Disp: 60 tablet, Rfl: 0 .  sertraline (ZOLOFT) 50 MG tablet, Take 1.5 tablets (75 mg total) by mouth daily., Disp: 135 tablet, Rfl: 1 .  nitrofurantoin, macrocrystal-monohydrate, (MACROBID) 100 MG capsule, Take 1 capsule (100 mg total) by mouth 2 (two) times daily. For 7 days, Disp: 14 capsule, Rfl: 0  Depression screen [facility] 2/9 12/07/2019 09/23/2019 08/10/2019  Decreased Interest 0 0 2  Down, Depressed, Hopeless 0 0 3  PHQ - 2 Score 0 0 5  Altered sleeping - 0 3  Tired, decreased energy - 0 2  Change in appetite - 0 1  Feeling bad or failure about yourself  - 0 0  Trouble concentrating - 0 0  Moving slowly or fidgety/restless - 0 0  Suicidal thoughts - 0 0  PHQ-9 Score - 0 11  Difficult doing work/chores - Not difficult at all Not difficult at all    GAD 7 : Generalized Anxiety Score 08/10/2019 04/05/2017 03/04/2017 03/04/2017  Nervous, Anxious, on Edge 0 1 2 2   Control/stop worrying 1 1 3 3   Worry too much - different  things 1 1 3 3   Trouble relaxing 0 0 0 0  Restless 0 0 0 0  Easily annoyed or irritable 2 0 0 0  Afraid - awful might happen 1 0 0 0  Total GAD 7 Score 5 3 8 8   Anxiety Difficulty Not difficult at all Not difficult at all - Somewhat difficult    -------------------------------------------------------------------------- O: No physical exam performed due to remote telephone encounter.  Lab results reviewed.  No results found for this or any previous visit (from the past 2160  hour(s)).  -------------------------------------------------------------------------- A&P:  Problem List Items Addressed This Visit    None    Visit Diagnoses    Acute cystitis without hematuria    -  Primary   Relevant Medications   nitrofurantoin, macrocrystal-monohydrate, (MACROBID) 100 MG capsule     Clinically consistent with UTI based on history and prior episodes No urine sample today, will return tomorrow 12/08/19 8AM for nurse visit urine dipstick + urine culture Sent rx Macrobid 100 BID x 7 day, start TOMORROW after urine sample Improve hydration Return criteria reviewed, discussed possibility of nephrolithiasis and symptoms to look out for, if need to change antibiotic or approach can f/u sooner and discuss, she has seen urology before may reconsider BUA if needed   Meds ordered this encounter  Medications  . nitrofurantoin, macrocrystal-monohydrate, (MACROBID) 100 MG capsule    Sig: Take 1 capsule (100 mg total) by mouth 2 (two) times daily. For 7 days    Dispense:  14 capsule    Refill:  0    Follow-up: - Return in 1 day for urine sample, then 1 week as needed  Patient verbalizes understanding with the above medical recommendations including the limitation of remote medical advice.  Specific follow-up and call-back criteria were given for patient to follow-up or seek medical care more urgently if needed.   - Time spent in direct consultation with patient on phone: 8 minutes  Nobie Putnam, [facility] 12/07/2019, 2:00 PM

## 2019-12-08 ENCOUNTER — Other Ambulatory Visit: Payer: Self-pay

## 2019-12-08 ENCOUNTER — Other Ambulatory Visit (INDEPENDENT_AMBULATORY_CARE_PROVIDER_SITE_OTHER): Payer: 59

## 2019-12-08 DIAGNOSIS — N390 Urinary tract infection, site not specified: Secondary | ICD-10-CM

## 2019-12-08 DIAGNOSIS — N3000 Acute cystitis without hematuria: Secondary | ICD-10-CM

## 2019-12-08 LAB — POCT URINALYSIS DIPSTICK
Bilirubin, UA: NEGATIVE
Blood, UA: NEGATIVE
Glucose, UA: NEGATIVE
Ketones, UA: NEGATIVE
Leukocytes, UA: NEGATIVE
Nitrite, UA: NEGATIVE
Protein, UA: NEGATIVE
Spec Grav, UA: 1.01 (ref 1.010–1.025)
Urobilinogen, UA: 0.2 E.U./dL
pH, UA: 5 (ref 5.0–8.0)

## 2019-12-10 LAB — URINE CULTURE
MICRO NUMBER:: 10106850
Result:: NO GROWTH
SPECIMEN QUALITY:: ADEQUATE

## 2019-12-29 ENCOUNTER — Encounter: Payer: Self-pay | Admitting: Family Medicine

## 2019-12-29 ENCOUNTER — Other Ambulatory Visit: Payer: Self-pay

## 2019-12-29 ENCOUNTER — Ambulatory Visit (INDEPENDENT_AMBULATORY_CARE_PROVIDER_SITE_OTHER): Payer: 59 | Admitting: Family Medicine

## 2019-12-29 VITALS — BP 118/72 | HR 98 | Temp 98.4°F | Resp 16 | Ht 63.0 in | Wt 166.0 lb

## 2019-12-29 DIAGNOSIS — E663 Overweight: Secondary | ICD-10-CM

## 2019-12-29 DIAGNOSIS — M79662 Pain in left lower leg: Secondary | ICD-10-CM | POA: Diagnosis not present

## 2019-12-29 DIAGNOSIS — R635 Abnormal weight gain: Secondary | ICD-10-CM | POA: Diagnosis not present

## 2019-12-29 DIAGNOSIS — R252 Cramp and spasm: Secondary | ICD-10-CM

## 2019-12-29 MED ORDER — BACLOFEN 10 MG PO TABS: 5.0000 mg | ORAL_TABLET | Freq: Two times a day (BID) | ORAL | 1 refills | Status: DC | PRN

## 2019-12-29 NOTE — Patient Instructions (Addendum)
Thank you for coming to the office today.  Leg cramps - Try spoonful of yellow mustard to relieve leg cramps or try daily to prevent the problem  - OTC natural option is Hyland's Leg Cramps (Dissolving tablet) take as needed for muscle cramps  - Try an electrolyte drink during day to stay hydrated as well.  -----------  Start taking Baclofen (Lioresal) 10mg  (muscle relaxant) - start with half (cut) to one whole pill at night as needed for next 1-3 nights (may make you drowsy, caution with driving) see how it affects you, then if tolerated increase to one pill 2 to 3 times a day or (every 8 hours as needed)  Recommend trial of Anti-inflammatory with Naproxen or Aleve OTC 220mg  tabs - take one or two with food and plenty of water TWICE daily every day (breakfast and dinner), for next 1 week or less - DO NOT TAKE any ibuprofen, motrin while you are taking this medicine  - It is safe to take Tylenol Ext Str 500mg  tabs - take 1 to 2 (max dose 1000mg ) every 6 hours as needed for breakthrough pain, max 24 hour daily dose is 6 to 8 tablets or 4000mg   Heat better cramping. Ice is better for swelling  ---- If significantly worse - swelling on L side, redness, worse pain, breathing symptoms, or any other acute concerns with sudden change, call or message urgently or if need can go to hospital / urgent care. We can try to order an ultrasound if indicated.  For weight - Try to increase regular cardio exercise 15-20 min sustained higher heart rate cardio up to 150 min a week.  - Could be Nexplanon (14% wt gain adverse side effect)  Please schedule a Follow-up Appointment to: Return if symptoms worsen or fail to improve.  If you have any other questions or concerns, please feel free to call the office or send a message through [place]. You may also schedule an earlier appointment if necessary.  Additionally, you may be receiving a survey about your experience at our office within a few days to 1  week by e-mail or mail. We value your feedback.  Nobie Putnam, DO [place]

## 2019-12-29 NOTE — Progress Notes (Signed)
Subjective:    Patient ID: Patricia Pearson, female    DOB: 06-08-1996, 24 y.o.   MRN: [id number]  Patricia Pearson is a 24 y.o. female presenting on 12/29/2019 for calf pain (left side onset 6 days crampy, painful denies swelling or redness Hx in past)   HPI   Left Leg Pain / Cramping Reports x 4 nights with waking up with cramping pain in calf with moderate to severe pain, seems to be sore and painful during day worse if on feet, and some episodic cramping - Tried heating pad last x 2 nights, with some improvement - History of septic thrombophlebitis during last pregnancy. She has fam history of maternal grandfather with DVT and heart condition.  - GYN placed her on OCP 2 month post partum has continued this and advised to seek care if concern of blood clot. No menstrual cycle for past 2 months, while on OCP. - Denies redness, swelling, numbness or tingling  Abnormal Weight gain / BMI >29 Down to 154 lbs 10 days ago, then gained it back quickly. She has been dieting since postpartum period >6 months ago. Weight seems to fluctuate rapidly. Eating limited portions. Fruit for breakfast, meal replacement shake for lunch. Balanced/anything for dinner. No snacks between. Walking a lot at work for exercise, high # of steps. Not regular exercise however.    Depression screen [facility] 2/9 12/07/2019 09/23/2019 08/10/2019  Decreased Interest 0 0 2  Down, Depressed, Hopeless 0 0 3  PHQ - 2 Score 0 0 5  Altered sleeping - 0 3  Tired, decreased energy - 0 2  Change in appetite - 0 1  Feeling bad or failure about yourself  - 0 0  Trouble concentrating - 0 0  Moving slowly or fidgety/restless - 0 0  Suicidal thoughts - 0 0  PHQ-9 Score - 0 11  Difficult doing work/chores - Not difficult at all Not difficult at all    Social History   Tobacco Use  . Smoking status: Never Smoker  . Smokeless tobacco: Never Used  Substance Use Topics  . Alcohol use: No  . Drug use: No    Review of  Systems Per HPI unless specifically indicated above     Objective:    BP 118/72   Pulse 98   Temp 98.4 F (36.9 C) (Oral)   Resp 16   Ht 5\' 3"  (1.6 m)   Wt 166 lb (75.3 kg)   SpO2 100%   BMI 29.41 kg/m   Wt Readings from Last 3 Encounters:  12/29/19 166 lb (75.3 kg)  06/19/19 169 lb 1.5 oz (76.7 kg)  06/15/19 166 lb (75.3 kg)    Physical Exam Vitals and nursing note reviewed.  Constitutional:      General: She is not in acute distress.    Appearance: She is well-developed. She is not diaphoretic.     Comments: Well-appearing, comfortable, cooperative  HENT:     Head: Normocephalic and atraumatic.  Eyes:     General:        Right eye: No discharge.        Left eye: No discharge.     Conjunctiva/sclera: Conjunctivae normal.  Neck:     Thyroid: No thyromegaly.  Cardiovascular:     Rate and Rhythm: Normal rate and regular rhythm.     Heart sounds: Normal heart sounds. No murmur.  Pulmonary:     Effort: Pulmonary effort is normal. No respiratory distress.     Breath  sounds: Normal breath sounds. No wheezing or rales.  Musculoskeletal:        General: Tenderness (LEFT posterior calf only midline) present. No swelling or deformity. Normal range of motion.     Cervical back: Normal range of motion and neck supple.     Right lower leg: No edema.     Left lower leg: No edema.     Comments: No erythema or ecchymosis lower extremity.  Lymphadenopathy:     Cervical: No cervical adenopathy.  Skin:    General: Skin is warm and dry.     Findings: No erythema or rash.  Neurological:     Mental Status: She is alert and oriented to person, place, and time.  Psychiatric:        Behavior: Behavior normal.     Comments: Well groomed, good eye contact, normal speech and thoughts       Results for orders placed or performed in visit on 12/08/19  POCT Urinalysis Dipstick  Result Value Ref Range   Color, UA amber    Clarity, UA clear    Glucose, UA Negative Negative    Bilirubin, UA Negative    Ketones, UA Negative    Spec Grav, UA 1.010 1.010 - 1.025   Blood, UA Negative    pH, UA 5.0 5.0 - 8.0   Protein, UA Negative Negative   Urobilinogen, UA 0.2 0.2 or 1.0 E.U./dL   Nitrite, UA Negative    Leukocytes, UA Negative Negative   Appearance clear    Odor        Assessment & Plan:   Problem List Items Addressed This Visit    None    Visit Diagnoses    Cramps of left lower extremity    -  Primary   Relevant Medications   baclofen (LIORESAL) 10 MG tablet   Pain of left calf       Relevant Medications   baclofen (LIORESAL) 10 MG tablet   Abnormal weight gain       Overweight (BMI 25.0-29.9)          #Left Calf cramping Clinically most likely MSK etiology muscle cramping, seems predictable during day with standing/activity and night-time. - Well's DVT criteria score is 1 (low risk group, 1 from pain along deep vein area seems fairly localized in calf), otherwise no swelling redness or other associated concerns.  History of superficial septic thrombophlebitis w/ pregnancy, however only other concern is on Nexplanon implant that could raise risk of VTE, however no other clinical concerns that increase risk of DVT. She has some remote fam history of DVT but not a strong history  Exam is benign and reassuring.  We mutually agree to defer acute DVT work up today and will closely monitor her.  Improving with NSAID/Tylenol PRN, heat - which is reassuring.  Trial on Baclofen 5-10mg  BID PRN prefer night-time dosing, caution sedation. Use home remedy as advised for cramping - mustard, OTC Hyland's leg cramp, hydration w/ electrolytes  If significant worsening or change as discussed, or signs of DVT, she is asking to go directly to hospital ED / urgent care, or contact us and we can arrange stat imaging if indicated.  ------------------- #Abnormal weight, Overweight BMI >29 Difficulty wt loss 4-6 month postpartum, also on nexplanon implant since  09/2019, has 14% wt gain adverse event reported, suspect this may be contributing. - Current diet seems appropriate, few adjustments to breakfast, inc protein, limit sugar/fruit - Goal to improve cardio exercise higher intensity  other than walking for work, up to 100-150 min a week - Follow-up within 3-6 months if need, future if BMI >30 and still problematic can consider referral to wt management program   Meds ordered this encounter  Medications  . baclofen (LIORESAL) 10 MG tablet    Sig: Take 0.5-1 tablets (5-10 mg total) by mouth 2 (two) times daily as needed for muscle spasms.    Dispense:  30 each    Refill:  1      Follow up plan: Return if symptoms worsen or fail to improve.    Nobie Putnam, DO [facility] 12/29/2019, 8:07 AM

## 2020-01-08 ENCOUNTER — Other Ambulatory Visit: Payer: Self-pay | Admitting: Family Medicine

## 2020-01-08 DIAGNOSIS — R252 Cramp and spasm: Secondary | ICD-10-CM

## 2020-01-08 DIAGNOSIS — M79662 Pain in left lower leg: Secondary | ICD-10-CM

## 2020-01-23 ENCOUNTER — Other Ambulatory Visit: Payer: Self-pay | Admitting: Family Medicine

## 2020-01-23 DIAGNOSIS — M79662 Pain in left lower leg: Secondary | ICD-10-CM

## 2020-01-23 DIAGNOSIS — R252 Cramp and spasm: Secondary | ICD-10-CM

## 2020-03-15 ENCOUNTER — Other Ambulatory Visit: Payer: Self-pay

## 2020-03-15 ENCOUNTER — Encounter: Payer: Self-pay | Admitting: Family Medicine

## 2020-03-15 ENCOUNTER — Ambulatory Visit: Payer: Self-pay

## 2020-03-15 ENCOUNTER — Ambulatory Visit (INDEPENDENT_AMBULATORY_CARE_PROVIDER_SITE_OTHER): Payer: BC Managed Care – PPO | Admitting: Family Medicine

## 2020-03-15 DIAGNOSIS — M5440 Lumbago with sciatica, unspecified side: Secondary | ICD-10-CM

## 2020-03-15 NOTE — Progress Notes (Signed)
Office Visit Note   Patient: Patricia Pearson           Date of Birth: [birth year]           MRN: [id number] Visit Date: 03/15/2020 Requested by: Olin Hauser, DO [address] [place],  [place] [address] PCP: Olin Hauser, DO  Subjective: Chief Complaint  Patient presents with  . Lower Back - Pain    Pain middle part of lower back x 3 months. Numbness in right leg, usually occurs while driving. S/p childbirth (vaginal) last August. She did have an epidural. She says she did not have this pain until after childbirth. Dull pain.    HPI: She is here with low back pain.  Symptoms started about 3 months ago, 6 months after having a vaginal delivery with an epidural.  During her pregnancy and delivery she did not have any back pain.  She was fine for the first 6 months.  She does not recall an injury to triggered the onset of her pain.  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  When she starts to get sore, she puts her foot up on a stool for support.  Denies any radicular pain.  Ibuprofen helps a little bit.  She never had problems with her back before.  She does have a history of kidney stones.              ROS: No fevers or chills, no bowel or bladder dysfunction.  All other systems were reviewed and are negative.  Objective: Vital Signs: There were no vitals taken for this visit.  Physical Exam:  General:  Alert and oriented, in no acute distress. Pulm:  Breathing unlabored. Psy:  Normal mood, congruent affect. Skin: No visible rash. Low back: She is tender near the L5-S1 level mainly in the paraspinous muscles which are very tight and tender.  No pain over the SI joints, no pain with sciatic notch.  Negative straight leg raise, no pain with internal/external hip rotation.  Hip range of motion is good.  Lower extremity strength and reflexes are normal.  Imaging: XR Lumbar Spine 2-3 Views  Result Date:  03/15/2020 X-rays lumbar spine: She has a bifid S1 spinous process.  Otherwise disc spaces are well-preserved, no other congenital deformities.  No sign of compression fracture or neoplasm.   Assessment & Plan: 1.  Low back pain, suspect myofascial pain.  Nonfocal neurologic exam. -We will try physical therapy near [place] where she lives and works. -We will empirically treat with vitamin D3. -If she has not noticed any improvement after 3 to 4 weeks, she will contact me and we will order lumbar MRI scan.     Procedures: No procedures performed  No notes on file     PMFS History: Patient Active Problem List   Diagnosis Date Noted  . Depression, major, recurrent, in partial remission ([place]) 08/10/2019  . Insomnia 08/10/2019  . Fever of unknown origin following delivery, postpartum 06/18/2019  . Indication for care or intervention related to labor and delivery 06/15/2019  . Dizziness 06/15/2019  . Severe preeclampsia 06/15/2019  . Uterine contractions during pregnancy 06/07/2019  . Flank pain in pregnant patient 04/12/2019  . Pelvic pressure in pregnancy, antepartum, third trimester 04/11/2019  . Anemia 03/04/2017  . Generalized anxiety disorder with panic attacks 03/04/2017  . Chronic tension-type headache, not intractable 09/06/2016  . Chronic right flank pain 12/07/2014  .  History of kidney stones 12/07/2014  . History of hematuria 12/07/2014  . Menorrhagia 04/28/2014   Past Medical History:  Diagnosis Date  . Anemia   . Anxiety   . Kidney stone     Family History  Problem Relation Age of Onset  . Basal cell carcinoma Mother   . Anxiety disorder Mother   . Drug abuse Father   . Peptic Ulcer Father   . Heart failure Maternal Grandfather   . Lung cancer Paternal Grandmother   . Colon cancer Neg Hx   . Cervical cancer Neg Hx   . Breast cancer Neg Hx     Past Surgical History:  Procedure Laterality Date  . STENT PLACEMENT RT URETER ([place] [facility]) Right 04/09/2013  .  WISDOM TOOTH EXTRACTION  2016   Social History   Occupational History  . 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 Smoking status: Never Smoker  . Smokeless tobacco: Never Used  Substance and Sexual Activity  . Alcohol use: No  . Drug use: No  . Sexual activity: Yes    Birth control/protection: Pill

## 2020-03-22 DIAGNOSIS — D225 Melanocytic nevi of trunk: Secondary | ICD-10-CM | POA: Diagnosis not present

## 2020-03-22 DIAGNOSIS — L905 Scar conditions and fibrosis of skin: Secondary | ICD-10-CM | POA: Diagnosis not present

## 2020-03-25 DIAGNOSIS — M6281 Muscle weakness (generalized): Secondary | ICD-10-CM | POA: Diagnosis not present

## 2020-03-25 DIAGNOSIS — M545 Low back pain: Secondary | ICD-10-CM | POA: Diagnosis not present

## 2020-04-05 DIAGNOSIS — M545 Low back pain: Secondary | ICD-10-CM | POA: Diagnosis not present

## 2020-04-05 DIAGNOSIS — M6281 Muscle weakness (generalized): Secondary | ICD-10-CM | POA: Diagnosis not present

## 2020-04-08 DIAGNOSIS — M545 Low back pain: Secondary | ICD-10-CM | POA: Diagnosis not present

## 2020-04-08 DIAGNOSIS — M6281 Muscle weakness (generalized): Secondary | ICD-10-CM | POA: Diagnosis not present

## 2020-04-15 DIAGNOSIS — M545 Low back pain: Secondary | ICD-10-CM | POA: Diagnosis not present

## 2020-04-15 DIAGNOSIS — M6281 Muscle weakness (generalized): Secondary | ICD-10-CM | POA: Diagnosis not present

## 2020-04-18 DIAGNOSIS — M545 Low back pain: Secondary | ICD-10-CM | POA: Diagnosis not present

## 2020-04-18 DIAGNOSIS — M6281 Muscle weakness (generalized): Secondary | ICD-10-CM | POA: Diagnosis not present

## 2020-04-20 ENCOUNTER — Telehealth: Payer: Self-pay

## 2020-04-20 NOTE — Telephone Encounter (Signed)
Called patient twice and also mother unable to reach to find out the reason for Holter monitor. Left message to call back.

## 2020-04-20 NOTE — Telephone Encounter (Signed)
Copied from [place] [phone]. Topic: General - Other >> Apr 20, 2020  1:01 PM Mcneil, Ja-Kwan wrote: Reason for CRM: Pt called to ask if the office has a holter monitor or if she needs to contact cardiologist office. Pt requests call back

## 2020-04-21 NOTE — Telephone Encounter (Signed)
Attempted to call patient about inquiry in note- left message to return call to office.

## 2020-04-21 NOTE — Telephone Encounter (Signed)
Since unable to reach the patient my chart message send.

## 2020-04-22 DIAGNOSIS — M6281 Muscle weakness (generalized): Secondary | ICD-10-CM | POA: Diagnosis not present

## 2020-04-22 DIAGNOSIS — M545 Low back pain: Secondary | ICD-10-CM | POA: Diagnosis not present

## 2020-04-25 DIAGNOSIS — M545 Low back pain: Secondary | ICD-10-CM | POA: Diagnosis not present

## 2020-04-25 DIAGNOSIS — M6281 Muscle weakness (generalized): Secondary | ICD-10-CM | POA: Diagnosis not present

## 2020-04-29 ENCOUNTER — Other Ambulatory Visit: Payer: Self-pay

## 2020-04-29 ENCOUNTER — Encounter: Payer: Self-pay | Admitting: Family Medicine

## 2020-04-29 ENCOUNTER — Ambulatory Visit: Payer: 59 | Admitting: Family Medicine

## 2020-04-29 VITALS — BP 116/83 | HR 92 | Temp 97.7°F | Resp 16 | Ht 63.0 in | Wt 167.0 lb

## 2020-04-29 DIAGNOSIS — E663 Overweight: Secondary | ICD-10-CM | POA: Diagnosis not present

## 2020-04-29 DIAGNOSIS — R002 Palpitations: Secondary | ICD-10-CM

## 2020-04-29 DIAGNOSIS — F3340 Major depressive disorder, recurrent, in remission, unspecified: Secondary | ICD-10-CM | POA: Diagnosis not present

## 2020-04-29 DIAGNOSIS — R0789 Other chest pain: Secondary | ICD-10-CM | POA: Diagnosis not present

## 2020-04-29 NOTE — Patient Instructions (Addendum)
Thank you for coming to the office today.  Stay tuned for apt from Cardiologist.  [facility]) [facility] at [place], [place] [address] Main: [phone]   Keep limiting caffeine. May reduce soda and possibly if get a caffeine headache, either use Tylenol/Ibuprofen OR use Excedrin migraine (caffeine) as needed.  For weight loss / nutrition  Eat at least 3 meals and 1-2 snacks per day (don't skip breakfast).  Aim for no more than 5 hours between eating. - Tip: If you go >5 hours without eating and become very hungry, your body will supply it's own resources temporarily and you can gain extra weight when you eat.  The 5 Minute Rule of Exercise - Promise yourself to at least do 5 minutes of exercise (make sure you time it), and if at the end of 5 minutes (this is the hardest part of the work-out), if you still feel like you want stop (or not motivated to continue) then allow yourself to stop. Otherwise, more often than not you will feel encouraged that you can continue for a little while longer or even more!   LIMIT Starchy (carb) foods include: Bread, rice, pasta, potatoes, corn, crackers, bagels, muffins, all baked goods.   RECOMMEND Protein foods include: Meat, fish, poultry, eggs, dairy foods, and beans such as pinto and kidney beans (beans also provide carbohydrate).   1. Eat at least 3 meals and 1-2 snacks per day. Never go more than 4-5 hours while awake without eating.   2. Limit starchy foods to TWO per meal and ONE per snack. ONE portion of a starchy  food is equal to the following:   - ONE slice of bread (or its equivalent, such as half of a hamburger bun).   - 1/2 cup of a "scoopable" starchy food such as potatoes or rice.   - 1 OUNCE (28 grams) of starchy snacks (crackers or pretzels, look on label).   - 15 grams of carbohydrate as shown on food label.   3. Both lunch and dinner should include a protein food, a carb  food, and vegetables.   - Obtain twice as many veg's as protein or carbohydrate foods for both lunch and dinner.   - Try to keep frozen veg's on hand for a quick vegetable serving.     - Fresh or frozen veg's are best.   4. Breakfast should always include protein.      Please schedule a Follow-up Appointment to: Return in about 3 months (around 07/30/2020) for 3 -6 month Weight Check.  If you have any other questions or concerns, please feel free to call the office or send a message through [place]. You may also schedule an earlier appointment if necessary.  Additionally, you may be receiving a survey about your experience at our office within a few days to 1 week by e-mail or mail. We value your feedback.  Nobie Putnam, DO [facility], [facility]   Ketogenic Eating Plan, Adult A ketogenic eating plan is a diet that is very low in carbohydrates, moderately low in protein, and very high in fat. Your body normally gets energy from eating carbohydrates. If you limit carbohydrates in your diet, your body will start to use stored fat as an energy source. When fat is broken down for energy, it enters your blood as a substance called ketones. A ketogenic eating plan relies mostly on ketones for energy. This eating plan can cause rapid weight  loss because the body burns fat for fuel. What are the benefits of a ketogenic eating plan? Epilepsy A ketogenic diet is sometimes used to treat epilepsy in children who have seizures that are not helped by medicines. As a treatment for epilepsy in children, ketogenic eating plans have been well studied and used for many years. This type of diet is usually started in the hospital and carefully monitored by a health care team. It is usually tried for several months to see if it will lessen seizures. Other conditions Ketogenic eating plans have also been studied to help treat other conditions, including:  Cancer.  Type 2  diabetes.  Alzheimer's disease.  Parkinson's disease.  Autism.  Multiple sclerosis. More studies need to be done to see how well this eating plan works for these and other conditions and what the long-term side effects might be. What are the side effects of a ketogenic eating plan? Side effects of a ketogenic diet may include:  Vitamin deficiencies.  Loss of body fluids (dehydration).  Bad breath.  Nausea.  Hunger.  Difficulty passing stool (constipation).  Problems with menstrual periods.  Inflammation of the pancreas.  Kidney stones or gall bladder stones.  Bone fractures.  High cholesterol. What are tips for following this plan? Reading food labels  Look for foods that have a low glycemic index (GI) label.  Read food ingredient lists to check for any hidden or added sugar.  Check food labels for the number of grams of carbohydrates and protein. This ensures that you are eating the right amounts of these nutrients. This is important. Cooking  Carefully measure or weigh foods.  Make desserts using ketogenic or low GI recipes.  Avoid cooking with sauces that contain added sugar, such as barbecue sauce or ketchup. Meal planning  Aim for a daily meal and snack time schedule that you can follow consistently.  Every meal will include high-fat items such as avocado, cream, butter, or mayonnaise. General information   Buy a gram scale to weigh foods. This is necessary to follow this diet correctly and accurately. Your dietitian will teach you how to use one for this diet.  Ask your health care provider what steps you can take to avoid side effects of this eating plan, such as constipation or kidney stones.  Take vitamin and mineral supplements only as told by a health care provider or dietitian.  Work with your dietitian and health care provider to handle the key challenges of this diet and to help you stay on track. What foods should I eat?  Fruits Fresh  pineapple, peaches, and apples. Other fresh and frozen fruits in small amounts. Vegetables Lettuce. Beets. Bok choy. Eggplant. Tomatoes. Turnips. Cucumbers. Peppers. Radishes. Cauliflower. Zucchini. Fennel. Swiss chard. Grains Whole wheat bread. Bran cereal. Brown rice. Whole wheat pasta. Low GI cereals. Meats and other proteins Meat, poultry, and fish. Eggs. Egg substitutes. Nuts, seeds, lentils, and split peas in small amounts. Berniece Salines. Dairy Cheese in moderate amounts. Beverages Plain water. Sugar-free, caffeine-free beverages. Mineral water or club soda. Caffeine-free, carbohydrate-free herbal tea. Fats and oils Avocado. Cream. Sour cream. Cream cheese. Butter. Plant-based oils, such as olive, canola, and sunflower. Margarine. Mayonnaise. Sweets and desserts Any homemade sweets or desserts made using ketogenic diet recipes. The items listed above may not be a complete list of recommended foods and beverages. Contact a dietitian for more information. What foods should I avoid? Fruits Fruit juice. Fruits packed in syrups. Dried or candied fruits. Vegetables Corn. Potatoes. Peas.  Grains All bread, dry cereals, and cooked cereals with added sugar. Baked goods. Crackers and pretzels. Meats and other proteins Meat, poultry, or fish prepared with flour or breading. Nut butters with added sugar. Beans. Dairy Milk. Yogurt. Fats and oils Salad dressings with added sugar. Gravies. Beverages Sugar-sweetened teas, coffee drinks, or soft drinks. Juice. Sports drinks. Sweets and desserts All sweets and desserts, unless the dessert is homemade using ketogenic diet recipes. The items listed above may not be a complete list of foods and beverages to avoid. Contact a dietitian for more information. Summary  A ketogenic eating plan is a diet that is very low in carbohydrates, moderately low in protein, and very high in fat.  Aim for a daily meal and snack time schedule that you can follow  consistently.  Buy a gram scale to weigh foods. This is necessary to follow this diet correctly and accurately. Your dietitian will teach you how to use one for this diet.  Work closely with your health care provider and a dietitian while you are following a ketogenic eating plan. This information is not intended to replace advice given to you by your health care provider. Make sure you discuss any questions you have with your health care provider. Document Revised: 02/27/2018 Document Reviewed: 02/27/2018 Elsevier Patient Education  [place].

## 2020-04-29 NOTE — Progress Notes (Signed)
Subjective:    Patient ID: Patricia Pearson, female    DOB: December 23, 1995, 24 y.o.   MRN: [id number]  Patricia Pearson is a 24 y.o. female presenting on 04/29/2020 for Palpitations (SOB, chest pain feels like she is skipping heart beat --few months but from past couple of weeks was few days and now everyday as per pt )   HPI    Palpitatoins, intermittent associated dyspnea / atypical chest discomfort Reports prior background history of tachycardia during her recent pregnancy and had EKGs and evaluation at that time, thought to have improved after delivery. Now 10 months after pregnancy, still has symptoms of episodes of intermittent palpitations with rapid heart rate, associated features of dyspnea and aching soreness in mid chest and back mostly mild pain. Initially symptoms were more infrequent less often at that time were lasting about < 30 min approx. Now occurring episodes about every day or sometimes multiple times a day. Lasting about 1-2 hours at a time. - Thought maybe it was anxiety or stress, however could not correlate with stressful situations. May develop episode when resting or not active. - She has reduced caffeine intake. Now only 1 soda a day, it does flare up symptoms more, seems fairly predictable with triggering her symptoms. No more coffee. Otherwise drinks water. - Often will have episode when wake up in AM. - She had longest episode on a Sunday recently, 2 weeks ago approx, had severe symptoms lasted for about 4 hours, almost to went to hospital. Felt dizziness with that one. Seems may have been triggered by mountain dew. - History of complicated pregnancy with septic thrombophlebitis. She had high resting HR 150-160 in hospital.  Overweight BMI >29 She has concerns with weight. Difficulty losing weight following pregnancy. She is asking for weight loss medication today. Has tried some diet strategies but nothing longer term. Not regularly exercising. She is interested to  do calorie counting.    Depression screen [facility] 2/9 04/29/2020 12/07/2019 09/23/2019  Decreased Interest 0 0 0  Down, Depressed, Hopeless 0 0 0  PHQ - 2 Score 0 0 0  Altered sleeping 0 - 0  Tired, decreased energy 0 - 0  Change in appetite 0 - 0  Feeling bad or failure about yourself  0 - 0  Trouble concentrating 0 - 0  Moving slowly or fidgety/restless 0 - 0  Suicidal thoughts 0 - 0  PHQ-9 Score 0 - 0  Difficult doing work/chores Not difficult at all - Not difficult at all   GAD 7 : Generalized Anxiety Score 08/10/2019 04/05/2017 03/04/2017 03/04/2017  Nervous, Anxious, on Edge 0 1 2 2   Control/stop worrying 1 1 3 3   Worry too much - different things 1 1 3 3   Trouble relaxing 0 0 0 0  Restless 0 0 0 0  Easily annoyed or irritable 2 0 0 0  Afraid - awful might happen 1 0 0 0  Total GAD 7 Score 5 3 8 8   Anxiety Difficulty Not difficult at all Not difficult at all - Somewhat difficult     Social History   Tobacco Use  . Smoking status: Never Smoker  . Smokeless tobacco: Never Used  Vaping Use  . Vaping Use: Never used  Substance Use Topics  . Alcohol use: No  . Drug use: No    Review of Systems Per HPI unless specifically indicated above     Objective:    BP 116/83   Pulse 92   Temp  97.7 F (36.5 C) (Temporal)   Resp 16   Ht 5\' 3"  (1.6 m)   Wt 167 lb (75.8 kg)   SpO2 100%   BMI 29.58 kg/m   Wt Readings from Last 3 Encounters:  04/29/20 167 lb (75.8 kg)  12/29/19 166 lb (75.3 kg)  06/19/19 169 lb 1.5 oz (76.7 kg)    Physical Exam Vitals and nursing note reviewed.  Constitutional:      General: She is not in acute distress.    Appearance: She is well-developed. She is not diaphoretic.     Comments: Well-appearing, comfortable, cooperative  HENT:     Head: Normocephalic and atraumatic.  Eyes:     General:        Right eye: No discharge.        Left eye: No discharge.     Conjunctiva/sclera: Conjunctivae normal.  Neck:     Thyroid: No thyromegaly.    Cardiovascular:     Rate and Rhythm: Normal rate and regular rhythm.     Heart sounds: Normal heart sounds. No murmur heard.   Pulmonary:     Effort: Pulmonary effort is normal. No respiratory distress.     Breath sounds: Normal breath sounds. No wheezing or rales.  Musculoskeletal:        General: Normal range of motion.     Cervical back: Normal range of motion and neck supple.  Lymphadenopathy:     Cervical: No cervical adenopathy.  Skin:    General: Skin is warm and dry.     Findings: No erythema or rash.  Neurological:     Mental Status: She is alert and oriented to person, place, and time.  Psychiatric:        Behavior: Behavior normal.     Comments: Well groomed, good eye contact, normal speech and thoughts       ECHOCARDIOGRAM REPORT       Patient Name:  KARIEL SKILLMAN Date of Exam: 06/20/2019  Medical Rec #: [id number]     Height:    63.0 in  Accession #:  [id number]     Weight:    169.1 lb  Date of Birth: 17-Apr-1996     BSA:     1.80 m  Patient Age:  24 years      BP:      108/74 mmHg  Patient Gender: F         HR:      94 bpm.  Exam Location: [facility]     Procedure: 2D Echo   Indications:   425.9/ I42.9    History:     Patient has no prior history of Echocardiogram  examinations.          Anemia, Fever.    Sonographer:   Avanell Shackleton  Referring Phys: [place]  Diagnosing Phys: Ida Rogue MD   IMPRESSIONS    1. The left ventricle has normal systolic function, with an ejection  fraction of 55-60%. The cavity size was normal. Left ventricular diastolic  parameters were normal.  2. The right ventricle has normal systolic function. The cavity was  mildly enlarged. There is no increase in right ventricular wall thickness.  Right ventricular systolic pressure is moderately elevated with an  estimated pressure of 44.2 mmHg.  3. Mitral valve regurgitation  is mild to moderate  4. Tricuspid valve regurgitation is moderate.   FINDINGS  Left Ventricle: The left ventricle has normal systolic function, with an  ejection fraction of  55-60%. The cavity size was normal. There is no  increase in left ventricular wall thickness. Left ventricular diastolic  parameters were normal.   Right Ventricle: The right ventricle has normal systolic function. The  cavity was mildly enlarged. There is no increase in right ventricular wall  thickness. Right ventricular systolic pressure is moderately elevated with  an estimated pressure of 44.2  mmHg.   Left Atrium: Left atrial size was normal in size.   Right Atrium: Right atrial size was normal in size. Right atrial pressure  is estimated at 10 mmHg.   Interatrial Septum: No atrial level shunt detected by color flow Doppler.   Pericardium: There is no evidence of pericardial effusion.   Mitral Valve: The mitral valve is normal in structure. Mitral valve  regurgitation is mild to moderate by color flow Doppler.   Tricuspid Valve: The tricuspid valve is normal in structure. Tricuspid  valve regurgitation is moderate by color flow Doppler.   Aortic Valve: The aortic valve is normal in structure. Aortic valve  regurgitation was not visualized by color flow Doppler.   Pulmonic Valve: The pulmonic valve was grossly normal. Pulmonic valve  regurgitation is not visualized by color flow Doppler.   Aorta: The aorta is normal unless otherwise noted.   Venous: The inferior vena cava measures 1.98 cm, is normal in size with  greater than 50% respiratory variability.     +--------------+--------++  LEFT VENTRICLE      +--------------+--------++  PLAX 2D          +--------------+--------++  LVIDd:    4.68 cm   +--------------+--------++  LVIDs:    3.65 cm   +--------------+--------++  LV PW:    1.11 cm   +--------------+--------++  LV IVS:    0.66 cm    +--------------+--------++  LVOT diam:  2.00 cm   +--------------+--------++  LV SV:    45 ml    +--------------+--------++  LV SV Index: 24.08    +--------------+--------++  LVOT Area:  3.14 cm  +--------------+--------++               +--------------+--------++   +---------------+----------++  RIGHT VENTRICLE       +---------------+----------++  RV S prime:  11.00 cm/s  +---------------+----------++  TAPSE (M-mode):2.0 cm    +---------------+----------++  RVSP:     44.2 mmHg   +---------------+----------++   +---------------+-------++-----------++  LEFT ATRIUM      Index     +---------------+-------++-----------++  LA diam:    4.10 cm2.28 cm/m   +---------------+-------++-----------++  LA Vol (A2C): 70.3 ml39.04 ml/m  +---------------+-------++-----------++  LA Vol (A4C): 72.2 ml40.10 ml/m  +---------------+-------++-----------++  LA Biplane Vol:73.2 ml40.66 ml/m  +---------------+-------++-----------++  +------------+----------++-----------++  RIGHT ATRIUM     Index     +------------+----------++-----------++  RA Pressure:10.00 mmHg        +------------+----------++-----------++  RA Area:  19.00 cm         +------------+----------++-----------++  RA Volume: 56.30 ml 31.27 ml/m  +------------+----------++-----------++    +-------------+-------++  AORTA          +-------------+-------++  Ao Root diam:2.00 cm  +-------------+-------++   +--------------+----------++ +---------------+-----------++  MITRAL VALVE       TRICUSPID VALVE        +--------------+----------++ +---------------+-----------++  MV Area (PHT):7.66 cm  TR Peak grad: 34.2 mmHg   +--------------+----------++ +---------------+-----------++  MV PHT:    28.71 msec TR  Vmax:    300.00 cm/s  +--------------+----------++ +---------------+-----------++  MV Decel Time:99 msec   Estimated RAP: 10.00 mmHg   +--------------+----------++ +---------------+-----------++  +-------------+-----------++  RVSP:     44.2 mmHg   MR Peak grad:123.2 mmHg  +---------------+-----------++  +-------------+-----------++  MR Mean grad:84.0 mmHg  +--------------+-------+  +-------------+-----------++ SHUNTS          MR Vmax:   555.00 cm/s +--------------+-------+  +-------------+-----------++ Systemic Diam:2.00 cm  MR Vmean:  434.0 cm/s  +--------------+-------+  +-------------+-----------++  +--------------+-----------++  MV E velocity:83.40 cm/s   +--------------+-----------++  MV A velocity:105.00 cm/s  +--------------+-----------++  MV E/A ratio: 0.79      +--------------+-----------++   +---------+-------+  IVC         +---------+-------+  IVC diam:1.98 cm  +---------+-------+     Ida Rogue MD  Electronically signed by Ida Rogue MD  Signature Date/Time: 06/20/2019/3:56:58 PM      Final      Assessment & Plan:   Problem List Items Addressed This Visit    Depression, major, recurrent, in remission ([place])    Other Visit Diagnoses    Intermittent palpitations    -  Primary   Relevant Orders   Ambulatory referral to Cardiology   Atypical chest pain       Relevant Orders   Ambulatory referral to Cardiology   Overweight (BMI 25.0-29.9)          #Palpitations, intermittent / Atypical Chest Pain / Dyspnea Chronic problem >1 year, onset during pregnancy - did not resolve postpartum Worsening frequency now increasing multiple times daily is concerning, lasting up to 1-2 hours Known trigger with caffeine, but has dramatically reduced this, still occurring Stress/anxiety is is improved or not triggering these episodes based on history today History of  depression in remission, PHQ 0 Prior cardiac work up with ECHO, see above, some moderate MR TR  Plan Referral to Cardiology for consultation given chronicity >1 year and severity of these problems now. Anticipate would pursue temporary event monitor as well. - Again discussed importance of eliminating caffeine from diet and improving lifestyle, diet exercise to help.  #Weight Discussion on weight loss today, she is at BMI 29, and has maintained weight in past 8-9 months postpartum without much weigh tloss - Advised medication is not recommended at her age and weight and would not be covered - Emphasis on diet lifestyle first. Focus on low carb / low calorie diet, may progress to keto diet if preferred, review http://vang.com/ or weight watchers - Improve regular eating schedule 3 meals a day, snacks, avoid >4-5 hours without eating   Orders Placed This Encounter  Procedures  . Ambulatory referral to Cardiology    Referral Priority:   Routine    Referral Type:   Consultation    Referral Reason:   Specialty Services Required    Requested Specialty:   Cardiology    Number of Visits Requested:   1     No orders of the defined types were placed in this encounter.     Follow up plan: Return in about 3 months (around 07/30/2020) for 3 -6 month Weight Check.   Nobie Putnam, [facility] 04/29/2020, 3:00 PM

## 2020-05-02 DIAGNOSIS — M545 Low back pain: Secondary | ICD-10-CM | POA: Diagnosis not present

## 2020-05-02 DIAGNOSIS — M6281 Muscle weakness (generalized): Secondary | ICD-10-CM | POA: Diagnosis not present

## 2020-05-06 DIAGNOSIS — M6281 Muscle weakness (generalized): Secondary | ICD-10-CM | POA: Diagnosis not present

## 2020-05-06 DIAGNOSIS — M545 Low back pain: Secondary | ICD-10-CM | POA: Diagnosis not present

## 2020-05-11 DIAGNOSIS — M6281 Muscle weakness (generalized): Secondary | ICD-10-CM | POA: Diagnosis not present

## 2020-05-11 DIAGNOSIS — M545 Low back pain: Secondary | ICD-10-CM | POA: Diagnosis not present

## 2020-05-13 DIAGNOSIS — M6281 Muscle weakness (generalized): Secondary | ICD-10-CM | POA: Diagnosis not present

## 2020-05-13 DIAGNOSIS — M545 Low back pain: Secondary | ICD-10-CM | POA: Diagnosis not present

## 2020-05-17 DIAGNOSIS — S83015A Lateral dislocation of left patella, initial encounter: Secondary | ICD-10-CM | POA: Diagnosis not present

## 2020-05-25 DIAGNOSIS — K529 Noninfective gastroenteritis and colitis, unspecified: Secondary | ICD-10-CM | POA: Diagnosis not present

## 2020-05-25 DIAGNOSIS — R509 Fever, unspecified: Secondary | ICD-10-CM | POA: Diagnosis not present

## 2020-05-25 DIAGNOSIS — Z3202 Encounter for pregnancy test, result negative: Secondary | ICD-10-CM | POA: Diagnosis not present

## 2020-05-25 DIAGNOSIS — R112 Nausea with vomiting, unspecified: Secondary | ICD-10-CM | POA: Diagnosis not present

## 2020-05-25 DIAGNOSIS — Z20822 Contact with and (suspected) exposure to covid-19: Secondary | ICD-10-CM | POA: Diagnosis not present

## 2020-05-25 DIAGNOSIS — Z03818 Encounter for observation for suspected exposure to other biological agents ruled out: Secondary | ICD-10-CM | POA: Diagnosis not present

## 2020-05-25 DIAGNOSIS — H66002 Acute suppurative otitis media without spontaneous rupture of ear drum, left ear: Secondary | ICD-10-CM | POA: Diagnosis not present

## 2020-05-25 DIAGNOSIS — J019 Acute sinusitis, unspecified: Secondary | ICD-10-CM | POA: Diagnosis not present

## 2020-05-26 DIAGNOSIS — S83015D Lateral dislocation of left patella, subsequent encounter: Secondary | ICD-10-CM | POA: Diagnosis not present

## 2020-06-03 ENCOUNTER — Ambulatory Visit (INDEPENDENT_AMBULATORY_CARE_PROVIDER_SITE_OTHER): Payer: BC Managed Care – PPO | Admitting: Cardiology

## 2020-06-03 ENCOUNTER — Encounter: Payer: Self-pay | Admitting: Cardiology

## 2020-06-03 ENCOUNTER — Other Ambulatory Visit: Payer: Self-pay

## 2020-06-03 ENCOUNTER — Ambulatory Visit (INDEPENDENT_AMBULATORY_CARE_PROVIDER_SITE_OTHER): Payer: BC Managed Care – PPO

## 2020-06-03 VITALS — BP 100/80 | HR 110 | Ht 63.0 in | Wt 161.2 lb

## 2020-06-03 DIAGNOSIS — R002 Palpitations: Secondary | ICD-10-CM

## 2020-06-03 NOTE — Progress Notes (Signed)
Cardiology Office Note:    Date:  06/03/2020   ID:  Patricia Pearson, DOB Aug 22, 1996, MRN [id number]  PCP:  Olin Hauser, DO  [place] Cardiologist:  Kate Sable, MD  [place] Electrophysiologist:  None   Referring MD: Nobie Putnam *   Chief Complaint  Patient presents with  . New Patient (Initial Visit)    Intermittent palpitations and chest pain; Meds verbally reviewed with patient.   Patricia Pearson is a 24 y.o. female who is being seen today for the evaluation of palpitations at the request of Nobie Putnam *.   History of Present Illness:    Patricia Pearson is a 24 y.o. female with a hx of anxiety, who presents due to palpitations.  Patient states having symptoms of palpitations for about a year now.  Palpitations first of heart after giving birth to her child a year ago.  She complains of having fast heart rates, but subsequently become irregular and then normal.  Symptoms are associated with shortness of breath and sometimes dizziness.  Symptoms did subside for some time but have returned over the past several weeks, with increased frequency, occurring every day the past week.  Symptoms of increased heart rate can last anywhere from 30 minutes to an hour.  She has a smart watch and has noticed heart rates as high as 160s bpm.  She denies any history of heart disease.  Denies taking any caffeinated products.  Past Medical History:  Diagnosis Date  . Anemia   . Anxiety   . Kidney stone     Past Surgical History:  Procedure Laterality Date  . STENT PLACEMENT RT URETER ([place] HX) Right 04/09/2013  . WISDOM TOOTH EXTRACTION  2016    Current Medications: Current Meds  Medication Sig  . cefdinir (OMNICEF) 300 MG capsule Take 300 mg by mouth in the morning and at bedtime.  Marland Kitchen etonogestrel (NEXPLANON) 68 MG IMPL implant 1 each by Subdermal route once.  . ferrous sulfate 325 (65 FE) MG EC tablet Take 325 mg by mouth daily.    Marland Kitchen ibuprofen (ADVIL) 600 MG tablet Take 1 tablet (600 mg total) by mouth every 6 (six) hours as needed for headache, mild pain, moderate pain or cramping.  Marland Kitchen ketoconazole (NIZORAL) 2 % shampoo SHAMPOO SCALP AND FACE (LEAVE ON FOR FIVE MINUTES, THEN RINSE) 2  3 X WEEKLY  . spironolactone (ALDACTONE) 50 MG tablet Take 50 mg by mouth daily. Take 2 tablets total of 100 mg.  . tretinoin (RETIN-A) 0.025 % cream APPLY PEA SIZED AMOUNT EVERY OTHER NIGHT AND BUILD UP TO NIGHTLY AS CAN TOLERATE     Allergies:   Patient has no known allergies.   Social History   Socioeconomic History  . Marital status: Single    Spouse name: Not on file  . Number of children: Not on file  . Years of education: College  . Highest education level: Not on file  Occupational History  . 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 Smoking status: Never Smoker  . Smokeless tobacco: Never Used  Vaping Use  . Vaping Use: Never used  Substance and Sexual Activity  . Alcohol use: No  . Drug use: No  . Sexual activity: Yes    Birth control/protection: Pill  Other Topics Concern  . Not on file  Social History Narrative   Pt. Lives with her boyfriend Teaching laboratory technician [phone])  [phone]   Social Determinants of Health   Financial Resource Strain:   . Difficulty of Paying Living Expenses:   Food Insecurity:   . Worried About Charity fundraiser in the Last Year:   . Arboriculturist in the Last Year:   Transportation Needs:   . Film/video editor (Medical):   Marland Kitchen Lack of Transportation (Non-Medical):   Physical Activity:   . Days of Exercise per Week:   . Minutes of Exercise per Session:   Stress:   . Feeling of Stress :   Social Connections:   . Frequency of Communication with Friends and Family:   . Frequency of Social Gatherings with Friends and Family:   . Attends Religious Services:   . Active Member of Clubs or  Organizations:   . Attends Archivist Meetings:   Marland Kitchen Marital Status:      Family History: The patient's family history includes Anxiety disorder in her mother; Basal cell carcinoma in her mother; Drug abuse in her father; Heart failure in her maternal grandfather; Lung cancer in her paternal grandmother; Peptic Ulcer in her father. There is no history of Colon cancer, Cervical cancer, or Breast cancer.  ROS:   Please see the history of present illness.     All other systems reviewed and are negative.  EKGs/Labs/Other Studies Reviewed:    The following studies were reviewed today:   EKG:  EKG is  ordered today.  The ekg ordered today demonstrates sinus tachycardia, heart rate 110 otherwise normal ECG  Recent Labs: 06/15/2019: TSH 1.738 06/21/2019: Magnesium 1.4 06/22/2019: ALT 24; BUN 10; Creatinine, Ser 0.55; Potassium 3.5; Sodium 136 06/23/2019: Hemoglobin 10.0; Platelets 221  Recent Lipid Panel No results found for: CHOL, TRIG, HDL, CHOLHDL, VLDL, LDLCALC, LDLDIRECT  Physical Exam:    VS:  BP 100/80 (BP Location: Right Arm, Patient Position: Sitting, Cuff Size: Normal)   Pulse (!) 110   Ht 5\' 3"  (1.6 m)   Wt 161 lb 4 oz (73.1 kg)   SpO2 98%   BMI 28.56 kg/m     Wt Readings from Last 3 Encounters:  06/03/20 161 lb 4 oz (73.1 kg)  04/29/20 167 lb (75.8 kg)  12/29/19 166 lb (75.3 kg)     GEN:  Well nourished, well developed in no acute distress HEENT: Normal NECK: No JVD; No carotid bruits LYMPHATICS: No lymphadenopathy CARDIAC: RRR, no murmurs, rubs, gallops RESPIRATORY:  Clear to auscultation without rales, wheezing or rhonchi  ABDOMEN: Soft, non-tender, non-distended MUSCULOSKELETAL:  No edema; left knee in brace noted SKIN: Warm and dry NEUROLOGIC:  Alert and oriented x 3 PSYCHIATRIC:  Normal affect   ASSESSMENT:    1. Palpitations    PLAN:    In order of problems listed above:  1. Patient with history of palpitations, EKG revealing sinus  tachycardia.  Differential diagnosis include atrial arrhythmias with highest being underlying SVT or inappropriate sinus tachycardia.  Will evaluate patient with a cardiac monitor x2 weeks.  Follow-up after cardiac monitor.   Medication Adjustments/Labs and Tests Ordered: Current medicines are reviewed at length with the patient today.  Concerns regarding medicines are outlined above.  Orders Placed This Encounter  Procedures  . LONG TERM MONITOR (3-14 DAYS)  . EKG 12-Lead   No orders of the defined types were placed in this encounter.   Patient Instructions  Medication Instructions:  Your physician recommends that you continue on your current medications as directed. Please refer to the Current Medication  list given to you today.  *If you need a refill on your cardiac medications before your next appointment, please call your pharmacy*   Lab Work: None Ordered If you have labs (blood work) drawn today and your tests are completely normal, you will receive your results only by: Marland Kitchen MyChart Message (if you have MyChart) OR . A paper copy in the mail If you have any lab test that is abnormal or we need to change your treatment, we will call you to review the results.   Testing/Procedures:  Your physician has recommended that you wear a Zio monitor. This monitor is a medical device that records the heart's electrical activity. Doctors most often use these monitors to diagnose arrhythmias. Arrhythmias are problems with the speed or rhythm of the heartbeat. The monitor is a small device applied to your chest. You can wear one while you do your normal daily activities. While wearing this monitor if you have any symptoms to push the button and record what you felt. Once you have worn this monitor for the period of time provider prescribed (Usually 14 days), you will return the monitor device in the postage paid box. Once it is returned they will download the data collected and provide [place] with  a report which the provider will then review and we will call you with those results. Important tips:  1. Avoid showering during the first 24 hours of wearing the monitor. 2. Avoid excessive sweating to help maximize wear time. 3. Do not submerge the device, no hot tubs, and no swimming pools. 4. Keep any lotions or oils away from the patch. 5. After 24 hours you may shower with the patch on. Take brief showers with your back facing the shower head.  6. Do not remove patch once it has been placed because that will interrupt data and decrease adhesive wear time. 7. Push the button when you have any symptoms and write down what you were feeling. 8. Once you have completed wearing your monitor, remove and place into box which has postage paid and place in your outgoing mailbox.  9. If for some reason you have misplaced your box then call our office and we can provide another box and/or mail it off for you.         Follow-Up: At [facility], you and your health needs are our priority.  As part of our continuing mission to provide you with exceptional heart care, we have created designated Provider Care Teams.  These Care Teams include your primary Cardiologist (physician) and Advanced Practice Providers (APPs -  Physician Assistants and Nurse Practitioners) who all work together to provide you with the care you need, when you need it.  We recommend signing up for the patient portal called "MyChart".  Sign up information is provided on this After Visit Summary.  MyChart is used to connect with patients for Virtual Visits (Telemedicine).  Patients are able to view lab/test results, encounter notes, upcoming appointments, etc.  Non-urgent messages can be sent to your provider as well.   To learn more about what you can do with MyChart, go to [email or web address].    Your next appointment:   5 week(s)  The format for your next appointment:   In Person  Provider:   Kate Sable,  MD   Other Instructions      Signed, Kate Sable, MD  06/03/2020 5:07 PM    [place]

## 2020-06-03 NOTE — Patient Instructions (Signed)
Medication Instructions:  Your physician recommends that you continue on your current medications as directed. Please refer to the Current Medication list given to you today.  *If you need a refill on your cardiac medications before your next appointment, please call your pharmacy*   Lab Work: None Ordered If you have labs (blood work) drawn today and your tests are completely normal, you will receive your results only by: . MyChart Message (if you have MyChart) OR . A paper copy in the mail If you have any lab test that is abnormal or we need to change your treatment, we will call you to review the results.   Testing/Procedures:  Your physician has recommended that you wear a Zio monitor. This monitor is a medical device that records the heart's electrical activity. Doctors most often use these monitors to diagnose arrhythmias. Arrhythmias are problems with the speed or rhythm of the heartbeat. The monitor is a small device applied to your chest. You can wear one while you do your normal daily activities. While wearing this monitor if you have any symptoms to push the button and record what you felt. Once you have worn this monitor for the period of time provider prescribed (Usually 14 days), you will return the monitor device in the postage paid box. Once it is returned they will download the data collected and provide us with a report which the provider will then review and we will call you with those results. Important tips:  1. Avoid showering during the first 24 hours of wearing the monitor. 2. Avoid excessive sweating to help maximize wear time. 3. Do not submerge the device, no hot tubs, and no swimming pools. 4. Keep any lotions or oils away from the patch. 5. After 24 hours you may shower with the patch on. Take brief showers with your back facing the shower head.  6. Do not remove patch once it has been placed because that will interrupt data and decrease adhesive wear  time. 7. Push the button when you have any symptoms and write down what you were feeling. 8. Once you have completed wearing your monitor, remove and place into box which has postage paid and place in your outgoing mailbox.  9. If for some reason you have misplaced your box then call our office and we can provide another box and/or mail it off for you.        Follow-Up: At [facility], you and your health needs are our priority.  As part of our continuing mission to provide you with exceptional heart care, we have created designated Provider Care Teams.  These Care Teams include your primary Cardiologist (physician) and Advanced Practice Providers (APPs -  Physician Assistants and Nurse Practitioners) who all work together to provide you with the care you need, when you need it.  We recommend signing up for the patient portal called "MyChart".  Sign up information is provided on this After Visit Summary.  MyChart is used to connect with patients for Virtual Visits (Telemedicine).  Patients are able to view lab/test results, encounter notes, upcoming appointments, etc.  Non-urgent messages can be sent to your provider as well.   To learn more about what you can do with MyChart, go to [email or web address].    Your next appointment:   5 week(s)  The format for your next appointment:   In Person  Provider:   Brian Agbor-Etang, MD   Other Instructions  

## 2020-06-10 ENCOUNTER — Telehealth: Payer: 59 | Admitting: Family Medicine

## 2020-06-17 DIAGNOSIS — J019 Acute sinusitis, unspecified: Secondary | ICD-10-CM | POA: Diagnosis not present

## 2020-06-17 DIAGNOSIS — B9689 Other specified bacterial agents as the cause of diseases classified elsewhere: Secondary | ICD-10-CM | POA: Diagnosis not present

## 2020-06-17 DIAGNOSIS — H66001 Acute suppurative otitis media without spontaneous rupture of ear drum, right ear: Secondary | ICD-10-CM | POA: Diagnosis not present

## 2020-06-20 DIAGNOSIS — M2202 Recurrent dislocation of patella, left knee: Secondary | ICD-10-CM | POA: Diagnosis not present

## 2020-06-20 DIAGNOSIS — S83015D Lateral dislocation of left patella, subsequent encounter: Secondary | ICD-10-CM | POA: Diagnosis not present

## 2020-06-21 ENCOUNTER — Encounter: Payer: Self-pay | Admitting: Emergency Medicine

## 2020-06-21 ENCOUNTER — Other Ambulatory Visit: Payer: Self-pay

## 2020-06-21 ENCOUNTER — Emergency Department: Payer: BC Managed Care – PPO

## 2020-06-21 DIAGNOSIS — R000 Tachycardia, unspecified: Secondary | ICD-10-CM | POA: Diagnosis not present

## 2020-06-21 DIAGNOSIS — R079 Chest pain, unspecified: Secondary | ICD-10-CM | POA: Diagnosis not present

## 2020-06-21 DIAGNOSIS — Z5321 Procedure and treatment not carried out due to patient leaving prior to being seen by health care provider: Secondary | ICD-10-CM | POA: Insufficient documentation

## 2020-06-21 LAB — URINALYSIS, COMPLETE (UACMP) WITH MICROSCOPIC
Bacteria, UA: NONE SEEN
Bilirubin Urine: NEGATIVE
Glucose, UA: NEGATIVE mg/dL
Hgb urine dipstick: NEGATIVE
Ketones, ur: NEGATIVE mg/dL
Leukocytes,Ua: NEGATIVE
Nitrite: NEGATIVE
Protein, ur: NEGATIVE mg/dL
Specific Gravity, Urine: 1.016 (ref 1.005–1.030)
pH: 6 (ref 5.0–8.0)

## 2020-06-21 LAB — CBC WITH DIFFERENTIAL/PLATELET
Abs Immature Granulocytes: 0.15 10*3/uL — ABNORMAL HIGH (ref 0.00–0.07)
Basophils Absolute: 0 10*3/uL (ref 0.0–0.1)
Basophils Relative: 0 %
Eosinophils Absolute: 0 10*3/uL (ref 0.0–0.5)
Eosinophils Relative: 0 %
HCT: 40.2 % (ref 36.0–46.0)
Hemoglobin: 14 g/dL (ref 12.0–15.0)
Immature Granulocytes: 1 %
Lymphocytes Relative: 14 %
Lymphs Abs: 2.4 10*3/uL (ref 0.7–4.0)
MCH: 30.5 pg (ref 26.0–34.0)
MCHC: 34.8 g/dL (ref 30.0–36.0)
MCV: 87.6 fL (ref 80.0–100.0)
Monocytes Absolute: 1.3 10*3/uL — ABNORMAL HIGH (ref 0.1–1.0)
Monocytes Relative: 7 %
Neutro Abs: 13.8 10*3/uL — ABNORMAL HIGH (ref 1.7–7.7)
Neutrophils Relative %: 78 %
Platelets: 249 10*3/uL (ref 150–400)
RBC: 4.59 MIL/uL (ref 3.87–5.11)
RDW: 12 % (ref 11.5–15.5)
WBC: 17.7 10*3/uL — ABNORMAL HIGH (ref 4.0–10.5)
nRBC: 0 % (ref 0.0–0.2)

## 2020-06-21 LAB — TROPONIN I (HIGH SENSITIVITY): Troponin I (High Sensitivity): <2 ng/L (ref <18)

## 2020-06-21 LAB — COMPREHENSIVE METABOLIC PANEL
ALT: 14 U/L (ref 0–44)
AST: 14 U/L — ABNORMAL LOW (ref 15–41)
Albumin: 4.5 g/dL (ref 3.5–5.0)
Alkaline Phosphatase: 67 U/L (ref 38–126)
Anion gap: 10 (ref 5–15)
BUN: 13 mg/dL (ref 6–20)
CO2: 24 mmol/L (ref 22–32)
Calcium: 9.4 mg/dL (ref 8.9–10.3)
Chloride: 101 mmol/L (ref 98–111)
Creatinine, Ser: 0.78 mg/dL (ref 0.44–1.00)
GFR calc Af Amer: >60 mL/min (ref >60)
GFR calc non Af Amer: >60 mL/min (ref >60)
Glucose, Bld: 134 mg/dL — ABNORMAL HIGH (ref 70–99)
Potassium: 3.9 mmol/L (ref 3.5–5.1)
Sodium: 135 mmol/L (ref 135–145)
Total Bilirubin: 0.6 mg/dL (ref 0.3–1.2)
Total Protein: 8.1 g/dL (ref 6.5–8.1)

## 2020-06-21 LAB — POCT PREGNANCY, URINE: Preg Test, Ur: NEGATIVE

## 2020-06-21 NOTE — ED Triage Notes (Signed)
Pt presents to ED with tachycardia for the past 2+ hours; approx 1-1 1/2 hours after taking prednisone. Was dx with sinus tachycardia in 2019 after delivering her baby. Her tachycardia episodes have recently been occurring more often so she was seen by cardiology and was given a Holter monitor which was returned Friday for review. currently taking antibiotic and prednisone since Friday for an ear infection that has been resistant to antibiotics for the past 3 weeks. Pt states this time she has chest pain that goes through to her back which is unusual for her. Pain radiates down her arms as well.

## 2020-06-22 ENCOUNTER — Ambulatory Visit (INDEPENDENT_AMBULATORY_CARE_PROVIDER_SITE_OTHER): Payer: 59 | Admitting: Family Medicine

## 2020-06-22 ENCOUNTER — Other Ambulatory Visit: Payer: Self-pay

## 2020-06-22 ENCOUNTER — Encounter: Payer: Self-pay | Admitting: Family Medicine

## 2020-06-22 ENCOUNTER — Emergency Department
Admission: EM | Admit: 2020-06-22 | Discharge: 2020-06-22 | Disposition: A | Discharge status: Home / Self Care | Payer: BC Managed Care – PPO | Attending: Emergency Medicine | Admitting: Emergency Medicine

## 2020-06-22 ENCOUNTER — Telehealth: Payer: Self-pay | Admitting: Cardiology

## 2020-06-22 VITALS — BP 110/75 | HR 121 | Temp 97.5°F | Resp 16 | Ht 63.0 in | Wt 160.0 lb

## 2020-06-22 DIAGNOSIS — R0602 Shortness of breath: Secondary | ICD-10-CM | POA: Diagnosis not present

## 2020-06-22 DIAGNOSIS — R079 Chest pain, unspecified: Secondary | ICD-10-CM | POA: Diagnosis not present

## 2020-06-22 DIAGNOSIS — R42 Dizziness and giddiness: Secondary | ICD-10-CM | POA: Diagnosis not present

## 2020-06-22 DIAGNOSIS — R000 Tachycardia, unspecified: Secondary | ICD-10-CM | POA: Diagnosis not present

## 2020-06-22 DIAGNOSIS — R591 Generalized enlarged lymph nodes: Secondary | ICD-10-CM | POA: Diagnosis not present

## 2020-06-22 DIAGNOSIS — Z114 Encounter for screening for human immunodeficiency virus [HIV]: Secondary | ICD-10-CM | POA: Diagnosis not present

## 2020-06-22 DIAGNOSIS — Z3202 Encounter for pregnancy test, result negative: Secondary | ICD-10-CM | POA: Diagnosis not present

## 2020-06-22 DIAGNOSIS — J019 Acute sinusitis, unspecified: Secondary | ICD-10-CM | POA: Diagnosis not present

## 2020-06-22 DIAGNOSIS — R590 Localized enlarged lymph nodes: Secondary | ICD-10-CM | POA: Diagnosis not present

## 2020-06-22 DIAGNOSIS — R002 Palpitations: Secondary | ICD-10-CM

## 2020-06-22 DIAGNOSIS — B9689 Other specified bacterial agents as the cause of diseases classified elsewhere: Secondary | ICD-10-CM | POA: Diagnosis not present

## 2020-06-22 DIAGNOSIS — F418 Other specified anxiety disorders: Secondary | ICD-10-CM | POA: Diagnosis not present

## 2020-06-22 DIAGNOSIS — Z79899 Other long term (current) drug therapy: Secondary | ICD-10-CM | POA: Diagnosis not present

## 2020-06-22 DIAGNOSIS — R0789 Other chest pain: Secondary | ICD-10-CM | POA: Diagnosis not present

## 2020-06-22 DIAGNOSIS — H66003 Acute suppurative otitis media without spontaneous rupture of ear drum, bilateral: Secondary | ICD-10-CM | POA: Diagnosis not present

## 2020-06-22 LAB — TROPONIN I (HIGH SENSITIVITY): Troponin I (High Sensitivity): <2 ng/L (ref <18)

## 2020-06-22 MED ORDER — METOPROLOL SUCCINATE ER 25 MG PO TB24: 12.5000 mg | ORAL_TABLET | Freq: Every day | ORAL | 2 refills | Status: DC

## 2020-06-22 NOTE — Telephone Encounter (Signed)
Dr. Parks Ranger office calling in to speak with provider covering for Dr.Agbor due to his patient having palpitations. Patient is currently in office now  A specific question he has regards a palpitation medication, CMA does not know the specific medication   Please advise by calling [phone]

## 2020-06-22 NOTE — Telephone Encounter (Signed)
Patient has already scheduled for today at 2pm.  She was at ED and Left without being seen today.  Nobie Putnam, [facility] 06/22/2020, 1:17 PM

## 2020-06-22 NOTE — Telephone Encounter (Signed)
Returned phone call to [employer] She informed me that Dr. Donald Pore has been in touch with Dr. Saunders Revel (DOD).

## 2020-06-22 NOTE — Progress Notes (Signed)
Subjective:    Patient ID: Patricia Pearson, female    DOB: 23-Apr-1996, 24 y.o.   MRN: [id number]  Patricia Pearson is a 24 y.o. female presenting on 06/22/2020 for Ear Pain (low grade fever--sinusitis --3 weeks ago Covid test was negative--as per patient neck is sore and with ear pain feels dizzy), Palpitations, and Hypertension  Patient presents for a same day appointment.  HPI    ED FOLLOW-UP VISIT  Hospital/Location: [facility] Date of ED Visit: 06/22/20  Reason for Presenting to ED: tachycardia chest pain Primary (+Secondary) Diagnosis: None - left without being seen  FOLLOW-UP  - ED testing viewed, patient had labs and repeat troponin and chest x-ray Upreg negative, and reported to have waited 10 hours in ED without being seen due to high volume. She left and presented to our office today for evaluation. - She has been to [facility] on 06/22/20 as well for acute ear infection bilateral, she was treated with Augmentin but has not started this yet.  Important recent history, established with [facility] Cardiology Dr Garen Lah on 06/03/20, had evaluation and ZIO patch 14 day monitor, now we are waiting on results had been sent in 1 week ago. Her next apt is scheduled for 07/08/20. They diagnosed her with a type of sinus tachycardia at that time but waiting on ZIO monitor for any further determination of arrhythmia, they said would likely treat her tachycardia after results.  - Today she expresses significant amount of anxiety and fear about what could be wrong with her and her heart and concern for her symptoms. She asks to follow up on the testing done in ED since she could not wait longer. - She has not notified her cardiologist - Not taken any other medications for this problem. Has yet to start the Augmentin for ears, she thinks the ear infection may have triggered this - She was also given Prednisone for ear infection and said some worsening issues started 1 hour after taking  that, with tachycardia her measurement was HR >120-170 and chest pain associated. - For ears, she said started L then R then now both with pain. She has already called to ask about ENT and was told they would refer her if not improved   Depression screen [facility] 2/9 04/29/2020 12/07/2019 09/23/2019  Decreased Interest 0 0 0  Down, Depressed, Hopeless 0 0 0  PHQ - 2 Score 0 0 0  Altered sleeping 0 - 0  Tired, decreased energy 0 - 0  Change in appetite 0 - 0  Feeling bad or failure about yourself  0 - 0  Trouble concentrating 0 - 0  Moving slowly or fidgety/restless 0 - 0  Suicidal thoughts 0 - 0  PHQ-9 Score 0 - 0  Difficult doing work/chores Not difficult at all - Not difficult at all    Social History   Tobacco Use  . Smoking status: Never Smoker  . Smokeless tobacco: Never Used  Vaping Use  . Vaping Use: Never used  Substance Use Topics  . Alcohol use: No  . Drug use: No    Review of Systems Per HPI unless specifically indicated above     Objective:    BP 110/75   Pulse (!) 121   Temp (!) 97.5 F (36.4 C) (Temporal)   Resp 16   Ht 5\' 3"  (1.6 m)   Wt 160 lb (72.6 kg)   SpO2 100%   BMI 28.34 kg/m   Wt Readings from Last  3 Encounters:  06/22/20 160 lb (72.6 kg)  06/21/20 165 lb (74.8 kg)  06/03/20 161 lb 4 oz (73.1 kg)    Physical Exam Vitals and nursing note reviewed.  Constitutional:      General: She is not in acute distress.    Appearance: She is well-developed. She is not diaphoretic.     Comments: Well-appearing, comfortable, cooperative  HENT:     Head: Normocephalic and atraumatic.  Eyes:     General:        Right eye: No discharge.        Left eye: No discharge.     Conjunctiva/sclera: Conjunctivae normal.  Neck:     Thyroid: No thyromegaly.  Cardiovascular:     Rate and Rhythm: Regular rhythm. Tachycardia present.     Heart sounds: Normal heart sounds. No murmur heard.   Pulmonary:     Effort: Pulmonary effort is normal. No respiratory  distress.     Breath sounds: Normal breath sounds. No wheezing or rales.  Musculoskeletal:        General: Normal range of motion.     Cervical back: Normal range of motion and neck supple.  Lymphadenopathy:     Cervical: No cervical adenopathy.  Skin:    General: Skin is warm and dry.     Findings: No erythema or rash.  Neurological:     Mental Status: She is alert and oriented to person, place, and time.  Psychiatric:        Behavior: Behavior normal.     Comments: Well groomed, good eye contact, normal speech and thoughts - anxious today     I have personally reviewed the radiology report from 06/21/20 CXR.  DG Chest 2 ViewPerformed 06/21/2020 Final result  Study Result CLINICAL DATA: Chest pain for 2 hours after taking prednisone  EXAM: CHEST - 2 VIEW  COMPARISON: Radiograph 06/20/2019, CT 06/19/2019  FINDINGS: No consolidation, features of edema, pneumothorax, or effusion. Pulmonary vascularity is normally distributed. The cardiomediastinal contours are unremarkable. No acute osseous or soft tissue abnormality.  IMPRESSION: No acute cardiopulmonary abnormality.   Electronically Signed By: Lovena Le M.D. On: 06/21/2020 23:19   Results for orders placed or performed during the hospital encounter of 06/22/20  CBC with Differential  Result Value Ref Range   WBC 17.7 (H) 4.0 - 10.5 K/uL   RBC 4.59 3.87 - 5.11 MIL/uL   Hemoglobin 14.0 12.0 - 15.0 g/dL   HCT 40.2 36 - 46 %   MCV 87.6 80.0 - 100.0 fL   MCH 30.5 26.0 - 34.0 pg   MCHC 34.8 30.0 - 36.0 g/dL   RDW 12.0 11.5 - 15.5 %   Platelets 249 150 - 400 K/uL   nRBC 0.0 0.0 - 0.2 %   Neutrophils Relative % 78 %   Neutro Abs 13.8 (H) 1.7 - 7.7 K/uL   Lymphocytes Relative 14 %   Lymphs Abs 2.4 0.7 - 4.0 K/uL   Monocytes Relative 7 %   Monocytes Absolute 1.3 (H) 0 - 1 K/uL   Eosinophils Relative 0 %   Eosinophils Absolute 0.0 0 - 0 K/uL   Basophils Relative 0 %   Basophils Absolute 0.0 0 - 0 K/uL    Immature Granulocytes 1 %   Abs Immature Granulocytes 0.15 (H) 0.00 - 0.07 K/uL  Comprehensive metabolic panel  Result Value Ref Range   Sodium 135 135 - 145 mmol/L   Potassium 3.9 3.5 - 5.1 mmol/L   Chloride 101 98 -  111 mmol/L   CO2 24 22 - 32 mmol/L   Glucose, Bld 134 (H) 70 - 99 mg/dL   BUN 13 6 - 20 mg/dL   Creatinine, Ser 0.78 0.44 - 1.00 mg/dL   Calcium 9.4 8.9 - 10.3 mg/dL   Total Protein 8.1 6.5 - 8.1 g/dL   Albumin 4.5 3.5 - 5.0 g/dL   AST 14 (L) 15 - 41 U/L   ALT 14 0 - 44 U/L   Alkaline Phosphatase 67 38 - 126 U/L   Total Bilirubin 0.6 0.3 - 1.2 mg/dL   GFR calc non Af Amer >60 >60 mL/min   GFR calc Af Amer >60 >60 mL/min   Anion gap 10 5 - 15  Urinalysis, Complete w Microscopic  Result Value Ref Range   Color, Urine YELLOW (A) YELLOW   APPearance CLEAR (A) CLEAR   Specific Gravity, Urine 1.016 1.005 - 1.030   pH 6.0 5.0 - 8.0   Glucose, UA NEGATIVE NEGATIVE mg/dL   Hgb urine dipstick NEGATIVE NEGATIVE   Bilirubin Urine NEGATIVE NEGATIVE   Ketones, ur NEGATIVE NEGATIVE mg/dL   Protein, ur NEGATIVE NEGATIVE mg/dL   Nitrite NEGATIVE NEGATIVE   Leukocytes,Ua NEGATIVE NEGATIVE   RBC / HPF 0-5 0 - 5 RBC/hpf   WBC, UA 0-5 0 - 5 WBC/hpf   Bacteria, UA NONE SEEN NONE SEEN   Squamous Epithelial / LPF 0-5 0 - 5   Mucus PRESENT   Pregnancy, urine POC  Result Value Ref Range   Preg Test, Ur NEGATIVE NEGATIVE  Troponin I (High Sensitivity)  Result Value Ref Range   Troponin I (High Sensitivity) <2 <18 ng/L  Troponin I (High Sensitivity)  Result Value Ref Range   Troponin I (High Sensitivity) <2 <18 ng/L      Assessment & Plan:   Problem List Items Addressed This Visit    None    Visit Diagnoses    Inappropriate sinus tachycardia    -  Primary   Relevant Medications   metoprolol succinate (TOPROL XL) 25 MG 24 hr tablet   Intermittent palpitations       Relevant Medications   metoprolol succinate (TOPROL XL) 25 MG 24 hr tablet      Clinically with  inappropriate sinus tachycardia, in setting of acute AOM diagnosed recently Associated concerning features with chest pain and constellation of symptoms can be related to elevated HR and also fueled by both her anxiety (which is recently worse often associated with her health) and may be some caffeine component / can be flared by prednisone as well  Known history of intermittent palpitations being worked up by Cardiology, already completed ZIO now awaiting result  Hemodynamically stable here in office. After leaving ED without being seen, we reviewed her lab results including negative delta troponin and initial work up there.  I have called [facility] Cardiology and discussed the case with on call provider Dr Harrell Gave End, we agreed to start Metoprolol XL low dose of 12.5mg  daily (half of 25mg  tab) to control tachycardia and symptoms for now, if inadequate response can take whole pill 25mg  daily, can monitor her BP as well if tolerated.  She will return to Cardiology as scheduled 07/08/20. And follow up with them sooner if new concerns.   Meds ordered this encounter  Medications  . metoprolol succinate (TOPROL XL) 25 MG 24 hr tablet    Sig: Take 0.5 tablets (12.5 mg total) by mouth daily. May increase to 1 whole tablet (25mg ) daily in  future if not improved.    Dispense:  30 tablet    Refill:  2      Follow up plan: Return in about 2 weeks (around 07/06/2020), or if symptoms worsen or fail to improve, for tachycardia.    Nobie Putnam, [facility] 06/22/2020, 2:04 PM

## 2020-06-22 NOTE — ED Notes (Signed)
Pt called to be roomed with no answer.

## 2020-06-22 NOTE — ED Triage Notes (Signed)
Pt called from [facility] to treatment room, no response 

## 2020-06-23 DIAGNOSIS — R0789 Other chest pain: Secondary | ICD-10-CM | POA: Diagnosis not present

## 2020-06-24 ENCOUNTER — Encounter: Payer: Self-pay | Admitting: Family Medicine

## 2020-06-24 ENCOUNTER — Other Ambulatory Visit: Payer: Self-pay

## 2020-06-24 ENCOUNTER — Ambulatory Visit (INDEPENDENT_AMBULATORY_CARE_PROVIDER_SITE_OTHER): Payer: 59 | Admitting: Family Medicine

## 2020-06-24 VITALS — BP 112/77 | HR 99 | Temp 97.5°F | Resp 17 | Ht 63.0 in | Wt 160.0 lb

## 2020-06-24 DIAGNOSIS — R1084 Generalized abdominal pain: Secondary | ICD-10-CM | POA: Diagnosis not present

## 2020-06-24 DIAGNOSIS — R197 Diarrhea, unspecified: Secondary | ICD-10-CM | POA: Diagnosis not present

## 2020-06-24 DIAGNOSIS — R002 Palpitations: Secondary | ICD-10-CM

## 2020-06-24 DIAGNOSIS — K921 Melena: Secondary | ICD-10-CM | POA: Diagnosis not present

## 2020-06-24 DIAGNOSIS — R109 Unspecified abdominal pain: Secondary | ICD-10-CM | POA: Insufficient documentation

## 2020-06-24 DIAGNOSIS — R110 Nausea: Secondary | ICD-10-CM | POA: Diagnosis not present

## 2020-06-24 LAB — CBC WITH DIFFERENTIAL/PLATELET
Absolute Monocytes: 616 cells/uL (ref 200–950)
Basophils Absolute: 47 cells/uL (ref 0–200)
Basophils Relative: 0.6 %
Eosinophils Absolute: 111 cells/uL (ref 15–500)
Eosinophils Relative: 1.4 %
HCT: 41.3 % (ref 35.0–45.0)
Hemoglobin: 13.7 g/dL (ref 11.7–15.5)
Lymphs Abs: 1967 cells/uL (ref 850–3900)
MCH: 29.8 pg (ref 27.0–33.0)
MCHC: 33.2 g/dL (ref 32.0–36.0)
MCV: 89.8 fL (ref 80.0–100.0)
MPV: 11.4 fL (ref 7.5–12.5)
Monocytes Relative: 7.8 %
Neutro Abs: 5159 cells/uL (ref 1500–7800)
Neutrophils Relative %: 65.3 %
Platelets: 219 10*3/uL (ref 140–400)
RBC: 4.6 10*6/uL (ref 3.80–5.10)
RDW: 12.4 % (ref 11.0–15.0)
Total Lymphocyte: 24.9 %
WBC: 7.9 10*3/uL (ref 3.8–10.8)

## 2020-06-24 LAB — COMPLETE METABOLIC PANEL WITH GFR
AG Ratio: 1.5 (calc) (ref 1.0–2.5)
ALT: 14 U/L (ref 6–29)
AST: 11 U/L (ref 10–30)
Albumin: 4.3 g/dL (ref 3.6–5.1)
Alkaline phosphatase (APISO): 67 U/L (ref 31–125)
BUN: 10 mg/dL (ref 7–25)
CO2: 30 mmol/L (ref 20–32)
Calcium: 8.8 mg/dL (ref 8.6–10.2)
Chloride: 101 mmol/L (ref 98–110)
Creat: 0.81 mg/dL (ref 0.50–1.10)
GFR, Est African American: 120 mL/min/{1.73_m2} (ref >=60)
GFR, Est Non African American: 104 mL/min/{1.73_m2} (ref >=60)
Globulin: 2.8 g/dL (calc) (ref 1.9–3.7)
Glucose, Bld: 80 mg/dL (ref 65–99)
Potassium: 3.9 mmol/L (ref 3.5–5.3)
Sodium: 136 mmol/L (ref 135–146)
Total Bilirubin: 0.6 mg/dL (ref 0.2–1.2)
Total Protein: 7.1 g/dL (ref 6.1–8.1)

## 2020-06-24 LAB — POC HEMOCCULT BLD/STL (OFFICE/1-CARD/DIAGNOSTIC): Fecal Occult Blood, POC: NEGATIVE

## 2020-06-24 NOTE — Assessment & Plan Note (Addendum)
Abdominal pain x 3 days with nausea, diarrhea, bloody stools, palpitations, dizziness, hand and foot numbness.  Has recently completed antibiotics and prednisone, which can lead to GI upset.  Discussed possibility of ulcer/h pylori and will increase OTC omeprazole from once daily to twice daily dosing for the next 1-2 weeks.  Encouraged OTC Maalox or Mylanta to help with upper GI symptoms.  FOBT negative in clinic.  No internal/external hemorrhoids palpated on exam.  Will have repeat CBC/CMP sent STAT to lab for re-evaluation of hemoglobin/hematocrit.    Labs 06/23/2020 with [facility] showing hgb 14.0, hct 40.8, platelets 203, WBC 9.5 and sodium decreased at 3.2  Plan: 1. STAT labs sent 2. H. Pylori breath test ordered 3. Increase omeprazole from once daily to twice daily for the next 1-2 weeks and can use OTC Maalox or Mylanta, avoiding Pepto Bismal as can cause black color stools 4. Begin symptom journal/diary 5. RTC if symptoms worsen/fail to improve with current treatment

## 2020-06-24 NOTE — Progress Notes (Signed)
Subjective:    Patient ID: Patricia Pearson, female    DOB: Oct 28, 1996, 24 y.o.   MRN: [id number]  Patricia Pearson is a 24 y.o. female presenting on 06/24/2020 for GI issues (mild abdominal pain, nauseated, diarrhea, w/ blood stools. The stool appears black x 3 days but when she wiped on today it was bright red blood.  Dizziness, hand and feet numbness x 3 days ) and Palpitations (race heart, Pulse averaging about 120 on Metoprolol. She started the Metoprolol x 2 days ago.  Intermittent chest pain in the center of the chest x 3 days )   HPI  Patricia Pearson presents to clinic for concerns of abdominal pain, nausea, diarrhea with bloody stools, that appear black x 3 days, with bright red blood on toilet tissue today.  Has some dizziness, hand and foot numbness x 3 days.  Has concerns for palpitations, feeling her heart race, has been on metoprolol x 2 days with intermittent chest pain in the center of her chest x 3 days.    Reports has recently had a holter monitor with her cardiologist but has not yet received the results.  Has completed a recent course of antibiotics and prednisone.  Has been to the emergency department with [place] on 06/22/2020 and with [facility] on 06/23/2020 with reported negative work up.  Has not taken anything for her GI symptoms prior to arrival.  Depression screen [facility] 2/9 04/29/2020 12/07/2019 09/23/2019  Decreased Interest 0 0 0  Down, Depressed, Hopeless 0 0 0  PHQ - 2 Score 0 0 0  Altered sleeping 0 - 0  Tired, decreased energy 0 - 0  Change in appetite 0 - 0  Feeling bad or failure about yourself  0 - 0  Trouble concentrating 0 - 0  Moving slowly or fidgety/restless 0 - 0  Suicidal thoughts 0 - 0  PHQ-9 Score 0 - 0  Difficult doing work/chores Not difficult at all - Not difficult at all    Social History   Tobacco Use  . Smoking status: Never Smoker  . Smokeless tobacco: Never Used  Vaping Use  . Vaping Use: Never used  Substance Use Topics  . Alcohol  use: No  . Drug use: No    Review of Systems  Constitutional: Negative.   HENT: Negative.   Eyes: Negative.   Respiratory: Negative.   Cardiovascular: Positive for palpitations. Negative for chest pain and leg swelling.  Gastrointestinal: Positive for abdominal pain, blood in stool, diarrhea and nausea. Negative for abdominal distention, anal bleeding, constipation, rectal pain and vomiting.  Endocrine: Negative.   Genitourinary: Negative.   Musculoskeletal: Negative.   Skin: Negative.   Allergic/Immunologic: Negative.   Neurological: Positive for dizziness. Negative for tremors, seizures, syncope, facial asymmetry, speech difficulty, weakness, light-headedness, numbness and headaches.  Hematological: Negative.   Psychiatric/Behavioral: Negative.    Per HPI unless specifically indicated above     Objective:    BP 112/77 (BP Location: Right Arm, Patient Position: Sitting, Cuff Size: Normal)   Pulse 99   Temp (!) 97.5 F (36.4 C) (Oral)   Resp 17   Ht 5\' 3"  (1.6 m)   Wt 160 lb (72.6 kg)   LMP 05/17/2020   SpO2 100%   BMI 28.34 kg/m   Wt Readings from Last 3 Encounters:  06/24/20 160 lb (72.6 kg)  06/22/20 160 lb (72.6 kg)  06/21/20 165 lb (74.8 kg)    Physical Exam Vitals reviewed.  Constitutional:  General: She is not in acute distress.    Appearance: Normal appearance. She is well-developed, well-groomed and overweight. She is not ill-appearing or toxic-appearing.  HENT:     Head: Normocephalic and atraumatic.     Nose:     Comments: Patricia Pearson is in place, covering mouth and nose. Eyes:     General: Lids are normal. Vision grossly intact.        Right eye: No discharge.        Left eye: No discharge.     Extraocular Movements: Extraocular movements intact.     Conjunctiva/sclera: Conjunctivae normal.     Pupils: Pupils are equal, round, and reactive to light.  Cardiovascular:     Rate and Rhythm: Normal rate and regular rhythm.     Pulses: Normal pulses.           Dorsalis pedis pulses are 2+ on the right side and 2+ on the left side.     Heart sounds: Normal heart sounds. No murmur heard.  No friction rub. No gallop.   Pulmonary:     Effort: Pulmonary effort is normal. No respiratory distress.     Breath sounds: Normal breath sounds.  Abdominal:     General: Abdomen is flat. There is no distension.     Palpations: Abdomen is soft. There is no hepatomegaly, splenomegaly or mass.     Tenderness: There is generalized abdominal tenderness. There is no right CVA tenderness, left CVA tenderness, guarding or rebound.     Hernia: No hernia is present.  Musculoskeletal:     Right lower leg: No edema.     Left lower leg: No edema.  Skin:    General: Skin is warm and dry.     Capillary Refill: Capillary refill takes less than 2 seconds.  Neurological:     General: No focal deficit present.     Mental Status: She is alert and oriented to person, place, and time.     Cranial Nerves: No cranial nerve deficit.     Sensory: No sensory deficit.     Motor: No weakness.     Gait: Gait normal.  Psychiatric:        Attention and Perception: Attention and perception normal.        Mood and Affect: Affect normal. Mood is anxious.        Speech: Speech normal.        Behavior: Behavior normal. Behavior is cooperative.        Thought Content: Thought content normal.        Cognition and Memory: Cognition and memory normal.        Judgment: Judgment normal.    Results for orders placed or performed in visit on 06/24/20  POC Hemoccult Bld/Stl (1-Cd Office Dx)  Result Value Ref Range   Card #1 Date 11/2021    Fecal Occult Blood, POC Negative Negative      Assessment & Plan:   Problem List Items Addressed This Visit      Other   Black stool - Primary    See abdominal pain A/P      Relevant Orders   CBC with Differential   COMPLETE METABOLIC PANEL WITH GFR   H. pylori breath test   POC Hemoccult Bld/Stl (1-Cd Office Dx) (Completed)    Abdominal pain    Abdominal pain x 3 days with nausea, diarrhea, bloody stools, palpitations, dizziness, hand and foot numbness.  Has recently completed antibiotics and prednisone, which can lead  to GI upset.  Discussed possibility of ulcer/h pylori and will increase OTC omeprazole from once daily to twice daily dosing for the next 1-2 weeks.  Encouraged OTC Maalox or Mylanta to help with upper GI symptoms.  FOBT negative in clinic.  No internal/external hemorrhoids palpated on exam.  Will have repeat CBC/CMP sent STAT to lab for re-evaluation of hemoglobin/hematocrit.    Labs 06/23/2020 with [facility] showing hgb 14.0, hct 40.8, platelets 203, WBC 9.5 and sodium decreased at 3.2  Plan: 1. STAT labs sent 2. H. Pylori breath test ordered 3. Increase omeprazole from once daily to twice daily for the next 1-2 weeks and can use OTC Maalox or Mylanta, avoiding Pepto Bismal as can cause black color stools 4. Begin symptom journal/diary 5. RTC if symptoms worsen/fail to improve with current treatment      Nausea    See abdominal pain A/P      Diarrhea    See abdominal pain A/P      Palpitations    Palpitations with c/o dizziness with hand and foot numbness x 3 days.  Patient continually checking smart watch for heart rate tracking while in office and during exam.  Reports HR 120-170's when at home, in office at [facility] records and HR at 76bpm with reported negative cardiac work up.  Reported holter monitor completed and awaiting follow up with cardiology.  Likely some underlying anxiety that may be contributing to symptoms.  Plan: 1. Labs ordered for evaluation 2. Encouraged patient to follow up with cardiology office 3. Continue metoprolol XL 25mg  daily 4. RTC as needed         No orders of the defined types were placed in this encounter.     Follow up plan: Return if symptoms worsen or fail to improve.   Harlin Rain, [facility]  [facility] 06/24/2020, 10:12 AM

## 2020-06-24 NOTE — Telephone Encounter (Signed)
Patient calling to check on status  Would like to know if she can get any advise before the weekend Please call to discuss

## 2020-06-24 NOTE — Assessment & Plan Note (Addendum)
Palpitations with c/o dizziness with hand and foot numbness x 3 days.  Patient continually checking smart watch for heart rate tracking while in office and during exam.  Reports HR 120-170's when at home, in office at [facility] records and HR at 76bpm with reported negative cardiac work up.  Reported holter monitor completed and awaiting follow up with cardiology.  Likely some underlying anxiety that may be contributing to symptoms.  Plan: 1. Labs ordered for evaluation 2. Encouraged patient to follow up with cardiology office 3. Continue metoprolol XL 25mg  daily 4. RTC as needed

## 2020-06-24 NOTE — Assessment & Plan Note (Signed)
See abdominal pain A/P

## 2020-06-24 NOTE — Patient Instructions (Signed)
Have your labs drawn before leaving clinic.  They will be sent out STAT and we will contact you with the results.  Have your h. Pylori breath test completed today.  Your exam is overall reassuring and the review of your labs from [facility] and [facility] emergency departments do not show any anemia and your fecal occult blood test in office was negative.  I would encourage you to increase your omeprazole to twice per day for the next week, can use over the counter Maalox or Mylanta.  I would avoid Pepto Bismal as that can cause black stool.  Avoid straining when having a bowel movement.  Increase your fluids and begin to keep a symptom journal/diary.  I have contacted Dr. Thereasa Solo office and they have received your message and routed to him for review.  We will plan to see you back if your symptoms worsen or fail to improve  You will receive a survey after today's visit either digitally by e-mail or paper by USPS mail. Your experiences and feedback matter to [place].  Please respond so we know how we are doing as we provide care for you.  Call us with any questions/concerns/needs.  It is my goal to be available to you for your health concerns.  Thanks for choosing me to be a partner in your healthcare needs!  Harlin Rain, FNP-C Family Nurse Practitioner [facility] Phone: [phone]

## 2020-06-27 ENCOUNTER — Telehealth: Payer: Self-pay | Admitting: Cardiology

## 2020-06-27 DIAGNOSIS — Z01818 Encounter for other preprocedural examination: Secondary | ICD-10-CM | POA: Diagnosis not present

## 2020-06-27 DIAGNOSIS — Z20822 Contact with and (suspected) exposure to covid-19: Secondary | ICD-10-CM | POA: Diagnosis not present

## 2020-06-27 LAB — H. PYLORI BREATH TEST: H. pylori Breath Test: NOT DETECTED

## 2020-06-27 NOTE — Telephone Encounter (Signed)
Patient calling  Patient has an upcoming appt with Dr Garen Lah on 07/08/20  Patient is suppose to have surgery on her knee and they are wanting ASAP  Would like to know if heart monitor results are in and if she can move appt up as a telehealth visit to be cleared Please call to discuss

## 2020-06-28 ENCOUNTER — Other Ambulatory Visit: Payer: BC Managed Care – PPO

## 2020-06-28 DIAGNOSIS — R002 Palpitations: Secondary | ICD-10-CM | POA: Diagnosis not present

## 2020-06-28 NOTE — Telephone Encounter (Signed)
Called and spoke with patient. [employer] has her processing data start date at 06/21/20. I informed her I will check for these results daily and inform Dr. Garen Lah as soon as they are available as she stated they want to perform her surgery asap.

## 2020-07-01 DIAGNOSIS — Z20822 Contact with and (suspected) exposure to covid-19: Secondary | ICD-10-CM | POA: Diagnosis not present

## 2020-07-01 NOTE — Telephone Encounter (Signed)
°  Patient Consent for Virtual Visit         Patricia Pearson has provided verbal consent on 07/01/2020 for a virtual visit (video or telephone).   CONSENT FOR VIRTUAL VISIT FOR:  Patricia Pearson  By participating in this virtual visit I agree to the following:  I hereby voluntarily request, consent and authorize [facility] and its employed or contracted physicians, Engineer, materials, nurse practitioners or other licensed health care professionals (the Practitioner), to provide me with telemedicine health care services (the Services") as deemed necessary by the treating Practitioner. I acknowledge and consent to receive the Services by the Practitioner via telemedicine. I understand that the telemedicine visit will involve communicating with the Practitioner through live audiovisual communication technology and the disclosure of certain medical information by electronic transmission. I acknowledge that I have been given the opportunity to request an in-person assessment or other available alternative prior to the telemedicine visit and am voluntarily participating in the telemedicine visit.  I understand that I have the right to withhold or withdraw my consent to the use of telemedicine in the course of my care at any time, without affecting my right to future care or treatment, and that the Practitioner or I may terminate the telemedicine visit at any time. I understand that I have the right to inspect all information obtained and/or recorded in the course of the telemedicine visit and may receive copies of available information for a reasonable fee.  I understand that some of the potential risks of receiving the Services via telemedicine include:   Delay or interruption in medical evaluation due to technological equipment failure or disruption;  Information transmitted may not be sufficient (e.g. poor resolution of images) to allow for appropriate medical decision making by the  Practitioner; and/or   In rare instances, security protocols could fail, causing a breach of personal health information.  Furthermore, I acknowledge that it is my responsibility to provide information about my medical history, conditions and care that is complete and accurate to the best of my ability. I acknowledge that Practitioner's advice, recommendations, and/or decision may be based on factors not within their control, such as incomplete or inaccurate data provided by me or distortions of diagnostic images or specimens that may result from electronic transmissions. I understand that the practice of medicine is not an exact science and that Practitioner makes no warranties or guarantees regarding treatment outcomes. I acknowledge that a copy of this consent can be made available to me via my patient portal (Patricia Pearson), or I can request a printed copy by calling the office of [facility].    I understand that my insurance will be billed for this visit.   I have read or had this consent read to me.  I understand the contents of this consent, which adequately explains the benefits and risks of the Services being provided via telemedicine.   I have been provided ample opportunity to ask questions regarding this consent and the Services and have had my questions answered to my satisfaction.  I give my informed consent for the services to be provided through the use of telemedicine in my medical care

## 2020-07-01 NOTE — Telephone Encounter (Signed)
Called and spoke with patient to give Zio monitor results and switched her to a tele health visit per okay from Dr. Garen Lah.

## 2020-07-08 ENCOUNTER — Other Ambulatory Visit: Payer: Self-pay

## 2020-07-08 ENCOUNTER — Telehealth (INDEPENDENT_AMBULATORY_CARE_PROVIDER_SITE_OTHER): Payer: BC Managed Care – PPO | Admitting: Cardiology

## 2020-07-08 ENCOUNTER — Encounter: Payer: Self-pay | Admitting: Cardiology

## 2020-07-08 ENCOUNTER — Ambulatory Visit: Payer: Self-pay | Admitting: *Deleted

## 2020-07-08 VITALS — HR 112 | Ht 63.0 in | Wt 165.0 lb

## 2020-07-08 DIAGNOSIS — R000 Tachycardia, unspecified: Secondary | ICD-10-CM

## 2020-07-08 DIAGNOSIS — R002 Palpitations: Secondary | ICD-10-CM

## 2020-07-08 MED ORDER — METOPROLOL SUCCINATE ER 25 MG PO TB24: 12.5000 mg | ORAL_TABLET | Freq: Every day | ORAL | 2 refills | Status: DC

## 2020-07-08 NOTE — Progress Notes (Signed)
Virtual Visit via Telephone Note   This visit type was conducted due to national recommendations for restrictions regarding the COVID-19 Pandemic (e.g. social distancing) in an effort to limit this patient's exposure and mitigate transmission in our community.  Due to her co-morbid illnesses, this patient is at least at moderate risk for complications without adequate follow up.  This format is felt to be most appropriate for this patient at this time.  The patient did not have access to video technology/had technical difficulties with video requiring transitioning to audio format only (telephone).  All issues noted in this document were discussed and addressed.  No physical exam could be performed with this format.  Please refer to the patient's chart for her  consent to telehealth for [facility].   Date:  07/08/2020   ID:  Hoehne, DOB 08-Apr-1996, MRN [id number]  Patient Location: Home Provider Location: Office/Clinic  PCP:  Olin Hauser, DO  Cardiologist:  Kate Sable, MD  Electrophysiologist:  None   Evaluation Performed:  Follow-Up Visit  Chief Complaint:  palpitations  History of Present Illness:    Patricia Pearson is a 24 y.o. female with history of anxiety who presents for follow-up.  Last seen due to palpitations for over a year.  Times associated with shortness of breath and dizziness.  Sometimes lasting over 30 minutes.  Cardiac monitor was placed to evaluate any significant arrhythmias.  She states still having symptoms of palpitations although it is improved since starting metoprolol.  She otherwise feels fine, has no concerns at this time.  The patient does not have symptoms concerning for COVID-19 infection (fever, chills, cough, or new shortness of breath).    Past Medical History:  Diagnosis Date  . Anemia   . Anxiety   . Kidney stone    Past Surgical History:  Procedure Laterality Date  . STENT PLACEMENT RT URETER ([facility] HX)  Right 04/09/2013  . WISDOM TOOTH EXTRACTION  2016     Current Meds  Medication Sig  . clindamycin (CLEOCIN T) 1 % lotion clindamycin 1 % lotion  APPLY TO AFFECTED AREA EVERY DAY  . etonogestrel (NEXPLANON) 68 MG IMPL implant 1 each by Subdermal route once.  . ferrous sulfate 325 (65 FE) MG EC tablet Take 325 mg by mouth daily.  Marland Kitchen ibuprofen (ADVIL) 600 MG tablet Take 1 tablet (600 mg total) by mouth every 6 (six) hours as needed for headache, mild pain, moderate pain or cramping.  . metoprolol succinate (TOPROL XL) 25 MG 24 hr tablet Take 0.5 tablets (12.5 mg total) by mouth daily.  . [DISCONTINUED] metoprolol succinate (TOPROL XL) 25 MG 24 hr tablet Take 0.5 tablets (12.5 mg total) by mouth daily. May increase to 1 whole tablet (25mg ) daily in future if not improved.     Allergies:   Cephalexin   Social History   Tobacco Use  . Smoking status: Never Smoker  . Smokeless tobacco: Never Used  Vaping Use  . Vaping Use: Never used  Substance Use Topics  . Alcohol use: No  . Drug use: No     Family Hx: The patient's family history includes Anxiety disorder in her mother; Basal cell carcinoma in her mother; Drug abuse in her father; Heart failure in her maternal grandfather; Lung cancer in her paternal grandmother; Peptic Ulcer in her father. There is no history of Colon cancer, Cervical cancer, or Breast cancer.  ROS:   Please see the history of present illness.  All other systems reviewed and are negative.   Prior CV studies:   The following studies were reviewed today:    Labs/Other Tests and Data Reviewed:    EKG:  No ECG reviewed.  Recent Labs: 06/24/2020: ALT 14; BUN 10; Creat 0.81; Hemoglobin 13.7; Platelets 219; Potassium 3.9; Sodium 136   Recent Lipid Panel No results found for: CHOL, TRIG, HDL, CHOLHDL, LDLCALC, LDLDIRECT  Wt Readings from Last 3 Encounters:  07/08/20 165 lb (74.8 kg)  06/24/20 160 lb (72.6 kg)  06/22/20 160 lb (72.6 kg)     Objective:     Vital Signs:  Pulse (!) 112   Ht 5\' 3"  (1.6 m)   Wt 165 lb (74.8 kg)   BMI 29.23 kg/m    VITAL SIGNS:  reviewed  ASSESSMENT & PLAN:    1.  Patient has  history of palpitations.  2-week cardiac monitor evaluated, no significant arrhythmias noted.  Patient triggered events were associated with sinus tachycardia.  Overall benign cardiac monitor.  Patient's history of anxiety likely playing a role.  Toprol-XL 12.5 mg daily is helping patient.  She was advised to continue since it is helping.  Can increase to 25 mg daily as needed.  No indication for additional cardiac testing at this time.   COVID-19 Education: The signs and symptoms of COVID-19 were discussed with the patient and how to seek care for testing (follow up with PCP or arrange E-visit).  The importance of social distancing was discussed today.  Time:   Today, I have spent 35 minutes with the patient with telehealth technology discussing the above problems.     Medication Adjustments/Labs and Tests Ordered: Current medicines are reviewed at length with the patient today.  Concerns regarding medicines are outlined above.   Tests Ordered: No orders of the defined types were placed in this encounter.   Medication Changes: Meds ordered this encounter  Medications  . metoprolol succinate (TOPROL XL) 25 MG 24 hr tablet    Sig: Take 0.5 tablets (12.5 mg total) by mouth daily.    Dispense:  45 tablet    Refill:  2    Follow Up:  In Person prn  Signed, Kate Sable, MD  07/08/2020 5:06 PM    [place]

## 2020-07-08 NOTE — Patient Instructions (Signed)
Medication Instructions:  Your physician recommends that you continue on your current medications as directed. Please refer to the Current Medication list given to you today.  Metoprolol has been refilled today.  *If you need a refill on your cardiac medications before your next appointment, please call your pharmacy*   Lab Work: None ordered If you have labs (blood work) drawn today and your tests are completely normal, you will receive your results only by:  [facility] (if you have MyChart) OR  A paper copy in the mail If you have any lab test that is abnormal or we need to change your treatment, we will call you to review the results.   Testing/Procedures: None ordered   Follow-Up: At [facility], you and your health needs are our priority.  As part of our continuing mission to provide you with exceptional heart care, we have created designated Provider Care Teams.  These Care Teams include your primary Cardiologist (physician) and Advanced Practice Providers (APPs -  Physician Assistants and Nurse Practitioners) who all work together to provide you with the care you need, when you need it.  We recommend signing up for the patient portal called "MyChart".  Sign up information is provided on this After Visit Summary.  MyChart is used to connect with patients for Virtual Visits (Telemedicine).  Patients are able to view lab/test results, encounter notes, upcoming appointments, etc.  Non-urgent messages can be sent to your provider as well.   To learn more about what you can do with MyChart, go to [email or web address].    Your next appointment:   As needed   The format for your next appointment:   In Person  Provider:    You may see Kate Sable, MD or one of the following Advanced Practice Providers on your designated Care Team:    Murray Hodgkins, NP  Christell Faith, PA-C  Marrianne Mood, PA-C    Other Instructions N/A

## 2020-07-08 NOTE — Telephone Encounter (Signed)
Patient called stating that she has had on going bilateral ear congestion.  She has been on several different antibiotic but sates the symptoms persist. She has headache and itching within her ears.  She states that her ears feel wet but she denies drainage. She has no fever. Ear problems started with sinus infection. Pain is sharp stabbing occassional pains in the ears. She was taking Flonase nasal spray and Musenix bit quit when her antibiotic was completed.  Per protocol My Chart visit was scheduled Tuesday. Care advice was read to patient.  She verbalized understanding of all information Reason for Disposition . Ear congestion present > 48 hours  Answer Assessment - Initial Assessment Questions 1. LOCATION: "Which ear is involved?"       Both ears 2. SENSATION: "Describe how the ear feels." (e.g. stuffy, full, plugged)."   itchy,rt ear fullness 3. ONSET:  "When did the ear symptoms start?"       July 30th omnicef  augmentin 4. PAIN: "Do you also have an earache?" If Yes, ask: "How bad is it?" (Scale 1-10; or mild, moderate, severe)     Headache not severe 5. CAUSE: "What do you think is causing the ear congestion?"    July had sinus infection 6. URI: "Do you have a runny nose or cough?"      none 7. NASAL ALLERGIES: "Are there symptoms of hay fever, such as sneezing or a clear nasal discharge?"     no 8. PREGNANCY: "Is there any chance you are pregnant?" "When was your last menstrual period?"     No on cycle now  Protocols used: EAR - CONGESTION-A-AH

## 2020-07-08 NOTE — Telephone Encounter (Signed)
Patient completed antibiotics last week and symptoms have not improved, Pain in both ears, experiencing itchiness and head ache, seeking clinical advice/ patient was seen in the urgent care twice and states PCP is aware.  Attempted to call patient- left message to call back

## 2020-07-12 ENCOUNTER — Encounter: Payer: Self-pay | Admitting: Family Medicine

## 2020-07-12 ENCOUNTER — Other Ambulatory Visit: Payer: Self-pay

## 2020-07-12 ENCOUNTER — Telehealth (INDEPENDENT_AMBULATORY_CARE_PROVIDER_SITE_OTHER): Payer: 59 | Admitting: Family Medicine

## 2020-07-12 ENCOUNTER — Telehealth: Payer: Self-pay | Admitting: Cardiology

## 2020-07-12 DIAGNOSIS — H938X1 Other specified disorders of right ear: Secondary | ICD-10-CM

## 2020-07-12 DIAGNOSIS — F411 Generalized anxiety disorder: Secondary | ICD-10-CM

## 2020-07-12 DIAGNOSIS — Z881 Allergy status to other antibiotic agents status: Secondary | ICD-10-CM | POA: Diagnosis not present

## 2020-07-12 DIAGNOSIS — R002 Palpitations: Secondary | ICD-10-CM

## 2020-07-12 DIAGNOSIS — Z791 Long term (current) use of non-steroidal anti-inflammatories (NSAID): Secondary | ICD-10-CM | POA: Diagnosis not present

## 2020-07-12 DIAGNOSIS — M23632 Other spontaneous disruption of medial collateral ligament of left knee: Secondary | ICD-10-CM | POA: Diagnosis not present

## 2020-07-12 DIAGNOSIS — G8918 Other acute postprocedural pain: Secondary | ICD-10-CM | POA: Diagnosis not present

## 2020-07-12 DIAGNOSIS — M25562 Pain in left knee: Secondary | ICD-10-CM | POA: Diagnosis not present

## 2020-07-12 DIAGNOSIS — R519 Headache, unspecified: Secondary | ICD-10-CM

## 2020-07-12 DIAGNOSIS — S83015D Lateral dislocation of left patella, subsequent encounter: Secondary | ICD-10-CM | POA: Diagnosis not present

## 2020-07-12 DIAGNOSIS — S8332XA Tear of articular cartilage of left knee, current, initial encounter: Secondary | ICD-10-CM | POA: Diagnosis not present

## 2020-07-12 DIAGNOSIS — M2212 Recurrent subluxation of patella, left knee: Secondary | ICD-10-CM | POA: Diagnosis not present

## 2020-07-12 DIAGNOSIS — H6981 Other specified disorders of Eustachian tube, right ear: Secondary | ICD-10-CM | POA: Diagnosis not present

## 2020-07-12 DIAGNOSIS — S83095A Other dislocation of left patella, initial encounter: Secondary | ICD-10-CM | POA: Diagnosis not present

## 2020-07-12 DIAGNOSIS — M2419 Other articular cartilage disorders, other specified site: Secondary | ICD-10-CM | POA: Diagnosis not present

## 2020-07-12 DIAGNOSIS — Z7952 Long term (current) use of systemic steroids: Secondary | ICD-10-CM | POA: Diagnosis not present

## 2020-07-12 DIAGNOSIS — F410 Panic disorder [episodic paroxysmal anxiety] without agoraphobia: Secondary | ICD-10-CM

## 2020-07-12 DIAGNOSIS — R000 Tachycardia, unspecified: Secondary | ICD-10-CM | POA: Diagnosis not present

## 2020-07-12 DIAGNOSIS — S83005A Unspecified dislocation of left patella, initial encounter: Secondary | ICD-10-CM | POA: Diagnosis not present

## 2020-07-12 DIAGNOSIS — Z885 Allergy status to narcotic agent status: Secondary | ICD-10-CM | POA: Diagnosis not present

## 2020-07-12 NOTE — Patient Instructions (Addendum)
Restart nasal steroid Flonase 2 sprays in each nostril daily for 4-6 weeks, may repeat course seasonally or as needed Trial on OTC allergy meds still  [facility] [address]  [place], [place] [address] Ph: [phone]   Stay tuned for apt with Psychiatry - will send to these locations. Likely [facility] if they are still accepting new patients.  [facility] Address: [address], [place], [place] [address] [email or web address] Phone: [phone]  [facility] [address], [place] [address] Johnnette Litter P: [place] ([place] [facility]) Address: [address], [place], [place] [address] Hours: 8:30AM-5PM Phone: [phone]   Please schedule a Follow-up Appointment to: Return if symptoms worsen or fail to improve.  If you have any other questions or concerns, please feel free to call the office or send a message through [place]. You may also schedule an earlier appointment if necessary.  Additionally, you may be receiving a survey about your experience at our office within a few days to 1 week by e-mail or mail. We value your feedback.  Nobie Putnam, DO [place]

## 2020-07-12 NOTE — Telephone Encounter (Signed)
Attempted to schedule. No ans no vm   Needs 3 m fu

## 2020-07-12 NOTE — Progress Notes (Signed)
Subjective:    Patient ID: Patricia Pearson, female    DOB: 1995/11/17, 24 y.o.   MRN: [id number]  Patricia Pearson is a 24 y.o. female presenting on 07/12/2020 for Ear Pain (onset month--HA--btw patient is having Knee surgery today around 11:45 am)  Virtual / Telehealth Encounter - Video Visit via MyChart The purpose of this virtual visit is to provide medical care while limiting exposure to the novel coronavirus (COVID19) for both patient and office staff.  Consent was obtained for remote visit:  Yes.   Answered questions that patient had about telehealth interaction:  Yes.   I discussed the limitations, risks, security and privacy concerns of performing an evaluation and management service by video/telephone. I also discussed with the patient that there may be a patient responsible charge related to this service. The patient expressed understanding and agreed to proceed.  Patient Location: Home Provider Location: [facility] (Office)   HPI   Right Ear Pressure, pain, headaches Problem for past 1 month, initially diagnosed with R ear infection by urgent care Lay down on R side, feels like R side has more pressure and pain Tried OTC nasal spray Flonase. limited results Stopped Mucinex - had been on this and antibiotics in past. Has completed Cipro, Levaquin, Augmentin, Prednisone. Did not finish Prednisone - last took about 3 weeks ago. Denies any reduced hearing on R side, ear drainage, dizziness lightheadedness, nausea, change of smell or taste, cough fever chills  Generalized Anxiety Panic Attacks Palpitations Followed by Cardiology, had Zio heart monitor done, ultimately she has done well from cardiac perspective and doing well on half dose Metoprolol XL 12.5mg  daily (half of 25mg ) - helps her palpitations, but still has the persistent anxiety component she believes now is causing most of her symptoms. Requesting referral today   Depression screen [facility] 2/9  07/12/2020 04/29/2020 12/07/2019  Decreased Interest 0 0 0  Down, Depressed, Hopeless 0 0 0  PHQ - 2 Score 0 0 0  Altered sleeping - 0 -  Tired, decreased energy - 0 -  Change in appetite - 0 -  Feeling bad or failure about yourself  - 0 -  Trouble concentrating - 0 -  Moving slowly or fidgety/restless - 0 -  Suicidal thoughts - 0 -  PHQ-9 Score - 0 -  Difficult doing work/chores - Not difficult at all -   GAD 7 : Generalized Anxiety Score 07/12/2020 08/10/2019 04/05/2017 03/04/2017  Nervous, Anxious, on Edge 2 0 1 2  Control/stop worrying 2 1 1 3   Worry too much - different things 2 1 1 3   Trouble relaxing 1 0 0 0  Restless 0 0 0 0  Easily annoyed or irritable 0 2 0 0  Afraid - awful might happen 1 1 0 0  Total GAD 7 Score 8 5 3 8   Anxiety Difficulty Somewhat difficult Not difficult at all Not difficult at all -     Social History   Tobacco Use  . Smoking status: Never Smoker  . Smokeless tobacco: Never Used  Vaping Use  . Vaping Use: Never used  Substance Use Topics  . Alcohol use: No  . Drug use: No    Review of Systems Per HPI unless specifically indicated above     Objective:    There were no vitals taken for this visit.  Wt Readings from Last 3 Encounters:  07/08/20 165 lb (74.8 kg)  06/24/20 160 lb (72.6 kg)  06/22/20 160 lb (  72.6 kg)    Physical Exam   Note examination was completely remotely via video observation objective data only  Gen - well-appearing, no acute distress or apparent pain, comfortable HEENT - eyes appear clear without discharge or redness Heart/Lungs - cannot examine virtually - observed no evidence of coughing or labored breathing. Skin - face visible today- no rash Neuro - awake, alert, oriented Psych - not anxious appearing  Results for orders placed or performed in visit on 06/24/20  CBC with Differential  Result Value Ref Range   WBC 7.9 3.8 - 10.8 Thousand/uL   RBC 4.60 3.80 - 5.10 Million/uL   Hemoglobin 13.7 11.7 - 15.5 g/dL     HCT 41.3 35 - 45 %   MCV 89.8 80.0 - 100.0 fL   MCH 29.8 27.0 - 33.0 pg   MCHC 33.2 32.0 - 36.0 g/dL   RDW 12.4 11.0 - 15.0 %   Platelets 219 140 - 400 Thousand/uL   MPV 11.4 7.5 - 12.5 fL   Neutro Abs 5,159 1,500 - 7,800 cells/uL   Lymphs Abs 1,967 850 - 3,900 cells/uL   Absolute Monocytes 616 200 - 950 cells/uL   Eosinophils Absolute 111 15 - 500 cells/uL   Basophils Absolute 47 0 - 200 cells/uL   Neutrophils Relative % 65.3 %   Total Lymphocyte 24.9 %   Monocytes Relative 7.8 %   Eosinophils Relative 1.4 %   Basophils Relative 0.6 %  COMPLETE METABOLIC PANEL WITH GFR  Result Value Ref Range   Glucose, Bld 80 65 - 99 mg/dL   BUN 10 7 - 25 mg/dL   Creat 0.81 0.50 - 1.10 mg/dL   GFR, Est Non African American 104 > OR = 60 mL/min/1.50m2   GFR, Est African American 120 > OR = 60 mL/min/1.2m2   BUN/Creatinine Ratio NOT APPLICABLE 6 - 22 (calc)   Sodium 136 135 - 146 mmol/L   Potassium 3.9 3.5 - 5.3 mmol/L   Chloride 101 98 - 110 mmol/L   CO2 30 20 - 32 mmol/L   Calcium 8.8 8.6 - 10.2 mg/dL   Total Protein 7.1 6.1 - 8.1 g/dL   Albumin 4.3 3.6 - 5.1 g/dL   Globulin 2.8 1.9 - 3.7 g/dL (calc)   AG Ratio 1.5 1.0 - 2.5 (calc)   Total Bilirubin 0.6 0.2 - 1.2 mg/dL   Alkaline phosphatase (APISO) 67 31 - 125 U/L   AST 11 10 - 30 U/L   ALT 14 6 - 29 U/L  H. pylori breath test  Result Value Ref Range   H. pylori Breath Test NOT DETECTED NOT DETECT  POC Hemoccult Bld/Stl (1-Cd Office Dx)  Result Value Ref Range   Card #1 Date 11/2021    Fecal Occult Blood, POC Negative Negative      Assessment & Plan:   Problem List Items Addressed This Visit    Palpitations   Generalized anxiety disorder with panic attacks - Primary   Relevant Orders   Ambulatory referral to Psychiatry    Other Visit Diagnoses    Ear fullness, right       Relevant Orders   Ambulatory referral to ENT   Eustachian tube dysfunction, right       Relevant Orders   Ambulatory referral to ENT    Nonintractable episodic headache, unspecified headache type       Relevant Medications   oxyCODONE (OXY IR/ROXICODONE) 5 MG immediate release tablet   Other Relevant Orders   Ambulatory referral to  ENT     generalized anxiety disorder with panic attacks, palpitations, has had cardiology work up with heart monitor, and on beta blocker due to anxiety/panic tachycardia symptoms, now requesting consultation with Psychiatrist and therapist for helping control her anxiety  - Keep on Metoprolol 12.5mg  XL daily (half tab of 25mg )   Persistent R ear fullness and pressure, sinus headaches, originally diagnosed in urgent care with ear infection, has taken antibiotics already (levaquin, augmentin, cipro) and prednisone course, Flonase nasal spray, limited results, still has some R ear pain and pressure, no drainage out of ear, no loss of hearing. She is requesting consultation with ENT    Orders Placed This Encounter  Procedures  . Ambulatory referral to ENT    Referral Priority:   Routine    Referral Type:   Consultation    Referral Reason:   Specialty Services Required    Requested Specialty:   Otolaryngology    Number of Visits Requested:   1  . Ambulatory referral to Psychiatry    Referral Priority:   Routine    Referral Type:   Psychiatric    Referral Reason:   Specialty Services Required    Requested Specialty:   Psychiatry    Number of Visits Requested:   1      No orders of the defined types were placed in this encounter.     Follow up plan: Return if symptoms worsen or fail to improve.  Patient verbalizes understanding with the above medical recommendations including the limitation of remote medical advice.  Specific follow-up and call-back criteria were given for patient to follow-up or seek medical care more urgently if needed.  Total duration of direct patient care provided via video conference: 15 minutes    Nobie Putnam, [facility] 07/12/2020, 9:15 AM

## 2020-07-19 DIAGNOSIS — S83015D Lateral dislocation of left patella, subsequent encounter: Secondary | ICD-10-CM | POA: Diagnosis not present

## 2020-07-19 DIAGNOSIS — M2202 Recurrent dislocation of patella, left knee: Secondary | ICD-10-CM | POA: Diagnosis not present

## 2020-07-20 DIAGNOSIS — J328 Other chronic sinusitis: Secondary | ICD-10-CM | POA: Diagnosis not present

## 2020-07-26 DIAGNOSIS — S83015D Lateral dislocation of left patella, subsequent encounter: Secondary | ICD-10-CM | POA: Diagnosis not present

## 2020-07-26 DIAGNOSIS — M2202 Recurrent dislocation of patella, left knee: Secondary | ICD-10-CM | POA: Diagnosis not present

## 2020-07-28 DIAGNOSIS — F411 Generalized anxiety disorder: Secondary | ICD-10-CM | POA: Diagnosis not present

## 2020-07-28 DIAGNOSIS — F410 Panic disorder [episodic paroxysmal anxiety] without agoraphobia: Secondary | ICD-10-CM | POA: Diagnosis not present

## 2020-07-29 DIAGNOSIS — M2202 Recurrent dislocation of patella, left knee: Secondary | ICD-10-CM | POA: Diagnosis not present

## 2020-07-29 DIAGNOSIS — S83015D Lateral dislocation of left patella, subsequent encounter: Secondary | ICD-10-CM | POA: Diagnosis not present

## 2020-08-03 DIAGNOSIS — M2202 Recurrent dislocation of patella, left knee: Secondary | ICD-10-CM | POA: Diagnosis not present

## 2020-08-03 DIAGNOSIS — S83015D Lateral dislocation of left patella, subsequent encounter: Secondary | ICD-10-CM | POA: Diagnosis not present

## 2020-08-09 DIAGNOSIS — J328 Other chronic sinusitis: Secondary | ICD-10-CM | POA: Diagnosis not present

## 2020-08-10 DIAGNOSIS — Z3046 Encounter for surveillance of implantable subdermal contraceptive: Secondary | ICD-10-CM | POA: Diagnosis not present

## 2020-08-11 DIAGNOSIS — F410 Panic disorder [episodic paroxysmal anxiety] without agoraphobia: Secondary | ICD-10-CM | POA: Diagnosis not present

## 2020-08-11 DIAGNOSIS — S83015D Lateral dislocation of left patella, subsequent encounter: Secondary | ICD-10-CM | POA: Diagnosis not present

## 2020-08-11 DIAGNOSIS — F411 Generalized anxiety disorder: Secondary | ICD-10-CM | POA: Diagnosis not present

## 2020-08-11 DIAGNOSIS — M2202 Recurrent dislocation of patella, left knee: Secondary | ICD-10-CM | POA: Diagnosis not present

## 2020-08-13 DIAGNOSIS — H109 Unspecified conjunctivitis: Secondary | ICD-10-CM | POA: Diagnosis not present

## 2020-08-15 DIAGNOSIS — Z3009 Encounter for other general counseling and advice on contraception: Secondary | ICD-10-CM | POA: Diagnosis not present

## 2020-08-17 DIAGNOSIS — M2202 Recurrent dislocation of patella, left knee: Secondary | ICD-10-CM | POA: Diagnosis not present

## 2020-08-17 DIAGNOSIS — S83015D Lateral dislocation of left patella, subsequent encounter: Secondary | ICD-10-CM | POA: Diagnosis not present

## 2020-08-24 DIAGNOSIS — S83015D Lateral dislocation of left patella, subsequent encounter: Secondary | ICD-10-CM | POA: Diagnosis not present

## 2020-08-24 DIAGNOSIS — M2202 Recurrent dislocation of patella, left knee: Secondary | ICD-10-CM | POA: Diagnosis not present

## 2020-08-25 DIAGNOSIS — F411 Generalized anxiety disorder: Secondary | ICD-10-CM | POA: Diagnosis not present

## 2020-08-25 DIAGNOSIS — F410 Panic disorder [episodic paroxysmal anxiety] without agoraphobia: Secondary | ICD-10-CM | POA: Diagnosis not present

## 2020-08-26 DIAGNOSIS — S83015D Lateral dislocation of left patella, subsequent encounter: Secondary | ICD-10-CM | POA: Diagnosis not present

## 2020-08-26 DIAGNOSIS — M2202 Recurrent dislocation of patella, left knee: Secondary | ICD-10-CM | POA: Diagnosis not present

## 2020-08-29 DIAGNOSIS — S83015D Lateral dislocation of left patella, subsequent encounter: Secondary | ICD-10-CM | POA: Diagnosis not present

## 2020-08-29 DIAGNOSIS — M2202 Recurrent dislocation of patella, left knee: Secondary | ICD-10-CM | POA: Diagnosis not present

## 2020-09-01 DIAGNOSIS — S83015D Lateral dislocation of left patella, subsequent encounter: Secondary | ICD-10-CM | POA: Diagnosis not present

## 2020-09-01 DIAGNOSIS — M2202 Recurrent dislocation of patella, left knee: Secondary | ICD-10-CM | POA: Diagnosis not present

## 2020-09-02 DIAGNOSIS — J328 Other chronic sinusitis: Secondary | ICD-10-CM | POA: Diagnosis not present

## 2020-09-02 DIAGNOSIS — J301 Allergic rhinitis due to pollen: Secondary | ICD-10-CM | POA: Diagnosis not present

## 2020-09-02 DIAGNOSIS — J039 Acute tonsillitis, unspecified: Secondary | ICD-10-CM | POA: Diagnosis not present

## 2020-09-07 DIAGNOSIS — S83015D Lateral dislocation of left patella, subsequent encounter: Secondary | ICD-10-CM | POA: Diagnosis not present

## 2020-09-07 DIAGNOSIS — M2202 Recurrent dislocation of patella, left knee: Secondary | ICD-10-CM | POA: Diagnosis not present

## 2020-09-09 DIAGNOSIS — F410 Panic disorder [episodic paroxysmal anxiety] without agoraphobia: Secondary | ICD-10-CM | POA: Diagnosis not present

## 2020-09-15 ENCOUNTER — Other Ambulatory Visit: Payer: Self-pay | Admitting: Obstetrics and Gynecology

## 2020-09-16 ENCOUNTER — Other Ambulatory Visit: Payer: Self-pay

## 2020-09-16 ENCOUNTER — Encounter: Payer: Self-pay | Admitting: Family Medicine

## 2020-09-16 ENCOUNTER — Ambulatory Visit (INDEPENDENT_AMBULATORY_CARE_PROVIDER_SITE_OTHER): Payer: 59 | Admitting: Family Medicine

## 2020-09-16 VITALS — BP 109/69 | HR 83 | Temp 98.5°F | Ht 63.0 in | Wt 165.0 lb

## 2020-09-16 DIAGNOSIS — S83015D Lateral dislocation of left patella, subsequent encounter: Secondary | ICD-10-CM | POA: Diagnosis not present

## 2020-09-16 DIAGNOSIS — R300 Dysuria: Secondary | ICD-10-CM | POA: Diagnosis not present

## 2020-09-16 DIAGNOSIS — M2202 Recurrent dislocation of patella, left knee: Secondary | ICD-10-CM | POA: Diagnosis not present

## 2020-09-16 MED ORDER — PHENAZOPYRIDINE HCL 100 MG PO TABS: 100.0000 mg | ORAL_TABLET | Freq: Three times a day (TID) | ORAL | 0 refills | Status: AC | PRN

## 2020-09-16 MED ORDER — SULFAMETHOXAZOLE-TRIMETHOPRIM 800-160 MG PO TABS: 1.0000 | ORAL_TABLET | Freq: Two times a day (BID) | ORAL | 0 refills | Status: AC

## 2020-09-16 NOTE — Addendum Note (Signed)
Addended by: Clista Bernhardt on: 09/16/2020 03:59 PM   Modules accepted: Orders

## 2020-09-16 NOTE — Assessment & Plan Note (Signed)
Sending urine to the lab for culture based on symptoms.  Will treat empirically with bactrim DS 1 tablet BID x 5 days and pyridium 100mg  TID PRN for dysuria.    Plan: 1. Urine sent to the lab for culture 2. Begin bactrim DS, 1 tablet BID x 5 days 3. Begin pyridium 100mg  TID PRN x 2 days 4. RTC if symptoms worsen or fail to improve

## 2020-09-16 NOTE — Progress Notes (Signed)
Subjective:    Patient ID: Patricia Pearson, female    DOB: 03/18/1996, 24 y.o.   MRN: [id number]  Patricia Pearson is a 24 y.o. female presenting on 09/16/2020 for Urinary Frequency (Frequency and burning. Right lower back pain. Started Monday of this week. )   HPI  Patricia Pearson presents to clinic with concerns of dysuria, urinary urgency, and hesitancy with feeling of full bladder and some right sided back pain.  Reports she is in physical therapy for her knee and is unsure if the back pain is related to her urinary symptoms or her physical therapy.  Denies fevers, frequency, hematuria, abdominal pain, n/v/d, chills/shakes.  Depression screen [facility] 2/9 09/16/2020 07/12/2020 04/29/2020  Decreased Interest 0 0 0  Down, Depressed, Hopeless 0 0 0  PHQ - 2 Score 0 0 0  Altered sleeping 0 - 0  Tired, decreased energy 0 - 0  Change in appetite 0 - 0  Feeling bad or failure about yourself  0 - 0  Trouble concentrating 0 - 0  Moving slowly or fidgety/restless 0 - 0  Suicidal thoughts 0 - 0  PHQ-9 Score 0 - 0  Difficult doing work/chores Not difficult at all - Not difficult at all    Social History   Tobacco Use  . Smoking status: Never Smoker  . Smokeless tobacco: Never Used  Vaping Use  . Vaping Use: Never used  Substance Use Topics  . Alcohol use: No  . Drug use: No    Review of Systems  Constitutional: Negative.   HENT: Negative.   Eyes: Negative.   Respiratory: Negative.   Cardiovascular: Negative.   Gastrointestinal: Negative.   Endocrine: Negative.   Genitourinary: Positive for dysuria, flank pain and frequency. Negative for decreased urine volume, difficulty urinating, dyspareunia, enuresis, genital sores, hematuria, menstrual problem, pelvic pain, urgency, vaginal bleeding, vaginal discharge and vaginal pain.       Hesitancy  Skin: Negative.   Allergic/Immunologic: Negative.   Neurological: Negative.   Hematological: Negative.   Psychiatric/Behavioral: Negative.     Per HPI unless specifically indicated above     Objective:    BP 109/69   Pulse 83   Temp 98.5 F (36.9 C) (Oral)   Ht 5\' 3"  (1.6 m)   Wt 165 lb (74.8 kg)   LMP 08/26/2020 (Exact Date)   SpO2 100%   BMI 29.23 kg/m   Wt Readings from Last 3 Encounters:  09/16/20 165 lb (74.8 kg)  07/08/20 165 lb (74.8 kg)  06/24/20 160 lb (72.6 kg)    Physical Exam Vitals and nursing note reviewed.  Constitutional:      General: She is not in acute distress.    Appearance: Normal appearance. She is well-developed and well-groomed. She is not ill-appearing or toxic-appearing.  HENT:     Head: Normocephalic and atraumatic.     Nose:     Comments: Lizbeth Bark is in place, covering mouth and nose. Eyes:     General: Lids are normal. Vision grossly intact.        Right eye: No discharge.        Left eye: No discharge.     Extraocular Movements: Extraocular movements intact.     Conjunctiva/sclera: Conjunctivae normal.     Pupils: Pupils are equal, round, and reactive to light.  Cardiovascular:     Pulses: Normal pulses.  Pulmonary:     Effort: Pulmonary effort is normal. No respiratory distress.  Abdominal:     General: Abdomen is  flat. Bowel sounds are normal. There is no distension.     Palpations: Abdomen is soft.     Tenderness: There is abdominal tenderness in the suprapubic area. There is no right CVA tenderness, left CVA tenderness, guarding or rebound.  Skin:    General: Skin is warm and dry.     Capillary Refill: Capillary refill takes less than 2 seconds.  Neurological:     General: No focal deficit present.     Mental Status: She is alert and oriented to person, place, and time.  Psychiatric:        Attention and Perception: Attention and perception normal.        Mood and Affect: Mood and affect normal.        Speech: Speech normal.        Behavior: Behavior normal. Behavior is cooperative.        Thought Content: Thought content normal.        Cognition and Memory:  Cognition and memory normal.        Judgment: Judgment normal.    Results for orders placed or performed in visit on 06/24/20  CBC with Differential  Result Value Ref Range   WBC 7.9 3.8 - 10.8 Thousand/uL   RBC 4.60 3.80 - 5.10 Million/uL   Hemoglobin 13.7 11.7 - 15.5 g/dL   HCT 41.3 35 - 45 %   MCV 89.8 80.0 - 100.0 fL   MCH 29.8 27.0 - 33.0 pg   MCHC 33.2 32.0 - 36.0 g/dL   RDW 12.4 11.0 - 15.0 %   Platelets 219 140 - 400 Thousand/uL   MPV 11.4 7.5 - 12.5 fL   Neutro Abs 5,159 1,500 - 7,800 cells/uL   Lymphs Abs 1,967 850 - 3,900 cells/uL   Absolute Monocytes 616 200 - 950 cells/uL   Eosinophils Absolute 111 15.0 - 500.0 cells/uL   Basophils Absolute 47 0.0 - 200.0 cells/uL   Neutrophils Relative % 65.3 %   Total Lymphocyte 24.9 %   Monocytes Relative 7.8 %   Eosinophils Relative 1.4 %   Basophils Relative 0.6 %  COMPLETE METABOLIC PANEL WITH GFR  Result Value Ref Range   Glucose, Bld 80 65 - 99 mg/dL   BUN 10 7 - 25 mg/dL   Creat 0.81 0.50 - 1.10 mg/dL   GFR, Est Non African American 104 > OR = 60 mL/min/1.21m2   GFR, Est African American 120 > OR = 60 mL/min/1.12m2   BUN/Creatinine Ratio NOT APPLICABLE 6 - 22 (calc)   Sodium 136 135 - 146 mmol/L   Potassium 3.9 3.5 - 5.3 mmol/L   Chloride 101 98 - 110 mmol/L   CO2 30 20 - 32 mmol/L   Calcium 8.8 8.6 - 10.2 mg/dL   Total Protein 7.1 6.1 - 8.1 g/dL   Albumin 4.3 3.6 - 5.1 g/dL   Globulin 2.8 1.9 - 3.7 g/dL (calc)   AG Ratio 1.5 1.0 - 2.5 (calc)   Total Bilirubin 0.6 0.2 - 1.2 mg/dL   Alkaline phosphatase (APISO) 67 31 - 125 U/L   AST 11 10 - 30 U/L   ALT 14 6 - 29 U/L  H. pylori breath test  Result Value Ref Range   H. pylori Breath Test NOT DETECTED NOT DETECT  POC Hemoccult Bld/Stl (1-Cd Office Dx)  Result Value Ref Range   Card #1 Date 11/2021    Fecal Occult Blood, POC Negative Negative      Assessment & Plan:   Problem  List Items Addressed This Visit      Other   Dysuria - Primary    Sending urine  to the lab for culture based on symptoms.  Will treat empirically with bactrim DS 1 tablet BID x 5 days and pyridium 100mg  TID PRN for dysuria.    Plan: 1. Urine sent to the lab for culture 2. Begin bactrim DS, 1 tablet BID x 5 days 3. Begin pyridium 100mg  TID PRN x 2 days 4. RTC if symptoms worsen or fail to improve      Relevant Medications   phenazopyridine (PYRIDIUM) 100 MG tablet   sulfamethoxazole-trimethoprim (BACTRIM DS) 800-160 MG tablet      Meds ordered this encounter  Medications  . phenazopyridine (PYRIDIUM) 100 MG tablet    Sig: Take 1 tablet (100 mg total) by mouth 3 (three) times daily as needed for up to 2 days for pain.    Dispense:  6 tablet    Refill:  0  . sulfamethoxazole-trimethoprim (BACTRIM DS) 800-160 MG tablet    Sig: Take 1 tablet by mouth 2 (two) times daily for 5 days.    Dispense:  10 tablet    Refill:  0    Follow up plan: Return if symptoms worsen or fail to improve.   Harlin Rain, [place] [facility] Nurse Practitioner [facility] 09/16/2020, 3:33 PM

## 2020-09-16 NOTE — Patient Instructions (Signed)
We have sent your urine to the lab for processing and will contact you when we receive the results.  I have sent in a prescription for bactrim DS, to take 1 tablet 2x per day for the next 5 days.  Remember to always wipe front to back and empty your bladder pre- and post-intercourse to prevent UTIs.    Drink plenty of fluids.  I have sent in a prescription for Pyridium 100mg , to take 1 tablet up to 3x per day as needed for urinary discomfort  As we discussed. this will change the color of your urine to an orange/red, this is normal.    You may also experience some urinary dribbling, be sure to wear a pantyliner to protect your clothing from staining.  We will plan to see you back if your symptoms worsen or fail to improve  You will receive a survey after today's visit either digitally by e-mail or paper by USPS mail. Your experiences and feedback matter to [place].  Please respond so we know how we are doing as we provide care for you.  Call us with any questions/concerns/needs.  It is my goal to be available to you for your health concerns.  Thanks for choosing me to be a partner in your healthcare needs!  Harlin Rain, FNP-C Family Nurse Practitioner [facility] Phone: [phone]

## 2020-09-19 ENCOUNTER — Other Ambulatory Visit: Payer: Self-pay

## 2020-09-19 LAB — URINE CULTURE
MICRO NUMBER:: 11197209
SPECIMEN QUALITY:: ADEQUATE

## 2020-09-19 MED ORDER — METOPROLOL SUCCINATE ER 25 MG PO TB24
12.5000 mg | ORAL_TABLET | Freq: Every day | ORAL | 3 refills | Status: DC
Start: 2020-09-19 — End: 2020-11-18

## 2020-09-21 ENCOUNTER — Telehealth: Payer: Self-pay

## 2020-09-21 NOTE — Telephone Encounter (Signed)
Copied from [place] [id number]. Topic: General - Other >> Sep 21, 2020  1:56 PM Hinda Lenis D wrote: PT is not feeling any better/ UTI / asking to speak with a nurse / she's also asking for a different  medication if possible sulfamethoxazole-trimethoprim (BACTRIM DS) 800-160 MG tablet [[id number]] / please advise   I contacted the patient and she requested to change or extend her antibiotic, because she only notice mild improvement since Monday.  She complains of constant dysuria with worsening  towards the end of her void. She informed me that she started the Bactrim DS on Monday instead of Friday as requested, because it wasn't ready at the pharmacy. Dr. Raliegh Ip verbally recommended that the patient continue on the bactrim DS until Friday. If she doesn't notice any improvement or her symptoms worsen to get back in contact with [place] for further recommendations. She verbalize understanding, no questions or concerns.

## 2020-09-21 NOTE — Telephone Encounter (Signed)
In past she has had treatment failure of Keflex on chart review and intolerance/itching or allergy to it, she has taken variety of other antibiotics including Cipro in past for UTI.  Would consider offering Cipro course and Diflucan on Friday 11/19 if problem is unresolved and she contacts [place].  Nobie Putnam, DO [facility] 09/21/2020, 5:07 PM

## 2020-09-22 DIAGNOSIS — M2202 Recurrent dislocation of patella, left knee: Secondary | ICD-10-CM | POA: Diagnosis not present

## 2020-09-22 DIAGNOSIS — S83015D Lateral dislocation of left patella, subsequent encounter: Secondary | ICD-10-CM | POA: Diagnosis not present

## 2020-09-23 DIAGNOSIS — N3000 Acute cystitis without hematuria: Secondary | ICD-10-CM

## 2020-09-23 DIAGNOSIS — B379 Candidiasis, unspecified: Secondary | ICD-10-CM

## 2020-09-23 MED ORDER — CIPROFLOXACIN HCL 500 MG PO TABS: 500.0000 mg | ORAL_TABLET | Freq: Two times a day (BID) | ORAL | 0 refills | Status: DC

## 2020-09-23 MED ORDER — FLUCONAZOLE 150 MG PO TABS: ORAL_TABLET | ORAL | 0 refills | Status: DC

## 2020-09-23 NOTE — Addendum Note (Signed)
Addended by: Olin Hauser on: 09/23/2020 03:26 PM   Modules accepted: Orders

## 2020-10-07 DIAGNOSIS — F411 Generalized anxiety disorder: Secondary | ICD-10-CM | POA: Diagnosis not present

## 2020-10-13 ENCOUNTER — Other Ambulatory Visit: Payer: BC Managed Care – PPO

## 2020-10-18 DIAGNOSIS — N3000 Acute cystitis without hematuria: Secondary | ICD-10-CM | POA: Diagnosis not present

## 2020-10-18 DIAGNOSIS — N393 Stress incontinence (female) (male): Secondary | ICD-10-CM | POA: Diagnosis not present

## 2020-10-18 DIAGNOSIS — R300 Dysuria: Secondary | ICD-10-CM | POA: Diagnosis not present

## 2020-10-18 DIAGNOSIS — N3010 Interstitial cystitis (chronic) without hematuria: Secondary | ICD-10-CM | POA: Diagnosis not present

## 2020-10-18 DIAGNOSIS — N3281 Overactive bladder: Secondary | ICD-10-CM | POA: Diagnosis not present

## 2020-10-18 DIAGNOSIS — N3941 Urge incontinence: Secondary | ICD-10-CM | POA: Diagnosis not present

## 2020-10-19 ENCOUNTER — Other Ambulatory Visit: Payer: BC Managed Care – PPO

## 2020-10-21 ENCOUNTER — Ambulatory Visit: Admit: 2020-10-21 | Payer: BC Managed Care – PPO | Admitting: Obstetrics and Gynecology

## 2020-10-21 SURGERY — LIGATION, FALLOPIAN TUBE, LAPAROSCOPIC
Anesthesia: General | Laterality: Bilateral

## 2020-10-31 DIAGNOSIS — F411 Generalized anxiety disorder: Secondary | ICD-10-CM | POA: Diagnosis not present

## 2020-11-18 ENCOUNTER — Telehealth (INDEPENDENT_AMBULATORY_CARE_PROVIDER_SITE_OTHER): Payer: 59 | Admitting: Family Medicine

## 2020-11-18 ENCOUNTER — Encounter: Payer: Self-pay | Admitting: Family Medicine

## 2020-11-18 VITALS — Ht 64.0 in | Wt 155.0 lb

## 2020-11-18 DIAGNOSIS — G43809 Other migraine, not intractable, without status migrainosus: Secondary | ICD-10-CM | POA: Diagnosis not present

## 2020-11-18 MED ORDER — SUMATRIPTAN SUCCINATE 50 MG PO TABS: 50.0000 mg | ORAL_TABLET | Freq: Once | ORAL | 2 refills | Status: DC | PRN

## 2020-11-18 NOTE — Progress Notes (Signed)
Virtual Visit via Telephone The purpose of this virtual visit is to provide medical care while limiting exposure to the novel coronavirus (COVID19) for both patient and office staff.  Consent was obtained for phone visit:  Yes.   Answered questions that patient had about telehealth interaction:  Yes.   I discussed the limitations, risks, security and privacy concerns of performing an evaluation and management service by telephone. I also discussed with the patient that there may be a patient responsible charge related to this service. The patient expressed understanding and agreed to proceed.  Patient Location: Home Provider Location: [facility] (Office)  Participants in virtual visit: - Patient: Patricia Pearson - CMA: Burnell Blanks, CMA - Provider: Dr Althea Charon  ---------------------------------------------------------------------- Chief Complaint  Patient presents with  . Headache  . Nausea    S: Reviewed CMA documentation. I have called patient and gathered additional HPI as follows:  Headache vs possible Migraine She had COVID booster on Monday 11/14/20. Developed headache generalized, moderate to severe at times, it has been persistent since Wednesday. Not improving.  Admits eyes tearing at night, when closes eyes. Wakes up with some crust around. Admits nausea associated with headache. She has prior history of headache syndromes. No confirmed migraine in past.  She has not started new Psych med. Viibryd. Also has Buspirone for panic / anxiety.  Denies any high risk travel to areas of current concern for COVID19. Denies any known or suspected exposure to person with or possibly with COVID19.  Denies any fevers, chills, sweats, body ache, cough, shortness of breath, sinus pain or pressure, abdominal pain, diarrhea  Past Medical History:  Diagnosis Date  . Anemia   . Anxiety   . Kidney stone    Social History   Tobacco Use  . Smoking status:  Never Smoker  . Smokeless tobacco: Never Used  Vaping Use  . Vaping Use: Never used  Substance Use Topics  . Alcohol use: No  . Drug use: No    Current Outpatient Medications:  .  busPIRone (BUSPAR) 15 MG tablet, Take 1 tablet by mouth daily., Disp: , Rfl:  .  fluconazole (DIFLUCAN) 150 MG tablet, Take one tablet by mouth on Day 1. Repeat dose 2nd tablet on Day 3., Disp: 2 tablet, Rfl: 0 .  ibuprofen (ADVIL) 600 MG tablet, Take 1 tablet (600 mg total) by mouth every 6 (six) hours as needed for headache, mild pain, moderate pain or cramping., Disp: 60 tablet, Rfl: 0 .  SUMAtriptan (IMITREX) 50 MG tablet, Take 1 tablet (50 mg total) by mouth once as needed for up to 1 dose for migraine. May repeat one dose in 2 hours if headache persists, for max dose 24 hours, Disp: 12 tablet, Rfl: 2 .  UNABLE TO FIND, Med Name: MINI PILL Birth Control Daily, Disp: , Rfl:  .  clonazePAM (KLONOPIN) 0.25 MG disintegrating tablet, Take 0.25 mg by mouth daily., Disp: , Rfl:  .  ferrous sulfate 325 (65 FE) MG EC tablet, Take 325 mg by mouth daily., Disp: , Rfl:   Depression screen [facility] 2/9 09/16/2020 07/12/2020 04/29/2020  Decreased Interest 0 0 0  Down, Depressed, Hopeless 0 0 0  PHQ - 2 Score 0 0 0  Altered sleeping 0 - 0  Tired, decreased energy 0 - 0  Change in appetite 0 - 0  Feeling bad or failure about yourself  0 - 0  Trouble concentrating 0 - 0  Moving slowly or fidgety/restless 0 -  0  Suicidal thoughts 0 - 0  PHQ-9 Score 0 - 0  Difficult doing work/chores Not difficult at all - Not difficult at all    GAD 7 : Generalized Anxiety Score 07/12/2020 08/10/2019 04/05/2017 03/04/2017  Nervous, Anxious, on Edge 2 0 1 2  Control/stop worrying 2 1 1 3   Worry too much - different things 2 1 1 3   Trouble relaxing 1 0 0 0  Restless 0 0 0 0  Easily annoyed or irritable 0 2 0 0  Afraid - awful might happen 1 1 0 0  Total GAD 7 Score 8 5 3 8   Anxiety Difficulty Somewhat difficult Not difficult at all Not  difficult at all -    -------------------------------------------------------------------------- O: No physical exam performed due to remote telephone encounter.  Lab results reviewed.  Recent Results (from the past 2160 hour(s))  Urine Culture     Status: Abnormal   Collection Time: 09/16/20  3:58 PM   Specimen: Urine  Result Value Ref Range   MICRO NUMBER: [id number]    SPECIMEN QUALITY: Adequate    Sample Source NOT GIVEN    STATUS: FINAL    ISOLATE 1: Staphylococcus epidermidis (A)     Comment: Greater than 100,000 CFU/mL of Staphylococcus epidermidis      Susceptibility   Staphylococcus epidermidis - URINE CULTURE POSITIVE 1    VANCOMYCIN 2 Sensitive     CIPROFLOXACIN <=0.5 Sensitive     LEVOFLOXACIN <=0.12 Sensitive     GENTAMICIN <=0.5 Sensitive     NITROFURANTOIN <=16 Sensitive     OXACILLIN* <=0.25 Sensitive      * Oxacillin-susceptible staphylococci aresusceptible to other penicillinase-stablepenicillins (e.g. Methicillin, Nafcillin), beta-lactam/beta-lactamase inhibitor combinations, andcephems with staphylococcal indications, includingCefazolin.    TETRACYCLINE <=1 Sensitive     TRIMETH/SULFA* <=10 Sensitive      * Oxacillin-susceptible staphylococci aresusceptible to other penicillinase-stablepenicillins (e.g. Methicillin, Nafcillin), beta-lactam/beta-lactamase inhibitor combinations, andcephems with staphylococcal indications, includingCefazolin.Legend:S = Susceptible  I = IntermediateR = Resistant  NS = Not susceptible* = Not tested  NR = Not reported**NN = See antimicrobic comments    -------------------------------------------------------------------------- A&P:  Problem List Items Addressed This Visit   None   Visit Diagnoses    Other migraine without status migrainosus, not intractable    -  Primary   Relevant Medications   clonazePAM (KLONOPIN) 0.25 MG disintegrating tablet   SUMAtriptan (IMITREX) 50 MG tablet     Clinically with history suggestive of  migraine headache Acute onset, maybe triggered by covid vaccine Known headache syndromes in past, but not dx migraines Assoc nausea and lacrimation suggestive of migraine  Plan: 1. Start abortive therapy with Sumatriptan 50mg  tabs - take 1 PRN (#12, 0 refill due to quantity limit), severe HA, may repeat dose within 2 hr if persistent, no more in 24 hours, in future can titrate dose to 50-100mg  if needed. Counseling on potential side effect / intolerance with chest discomfort acutely after taking sumatriptan - May try Excedrin Migraine PRN 2. Recommend taking Ibuprofen 600-800mg  q 8 hr PRN, and can try Tylenol 1000mg  TID alternatively 3. Avoid triggers including foods, caffeine. Important to rest. - Discussion on future migraine prophylaxis medications - handout given, review options at next visit, determine need if using sumatriptan frequently  Return criteria given for acute migraine, when to go to office vs ED    Meds ordered this encounter  Medications  . SUMAtriptan (IMITREX) 50 MG tablet    Sig: Take 1 tablet (50 mg total) by mouth once  as needed for up to 1 dose for migraine. May repeat one dose in 2 hours if headache persists, for max dose 24 hours    Dispense:  12 tablet    Refill:  2    Follow-up: - Return in 1-4 weeks if HA not improved  Patient verbalizes understanding with the above medical recommendations including the limitation of remote medical advice.  Specific follow-up and call-back criteria were given for patient to follow-up or seek medical care more urgently if needed.   - Time spent in direct consultation with patient on phone: 12 minutes   Nobie Putnam, [facility] 11/18/2020, 3:44 PM

## 2020-11-24 DIAGNOSIS — H5213 Myopia, bilateral: Secondary | ICD-10-CM | POA: Diagnosis not present

## 2020-11-24 DIAGNOSIS — H52223 Regular astigmatism, bilateral: Secondary | ICD-10-CM | POA: Diagnosis not present

## 2020-11-24 DIAGNOSIS — D3132 Benign neoplasm of left choroid: Secondary | ICD-10-CM | POA: Diagnosis not present

## 2020-12-16 DIAGNOSIS — F410 Panic disorder [episodic paroxysmal anxiety] without agoraphobia: Secondary | ICD-10-CM | POA: Diagnosis not present

## 2020-12-16 DIAGNOSIS — F411 Generalized anxiety disorder: Secondary | ICD-10-CM | POA: Diagnosis not present

## 2020-12-23 DIAGNOSIS — B373 Candidiasis of vulva and vagina: Secondary | ICD-10-CM | POA: Diagnosis not present

## 2020-12-23 DIAGNOSIS — N898 Other specified noninflammatory disorders of vagina: Secondary | ICD-10-CM | POA: Diagnosis not present

## 2021-01-02 DIAGNOSIS — E538 Deficiency of other specified B group vitamins: Secondary | ICD-10-CM | POA: Diagnosis not present

## 2021-01-02 DIAGNOSIS — B999 Unspecified infectious disease: Secondary | ICD-10-CM | POA: Diagnosis not present

## 2021-01-02 DIAGNOSIS — D649 Anemia, unspecified: Secondary | ICD-10-CM | POA: Diagnosis not present

## 2021-01-02 DIAGNOSIS — J328 Other chronic sinusitis: Secondary | ICD-10-CM | POA: Diagnosis not present

## 2021-01-03 DIAGNOSIS — B999 Unspecified infectious disease: Secondary | ICD-10-CM | POA: Diagnosis not present

## 2021-01-13 DIAGNOSIS — F411 Generalized anxiety disorder: Secondary | ICD-10-CM | POA: Diagnosis not present

## 2021-01-13 DIAGNOSIS — F410 Panic disorder [episodic paroxysmal anxiety] without agoraphobia: Secondary | ICD-10-CM | POA: Diagnosis not present

## 2021-01-20 DIAGNOSIS — Z3200 Encounter for pregnancy test, result unknown: Secondary | ICD-10-CM | POA: Diagnosis not present

## 2021-01-20 DIAGNOSIS — B373 Candidiasis of vulva and vagina: Secondary | ICD-10-CM | POA: Diagnosis not present

## 2021-01-20 DIAGNOSIS — Z3009 Encounter for other general counseling and advice on contraception: Secondary | ICD-10-CM | POA: Diagnosis not present

## 2021-01-20 DIAGNOSIS — N898 Other specified noninflammatory disorders of vagina: Secondary | ICD-10-CM | POA: Diagnosis not present

## 2021-02-17 DIAGNOSIS — F410 Panic disorder [episodic paroxysmal anxiety] without agoraphobia: Secondary | ICD-10-CM | POA: Diagnosis not present

## 2021-02-17 DIAGNOSIS — F411 Generalized anxiety disorder: Secondary | ICD-10-CM | POA: Diagnosis not present

## 2021-02-21 DIAGNOSIS — L501 Idiopathic urticaria: Secondary | ICD-10-CM | POA: Diagnosis not present

## 2021-02-21 DIAGNOSIS — L509 Urticaria, unspecified: Secondary | ICD-10-CM | POA: Diagnosis not present

## 2021-02-21 DIAGNOSIS — B999 Unspecified infectious disease: Secondary | ICD-10-CM | POA: Diagnosis not present

## 2021-02-27 DIAGNOSIS — Z3200 Encounter for pregnancy test, result unknown: Secondary | ICD-10-CM | POA: Diagnosis not present

## 2021-02-27 DIAGNOSIS — K602 Anal fissure, unspecified: Secondary | ICD-10-CM | POA: Diagnosis not present

## 2021-02-27 DIAGNOSIS — Z30430 Encounter for insertion of intrauterine contraceptive device: Secondary | ICD-10-CM | POA: Diagnosis not present

## 2021-04-07 DIAGNOSIS — B373 Candidiasis of vulva and vagina: Secondary | ICD-10-CM | POA: Diagnosis not present

## 2021-04-07 DIAGNOSIS — N898 Other specified noninflammatory disorders of vagina: Secondary | ICD-10-CM | POA: Diagnosis not present

## 2021-04-07 DIAGNOSIS — Z30431 Encounter for routine checking of intrauterine contraceptive device: Secondary | ICD-10-CM | POA: Diagnosis not present

## 2021-04-25 ENCOUNTER — Other Ambulatory Visit (HOSPITAL_COMMUNITY): Payer: Self-pay | Admitting: Otolaryngology

## 2021-04-25 DIAGNOSIS — K115 Sialolithiasis: Secondary | ICD-10-CM

## 2021-05-02 DIAGNOSIS — L659 Nonscarring hair loss, unspecified: Secondary | ICD-10-CM | POA: Diagnosis not present

## 2021-05-03 DIAGNOSIS — J019 Acute sinusitis, unspecified: Secondary | ICD-10-CM | POA: Diagnosis not present

## 2021-05-03 DIAGNOSIS — Z03818 Encounter for observation for suspected exposure to other biological agents ruled out: Secondary | ICD-10-CM | POA: Diagnosis not present

## 2021-05-03 DIAGNOSIS — J209 Acute bronchitis, unspecified: Secondary | ICD-10-CM | POA: Diagnosis not present

## 2021-05-03 DIAGNOSIS — J029 Acute pharyngitis, unspecified: Secondary | ICD-10-CM | POA: Diagnosis not present

## 2021-05-03 DIAGNOSIS — Z20822 Contact with and (suspected) exposure to covid-19: Secondary | ICD-10-CM | POA: Diagnosis not present

## 2021-05-09 ENCOUNTER — Ambulatory Visit: Admission: RE | Admit: 2021-05-09 | Payer: BC Managed Care – PPO | Source: Ambulatory Visit

## 2021-05-09 DIAGNOSIS — Z711 Person with feared health complaint in whom no diagnosis is made: Secondary | ICD-10-CM | POA: Diagnosis not present

## 2021-05-10 DIAGNOSIS — F410 Panic disorder [episodic paroxysmal anxiety] without agoraphobia: Secondary | ICD-10-CM | POA: Diagnosis not present

## 2021-05-10 DIAGNOSIS — F411 Generalized anxiety disorder: Secondary | ICD-10-CM | POA: Diagnosis not present

## 2021-05-23 ENCOUNTER — Ambulatory Visit
Admission: RE | Admit: 2021-05-23 | Discharge: 2021-05-23 | Disposition: A | Discharge status: Home / Self Care | Payer: BC Managed Care – PPO | Source: Ambulatory Visit | Attending: Otolaryngology | Admitting: Otolaryngology

## 2021-05-23 ENCOUNTER — Other Ambulatory Visit: Payer: Self-pay

## 2021-05-23 DIAGNOSIS — K115 Sialolithiasis: Secondary | ICD-10-CM

## 2021-05-23 DIAGNOSIS — J387 Other diseases of larynx: Secondary | ICD-10-CM | POA: Diagnosis not present

## 2021-06-17 IMAGING — DX PORTABLE CHEST - 1 VIEW
1 series · 1 of 1 positions shown · non-contrast
Comparison: None.

CLINICAL DATA: Fever today. Recent postpartum post vaginal
delivery.

EXAM:
PORTABLE CHEST 1 VIEW

[chest ap]
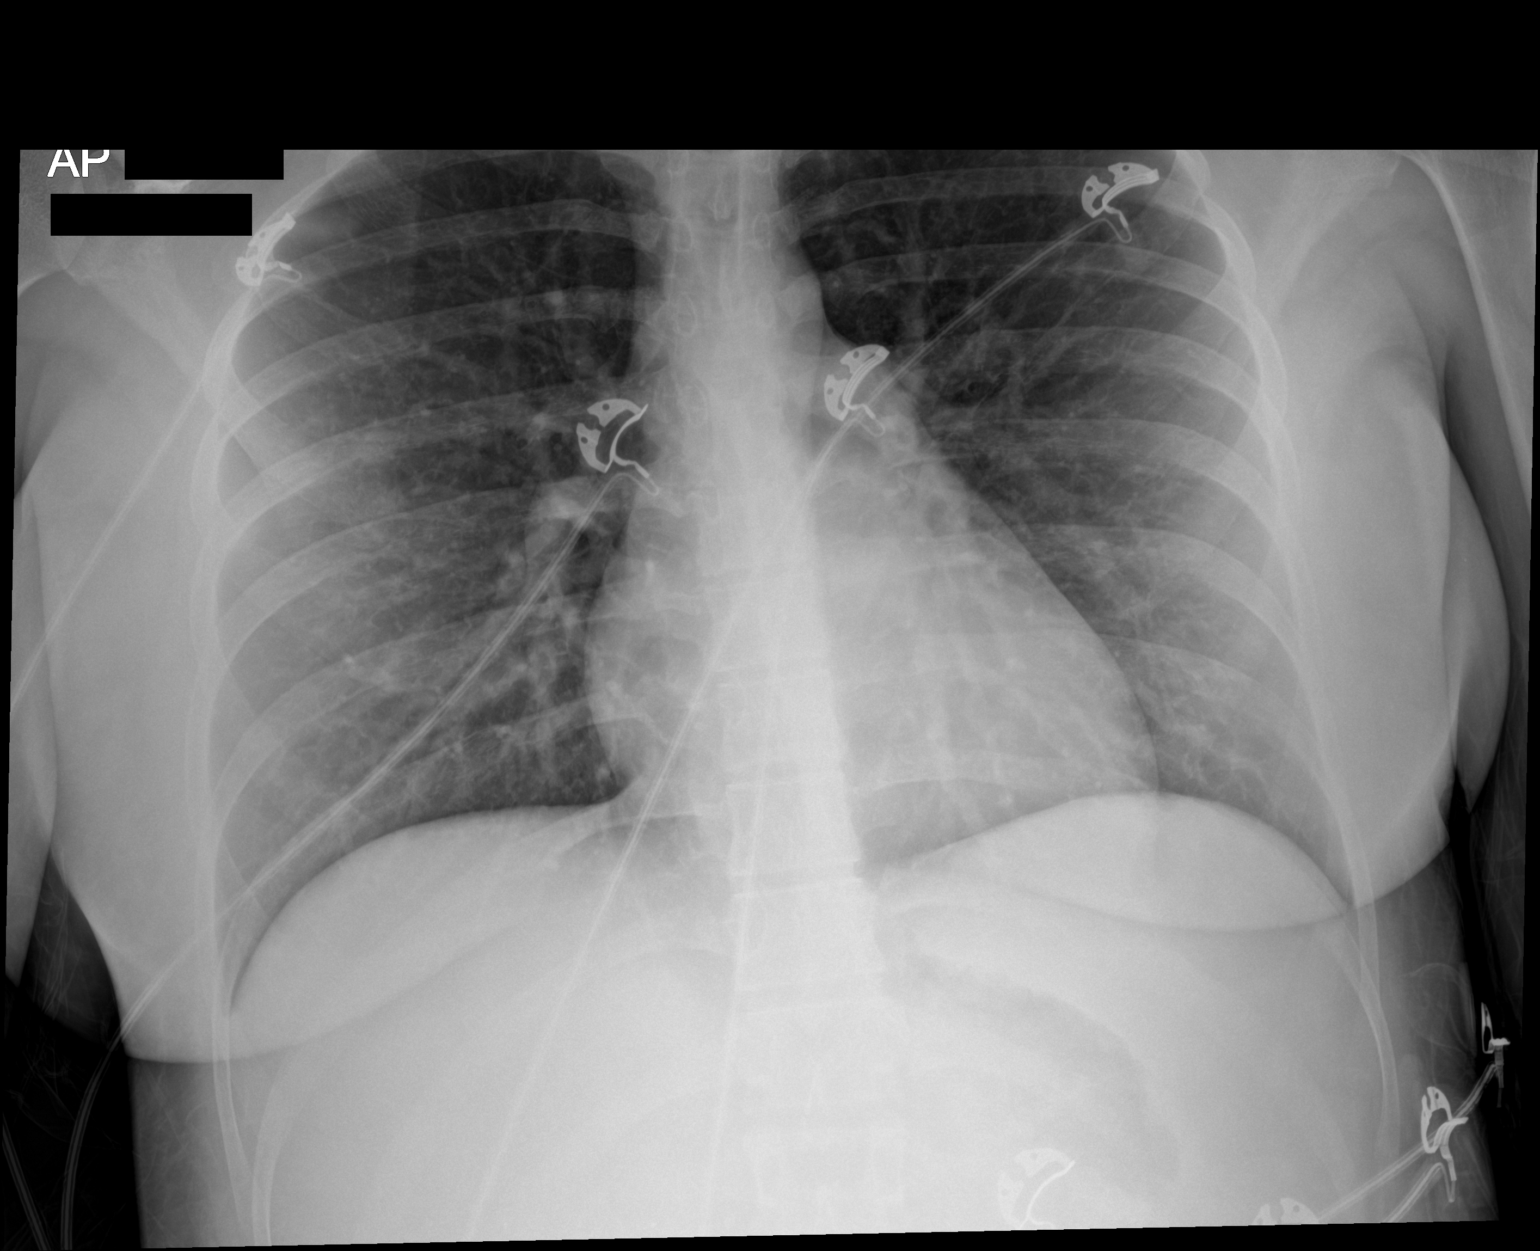

[1 of 1 positions shown; findings below may reference images not displayed]

FINDINGS: The cardiomediastinal contours are normal. Scattered subsegmental
atelectasis. No confluent airspace disease. Pulmonary vasculature is
normal. No large pleural effusion or pneumothorax. No acute osseous
abnormalities are seen.
IMPRESSION: Scattered subsegmental atelectasis.

## 2021-06-18 DIAGNOSIS — K921 Melena: Secondary | ICD-10-CM | POA: Diagnosis not present

## 2021-06-18 DIAGNOSIS — R1030 Lower abdominal pain, unspecified: Secondary | ICD-10-CM | POA: Diagnosis not present

## 2021-06-18 DIAGNOSIS — F419 Anxiety disorder, unspecified: Secondary | ICD-10-CM | POA: Diagnosis not present

## 2021-06-19 ENCOUNTER — Encounter: Payer: Self-pay | Admitting: Family Medicine

## 2021-06-19 ENCOUNTER — Telehealth: Payer: Self-pay | Admitting: Family Medicine

## 2021-06-19 DIAGNOSIS — R1030 Lower abdominal pain, unspecified: Secondary | ICD-10-CM

## 2021-06-19 DIAGNOSIS — K625 Hemorrhage of anus and rectum: Secondary | ICD-10-CM

## 2021-06-19 NOTE — Telephone Encounter (Signed)
Called and left a message for her to call back to let us know what she wants to proceed with about GI.

## 2021-06-19 NOTE — Telephone Encounter (Signed)
Reviewed ED visit.  She has a few options. If she wants to schedule with me whenever ready to follow-up on ED that is fine.  If she prefers just go to straight to GI, she can let me know where she prefers to go:  [facility] or [facility] through [facility] in [place]  Or other location.  They would likely evaluate her first before scheduling any procedure.  Nobie Putnam, [facility] 06/19/2021, 9:16 AM

## 2021-06-19 NOTE — Telephone Encounter (Signed)
Patient was seen yesterday at ED in [place]. Patient had blood in stool and abdominal pain and was advise to contact pcp to order colonoscopy. Patient would like to know if order can be placed or does she need office visit.

## 2021-06-27 DIAGNOSIS — Z6827 Body mass index (BMI) 27.0-27.9, adult: Secondary | ICD-10-CM | POA: Diagnosis not present

## 2021-06-27 DIAGNOSIS — B999 Unspecified infectious disease: Secondary | ICD-10-CM | POA: Diagnosis not present

## 2021-06-27 DIAGNOSIS — R7989 Other specified abnormal findings of blood chemistry: Secondary | ICD-10-CM | POA: Diagnosis not present

## 2021-06-27 DIAGNOSIS — L501 Idiopathic urticaria: Secondary | ICD-10-CM | POA: Diagnosis not present

## 2021-07-11 DIAGNOSIS — D3132 Benign neoplasm of left choroid: Secondary | ICD-10-CM | POA: Diagnosis not present

## 2021-07-11 DIAGNOSIS — L509 Urticaria, unspecified: Secondary | ICD-10-CM | POA: Diagnosis not present

## 2021-07-11 DIAGNOSIS — Z8669 Personal history of other diseases of the nervous system and sense organs: Secondary | ICD-10-CM | POA: Diagnosis not present

## 2021-07-11 DIAGNOSIS — Z79899 Other long term (current) drug therapy: Secondary | ICD-10-CM | POA: Diagnosis not present

## 2021-07-12 DIAGNOSIS — F410 Panic disorder [episodic paroxysmal anxiety] without agoraphobia: Secondary | ICD-10-CM | POA: Diagnosis not present

## 2021-07-12 DIAGNOSIS — F411 Generalized anxiety disorder: Secondary | ICD-10-CM | POA: Diagnosis not present

## 2021-07-18 ENCOUNTER — Other Ambulatory Visit: Payer: Self-pay | Admitting: Gastroenterology

## 2021-07-18 DIAGNOSIS — R110 Nausea: Secondary | ICD-10-CM | POA: Diagnosis not present

## 2021-07-18 DIAGNOSIS — K921 Melena: Secondary | ICD-10-CM | POA: Diagnosis not present

## 2021-07-18 DIAGNOSIS — R1032 Left lower quadrant pain: Secondary | ICD-10-CM | POA: Diagnosis not present

## 2021-07-18 DIAGNOSIS — N83209 Unspecified ovarian cyst, unspecified side: Secondary | ICD-10-CM | POA: Diagnosis not present

## 2021-08-01 ENCOUNTER — Ambulatory Visit
Admission: RE | Admit: 2021-08-01 | Discharge: 2021-08-01 | Disposition: A | Discharge status: Home / Self Care | Payer: BC Managed Care – PPO | Source: Ambulatory Visit | Attending: Gastroenterology | Admitting: Gastroenterology

## 2021-08-01 ENCOUNTER — Other Ambulatory Visit: Payer: Self-pay

## 2021-08-01 DIAGNOSIS — R109 Unspecified abdominal pain: Secondary | ICD-10-CM | POA: Diagnosis not present

## 2021-08-01 DIAGNOSIS — R1032 Left lower quadrant pain: Secondary | ICD-10-CM | POA: Insufficient documentation

## 2021-08-01 MED ORDER — IOHEXOL 300 MG/ML  SOLN: 150.0000 mL | Freq: Once | INTRAMUSCULAR | Status: DC | PRN

## 2021-08-01 MED ORDER — IOHEXOL 350 MG/ML SOLN
150.0000 mL | Freq: Once | INTRAVENOUS | Status: AC | PRN
  Administered 2021-08-01: 80 mL via INTRAVENOUS

## 2021-08-02 DIAGNOSIS — E200 Idiopathic hypoparathyroidism: Secondary | ICD-10-CM | POA: Diagnosis not present

## 2021-08-02 DIAGNOSIS — E559 Vitamin D deficiency, unspecified: Secondary | ICD-10-CM | POA: Diagnosis not present

## 2021-08-07 DIAGNOSIS — F410 Panic disorder [episodic paroxysmal anxiety] without agoraphobia: Secondary | ICD-10-CM | POA: Diagnosis not present

## 2021-08-07 DIAGNOSIS — F411 Generalized anxiety disorder: Secondary | ICD-10-CM | POA: Diagnosis not present

## 2021-08-07 DIAGNOSIS — F320 Major depressive disorder, single episode, mild: Secondary | ICD-10-CM | POA: Diagnosis not present

## 2021-08-08 ENCOUNTER — Ambulatory Visit: Payer: 59 | Admitting: Family Medicine

## 2021-08-15 ENCOUNTER — Ambulatory Visit: Payer: BC Managed Care – PPO | Admitting: Family Medicine

## 2021-08-15 ENCOUNTER — Other Ambulatory Visit: Payer: Self-pay

## 2021-08-15 ENCOUNTER — Encounter: Payer: Self-pay | Admitting: Family Medicine

## 2021-08-15 VITALS — BP 111/79 | HR 94 | Ht 64.0 in | Wt 152.0 lb

## 2021-08-15 DIAGNOSIS — R350 Frequency of micturition: Secondary | ICD-10-CM | POA: Diagnosis not present

## 2021-08-15 LAB — POCT URINALYSIS DIPSTICK
Bilirubin, UA: NEGATIVE
Glucose, UA: NEGATIVE
Nitrite, UA: NEGATIVE
Protein, UA: NEGATIVE
Spec Grav, UA: 1.01 (ref 1.010–1.025)
Urobilinogen, UA: 0.2 E.U./dL
pH, UA: 8 (ref 5.0–8.0)

## 2021-08-15 NOTE — Progress Notes (Signed)
Subjective:    Patient ID: Patricia Pearson, female    DOB: 17-Jan-1996, 25 y.o.   MRN: [id number]  Patricia Pearson is a 25 y.o. female presenting on 08/15/2021 for Urinary Frequency   HPI  Dysuria Urinary Frequency Reports onset dysuria 3 weeks ago with some urinary frequency. Also some burning after post-void 1 hour. Notable improvement about 10 days ago, has had reduced symptoms and has resolved. Today doing well, no new concern, not interested in medication. Used to be on antibiotic prophylaxis from Urology in past. Cannot recall name. Denies hematuria, odor or color change, nausea vomiting     Depression screen [facility] 2/9 08/15/2021 09/16/2020 07/12/2020  Decreased Interest 2 0 0  Down, Depressed, Hopeless 1 0 0  PHQ - 2 Score 3 0 0  Altered sleeping 0 0 -  Tired, decreased energy 3 0 -  Change in appetite 0 0 -  Feeling bad or failure about yourself  0 0 -  Trouble concentrating 0 0 -  Moving slowly or fidgety/restless 0 0 -  Suicidal thoughts 0 0 -  PHQ-9 Score 6 0 -  Difficult doing work/chores Somewhat difficult Not difficult at all -    Social History   Tobacco Use   Smoking status: Never   Smokeless tobacco: Never  Vaping Use   Vaping Use: Never used  Substance Use Topics   Alcohol use: No   Drug use: No    Review of Systems Per HPI unless specifically indicated above     Objective:    BP 111/79   Pulse 94   Ht 5\' 4"  (1.626 m)   Wt 152 lb (68.9 kg)   LMP 08/01/2021   SpO2 100%   BMI 26.09 kg/m   Wt Readings from Last 3 Encounters:  08/15/21 152 lb (68.9 kg)  11/18/20 155 lb (70.3 kg)  09/16/20 165 lb (74.8 kg)    Physical Exam Vitals and nursing note reviewed.  Constitutional:      General: She is not in acute distress.    Appearance: Normal appearance. She is well-developed. She is not diaphoretic.     Comments: Well-appearing, comfortable, cooperative  HENT:     Head: Normocephalic and atraumatic.  Eyes:     General:        Right  eye: No discharge.        Left eye: No discharge.     Conjunctiva/sclera: Conjunctivae normal.  Cardiovascular:     Rate and Rhythm: Normal rate.  Pulmonary:     Effort: Pulmonary effort is normal.  Skin:    General: Skin is warm and dry.     Findings: No erythema or rash.  Neurological:     Mental Status: She is alert and oriented to person, place, and time.  Psychiatric:        Mood and Affect: Mood normal.        Behavior: Behavior normal.        Thought Content: Thought content normal.     Comments: Well groomed, good eye contact, normal speech and thoughts      Results for orders placed or performed in visit on 08/15/21  POCT Urinalysis Dipstick  Result Value Ref Range   Color, UA Yellow    Clarity, UA Clear    Glucose, UA Negative Negative   Bilirubin, UA Negative    Ketones, UA Small    Spec Grav, UA 1.010 1.010 - 1.025   Blood, UA Trace    pH,  UA 8.0 5.0 - 8.0   Protein, UA Negative Negative   Urobilinogen, UA 0.2 0.2 or 1.0 E.U./dL   Nitrite, UA Negative    Leukocytes, UA Trace (A) Negative   Appearance     Odor        Assessment & Plan:   Problem List Items Addressed This Visit   None Visit Diagnoses     Urinary frequency    -  Primary   Relevant Orders   POCT Urinalysis Dipstick (Completed)       Urinary frequency without UTI at this time - RESOLVED UA dipstick mild leuk and rbc but no clinical symptoms at this time. Has had known history of recurrent UTI in past, used to be on prophylaxis period of time Will defer for now, advised about D-Mannose option supplement OTC We can offer antibiotic if indicated within next 1-4 weeks if she calls or messages back and has return of symptoms since just saw her today, but if longer than a month approx would request her to come back for repeat urine sample.  No orders of the defined types were placed in this encounter.     Follow up plan: Return if symptoms worsen or fail to improve.   Nobie Putnam, DO [facility] 08/15/2021, 10:07 AM

## 2021-08-15 NOTE — Patient Instructions (Addendum)
Thank you for coming to the office today.  We tested the Urine today urine dipstick only mild trace signs of leukocytes and trace blood. No significant concern for UTI and no culture needed.  If return of symptoms can message me within 1 month and we can order antibiotic option / anti fungal if need.  Consider:  D-Mannose is a natural supplement that can actually help bind to urinary bacteria and reduce their effectiveness it can help prevent UTI from forming, and may reduce some symptoms. It likely cannot cure an active UTI but it is worth a try and good to prevent them with. Try 500mg  twice a day at a full dose if you want, or check package instructions for more info   Please schedule a Follow-up Appointment to: Return if symptoms worsen or fail to improve.  If you have any other questions or concerns, please feel free to call the office or send a message through [facility]. You may also schedule an earlier appointment if necessary.  Additionally, you may be receiving a survey about your experience at our office within a few days to 1 week by e-mail or mail. We value your feedback.  Patricia Putnam, DO [place]

## 2021-08-16 ENCOUNTER — Encounter: Payer: Self-pay | Admitting: Family Medicine

## 2021-08-16 DIAGNOSIS — B379 Candidiasis, unspecified: Secondary | ICD-10-CM

## 2021-08-16 DIAGNOSIS — N3000 Acute cystitis without hematuria: Secondary | ICD-10-CM

## 2021-08-16 DIAGNOSIS — T3695XA Adverse effect of unspecified systemic antibiotic, initial encounter: Secondary | ICD-10-CM

## 2021-08-16 MED ORDER — FLUCONAZOLE 150 MG PO TABS: ORAL_TABLET | ORAL | 0 refills | Status: DC

## 2021-08-16 MED ORDER — CIPROFLOXACIN HCL 500 MG PO TABS: 500.0000 mg | ORAL_TABLET | Freq: Two times a day (BID) | ORAL | 0 refills | Status: DC

## 2021-09-02 DIAGNOSIS — Z6827 Body mass index (BMI) 27.0-27.9, adult: Secondary | ICD-10-CM | POA: Diagnosis not present

## 2021-09-02 DIAGNOSIS — R112 Nausea with vomiting, unspecified: Secondary | ICD-10-CM | POA: Diagnosis not present

## 2021-09-02 DIAGNOSIS — R197 Diarrhea, unspecified: Secondary | ICD-10-CM | POA: Diagnosis not present

## 2021-09-11 DIAGNOSIS — F411 Generalized anxiety disorder: Secondary | ICD-10-CM | POA: Diagnosis not present

## 2021-09-11 DIAGNOSIS — F320 Major depressive disorder, single episode, mild: Secondary | ICD-10-CM | POA: Diagnosis not present

## 2021-09-11 DIAGNOSIS — F410 Panic disorder [episodic paroxysmal anxiety] without agoraphobia: Secondary | ICD-10-CM | POA: Diagnosis not present

## 2021-12-04 ENCOUNTER — Other Ambulatory Visit: Payer: Self-pay | Admitting: Gastroenterology

## 2021-12-04 DIAGNOSIS — K921 Melena: Secondary | ICD-10-CM

## 2021-12-05 ENCOUNTER — Other Ambulatory Visit: Payer: Self-pay

## 2021-12-05 ENCOUNTER — Ambulatory Visit
Admission: RE | Admit: 2021-12-05 | Discharge: 2021-12-05 | Disposition: A | Discharge status: Home / Self Care | Payer: BC Managed Care – PPO | Source: Ambulatory Visit | Attending: Gastroenterology | Admitting: Gastroenterology

## 2021-12-05 DIAGNOSIS — K760 Fatty (change of) liver, not elsewhere classified: Secondary | ICD-10-CM | POA: Diagnosis not present

## 2021-12-05 DIAGNOSIS — K921 Melena: Secondary | ICD-10-CM | POA: Diagnosis not present

## 2021-12-05 MED ORDER — IOHEXOL 300 MG/ML  SOLN
85.0000 mL | Freq: Once | INTRAMUSCULAR | Status: AC | PRN
  Administered 2021-12-05: 85 mL via INTRAVENOUS

## 2021-12-06 ENCOUNTER — Encounter: Payer: Self-pay | Admitting: Gastroenterology

## 2021-12-07 ENCOUNTER — Encounter: Payer: Self-pay | Admitting: Gastroenterology

## 2021-12-07 ENCOUNTER — Ambulatory Visit: Payer: BC Managed Care – PPO | Admitting: Anesthesiology

## 2021-12-07 ENCOUNTER — Ambulatory Visit
Admission: RE | Admit: 2021-12-07 | Discharge: 2021-12-07 | Disposition: A | Discharge status: Home / Self Care | Payer: BC Managed Care – PPO | Attending: Gastroenterology | Admitting: Gastroenterology

## 2021-12-07 ENCOUNTER — Encounter
Admission: RE | Disposition: A | Discharge status: Home / Self Care | Payer: Self-pay | Source: Home / Self Care | Attending: Gastroenterology

## 2021-12-07 DIAGNOSIS — Z79899 Other long term (current) drug therapy: Secondary | ICD-10-CM | POA: Insufficient documentation

## 2021-12-07 DIAGNOSIS — K921 Melena: Secondary | ICD-10-CM | POA: Diagnosis not present

## 2021-12-07 DIAGNOSIS — K635 Polyp of colon: Secondary | ICD-10-CM | POA: Diagnosis not present

## 2021-12-07 DIAGNOSIS — D126 Benign neoplasm of colon, unspecified: Secondary | ICD-10-CM | POA: Diagnosis not present

## 2021-12-07 DIAGNOSIS — F419 Anxiety disorder, unspecified: Secondary | ICD-10-CM | POA: Diagnosis not present

## 2021-12-07 DIAGNOSIS — D125 Benign neoplasm of sigmoid colon: Secondary | ICD-10-CM | POA: Diagnosis not present

## 2021-12-07 HISTORY — PX: COLONOSCOPY: SHX5424

## 2021-12-07 HISTORY — DX: Unspecified ovarian cyst, right side: N83.201

## 2021-12-07 HISTORY — DX: Personal history of urinary calculi: Z87.442

## 2021-12-07 LAB — POCT PREGNANCY, URINE: Preg Test, Ur: NEGATIVE

## 2021-12-07 SURGERY — COLONOSCOPY
Anesthesia: General

## 2021-12-07 MED ORDER — PROPOFOL 10 MG/ML IV BOLUS
INTRAVENOUS | Status: AC
  Filled 2021-12-07: qty 20

## 2021-12-07 MED ORDER — MIDAZOLAM HCL 2 MG/2ML IJ SOLN
INTRAMUSCULAR | Status: DC | PRN
  Administered 2021-12-07: 1 mg via INTRAVENOUS

## 2021-12-07 MED ORDER — PROPOFOL 500 MG/50ML IV EMUL
INTRAVENOUS | Status: DC | PRN
  Administered 2021-12-07: 150 ug/kg/min via INTRAVENOUS

## 2021-12-07 MED ORDER — MIDAZOLAM HCL 2 MG/2ML IJ SOLN
INTRAMUSCULAR | Status: AC
  Filled 2021-12-07: qty 2

## 2021-12-07 MED ORDER — PROPOFOL 500 MG/50ML IV EMUL
INTRAVENOUS | Status: AC
  Filled 2021-12-07: qty 50

## 2021-12-07 MED ORDER — SODIUM CHLORIDE 0.9 % IV SOLN
INTRAVENOUS | Status: DC
  Administered 2021-12-07: 1000 mL via INTRAVENOUS

## 2021-12-07 NOTE — Anesthesia Preprocedure Evaluation (Signed)
Anesthesia Evaluation  Patient identified by MRN, date of birth, ID band Patient awake    Reviewed: Allergy & Precautions, NPO status , Patient's Chart, lab work & pertinent test results  Airway Mallampati: II  TM Distance: >3 FB Neck ROM: Full    Dental no notable dental hx.    Pulmonary Current Smoker (Vapes) and Patient abstained from smoking.,    Pulmonary exam normal        Cardiovascular hypertension (Pre-eclampsia No meds), Normal cardiovascular exam     Neuro/Psych  Headaches, PSYCHIATRIC DISORDERS Anxiety Depression    GI/Hepatic negative GI ROS, Neg liver ROS, Bowel prep,  Endo/Other  negative endocrine ROS  Renal/GU negative Renal ROS  negative genitourinary   Musculoskeletal negative musculoskeletal ROS (+)   Abdominal   Peds negative pediatric ROS (+)  Hematology negative hematology ROS (+) anemia ,   Anesthesia Other Findings Anemia    Anxiety    Bilateral ovarian cysts    History of kidney stones       Reproductive/Obstetrics negative OB ROS                            Anesthesia Physical Anesthesia Plan  ASA: 2  Anesthesia Plan: General   Post-op Pain Management:    Induction: Intravenous  PONV Risk Score and Plan: 2 and Propofol infusion and TIVA  Airway Management Planned: Natural Airway and Nasal Cannula  Additional Equipment:   Intra-op Plan:   Post-operative Plan:   Informed Consent: I have reviewed the patients History and Physical, chart, labs and discussed the procedure including the risks, benefits and alternatives for the proposed anesthesia with the patient or authorized representative who has indicated his/her understanding and acceptance.       Plan Discussed with: CRNA, Anesthesiologist and Surgeon  Anesthesia Plan Comments:         Anesthesia Quick Evaluation

## 2021-12-07 NOTE — Interval H&P Note (Signed)
History and Physical Interval Note: Preprocedure H&P from 12/07/21  was reviewed and there was no interval change after seeing and examining the patient.  Written consent was obtained from the patient after discussion of risks, benefits, and alternatives. Patient has consented to proceed with Colonoscopy with possible intervention   12/07/2021 11:33 AM  Patricia Pearson  has presented today for surgery, with the diagnosis of Hematochezia (K92.1).  The various methods of treatment have been discussed with the patient and family. After consideration of risks, benefits and other options for treatment, the patient has consented to  Procedure(s): COLONOSCOPY (N/A) as a surgical intervention.  The patient's history has been reviewed, patient examined, no change in status, stable for surgery.  I have reviewed the patient's chart and labs.  Questions were answered to the patient's satisfaction.     Annamaria Helling

## 2021-12-07 NOTE — Anesthesia Procedure Notes (Signed)
Date/Time: 12/07/2021 11:45 AM Performed by: Vaughan Sine Pre-anesthesia Checklist: Patient identified, Emergency Drugs available, Suction available, Patient being monitored and Timeout performed Patient Re-evaluated:Patient Re-evaluated prior to induction Oxygen Delivery Method: Nasal cannula Preoxygenation: Pre-oxygenation with 100% oxygen Induction Type: IV induction Placement Confirmation: positive ETCO2 and CO2 detector

## 2021-12-07 NOTE — Anesthesia Postprocedure Evaluation (Signed)
Anesthesia Post Note  Patient: Patricia Pearson  Procedure(s) Performed: COLONOSCOPY  Patient location during evaluation: PACU Anesthesia Type: General Level of consciousness: awake and alert, awake and oriented Pain management: pain level controlled Vital Signs Assessment: post-procedure vital signs reviewed and stable Respiratory status: spontaneous breathing, nonlabored ventilation and respiratory function stable Cardiovascular status: blood pressure returned to baseline and stable Postop Assessment: no apparent nausea or vomiting Anesthetic complications: no   No notable events documented.   Last Vitals:  Vitals:   12/07/21 1029 12/07/21 1215  BP: 109/80 110/82  Pulse: 81   Resp: 16   Temp: (!) 36.3 C (!) 35.6 C  SpO2: 100%     Last Pain:  Vitals:   12/07/21 1225  TempSrc:   PainSc: 0-No pain                 Phill Mutter

## 2021-12-07 NOTE — Transfer of Care (Signed)
Immediate Anesthesia Transfer of Care Note  Patient: Patricia Pearson  Procedure(s) Performed: COLONOSCOPY  Patient Location: PACU  Anesthesia Type:General  Level of Consciousness: awake and sedated  Airway & Oxygen Therapy: Patient Spontanous Breathing and Patient connected to nasal cannula oxygen  Post-op Assessment: Report given to RN and Post -op Vital signs reviewed and stable  Post vital signs: Reviewed and stable  Last Vitals:  Vitals Value Taken Time  BP    Temp    Pulse    Resp    SpO2      Last Pain:  Vitals:   12/07/21 1029  TempSrc: Temporal  PainSc: 0-No pain         Complications: No notable events documented.

## 2021-12-07 NOTE — H&P (Signed)
Pre-Procedure H&P   Patient ID: Patricia Pearson is a 26 y.o. female.  Gastroenterology Provider: Annamaria Helling, DO  PCP: Olin Hauser, DO  Date: 12/07/2021  HPI Ms. Patricia Pearson is a 26 y.o. female who presents today for Colonoscopy for hematochezia.  Patient seen in office with worsening hematochezia. Trialed anusol suppositories with some improvement. CTE demonstrating intussusception of the jejunum. Otherwise unremarkable.  No melena or UGI sx. No diarrhea, constipation, rectal or abdominal pain currently. Endoscopy naive. No fhx crc polyps or ibd.    Past Medical History:  Diagnosis Date   Anemia    Anxiety    Bilateral ovarian cysts    History of kidney stones     Past Surgical History:  Procedure Laterality Date   KNEE SURGERY Left    STENT PLACEMENT RT URETER ([facility] HX) Right 04/09/2013   WISDOM TOOTH EXTRACTION  2016    Family History No h/o GI disease or malignancy  Review of Systems  Constitutional:  Negative for activity change, appetite change, chills, diaphoresis, fatigue, fever and unexpected weight change.  HENT:  Negative for trouble swallowing and voice change.   Respiratory:  Negative for shortness of breath and wheezing.   Cardiovascular:  Negative for chest pain, palpitations and leg swelling.  Gastrointestinal:  Positive for abdominal pain and blood in stool. Negative for abdominal distention, anal bleeding, constipation, diarrhea, nausea, rectal pain and vomiting.  Genitourinary:  Positive for dysuria.  Musculoskeletal:  Negative for arthralgias and myalgias.  Skin:  Negative for color change and pallor.  Neurological:  Negative for dizziness, syncope and weakness.  Psychiatric/Behavioral:  Negative for confusion.   All other systems reviewed and are negative.   Medications No current facility-administered medications on file prior to encounter.   Current Outpatient Medications on File Prior to Encounter   Medication Sig Dispense Refill   buPROPion (WELLBUTRIN XL) 150 MG 24 hr tablet Take 150 mg by mouth every morning.     desvenlafaxine (PRISTIQ) 50 MG 24 hr tablet Take 50 mg by mouth daily.     hydrOXYzine (ATARAX) 25 MG tablet Take 25 mg by mouth every 8 (eight) hours as needed.     busPIRone (BUSPAR) 15 MG tablet Take 1 tablet by mouth daily. (Patient not taking: Reported on 08/15/2021)     ciprofloxacin (CIPRO) 500 MG tablet Take 1 tablet (500 mg total) by mouth 2 (two) times daily. (Patient not taking: Reported on 12/07/2021) 14 tablet 0   clonazePAM (KLONOPIN) 0.25 MG disintegrating tablet Take 0.25 mg by mouth daily. (Patient not taking: Reported on 08/15/2021)     ferrous sulfate 325 (65 FE) MG EC tablet Take 325 mg by mouth daily. (Patient not taking: Reported on 08/15/2021)     fluconazole (DIFLUCAN) 150 MG tablet Take one tablet by mouth on Day 1. Repeat dose 2nd tablet on Day 3. (Patient not taking: Reported on 12/07/2021) 2 tablet 0   hydroxychloroquine (PLAQUENIL) 200 MG tablet Take by mouth 2 (two) times daily. (Patient not taking: Reported on 12/07/2021)     ibuprofen (ADVIL) 600 MG tablet Take 1 tablet (600 mg total) by mouth every 6 (six) hours as needed for headache, mild pain, moderate pain or cramping. (Patient not taking: Reported on 08/15/2021) 60 tablet 0   SUMAtriptan (IMITREX) 50 MG tablet Take 1 tablet (50 mg total) by mouth once as needed for up to 1 dose for migraine. May repeat one dose in 2 hours if headache persists, for max dose  24 hours (Patient not taking: Reported on 08/15/2021) 12 tablet 2   UNABLE TO FIND Med Name: MINI PILL Birth Control Daily      Pertinent medications related to GI and procedure were reviewed by me with the patient prior to the procedure   Current Facility-Administered Medications:    0.9 %  sodium chloride infusion, , Intravenous, Continuous, Annamaria Helling, DO, Last Rate: 20 mL/hr at 12/07/21 1045, 1,000 mL at 12/07/21 1045  sodium  chloride 1,000 mL (12/07/21 1045)       Allergies  Allergen Reactions   Cephalexin Itching   Allergies were reviewed by me prior to the procedure  Objective    Vitals:   12/07/21 1029  BP: 109/80  Pulse: 81  Resp: 16  Temp: (!) 97.3 F (36.3 C)  TempSrc: Temporal  SpO2: 100%  Weight: 65.9 kg  Height: 5\' 4"  (1.626 m)     Physical Exam Vitals and nursing note reviewed.  Constitutional:      General: She is not in acute distress.    Appearance: Normal appearance. She is not ill-appearing, toxic-appearing or diaphoretic.  HENT:     Head: Normocephalic and atraumatic.     Nose: Nose normal.     Mouth/Throat:     Mouth: Mucous membranes are moist.     Pharynx: Oropharynx is clear.  Eyes:     General: No scleral icterus.    Extraocular Movements: Extraocular movements intact.  Cardiovascular:     Rate and Rhythm: Normal rate and regular rhythm.     Heart sounds: Normal heart sounds. No murmur heard.   No friction rub. No gallop.  Pulmonary:     Effort: Pulmonary effort is normal. No respiratory distress.     Breath sounds: Normal breath sounds. No wheezing, rhonchi or rales.  Abdominal:     General: Bowel sounds are normal. There is no distension.     Palpations: Abdomen is soft.     Tenderness: There is no abdominal tenderness. There is no guarding or rebound.  Musculoskeletal:     Cervical back: Neck supple.     Right lower leg: No edema.     Left lower leg: No edema.  Skin:    General: Skin is warm and dry.     Coloration: Skin is not jaundiced or pale.  Neurological:     General: No focal deficit present.     Mental Status: She is alert and oriented to person, place, and time. Mental status is at baseline.  Psychiatric:        Mood and Affect: Mood normal.        Behavior: Behavior normal.        Thought Content: Thought content normal.        Judgment: Judgment normal.     Assessment:  Ms. Patricia Pearson is a 26 y.o. female  who presents today  for Colonoscopy for hematochezia.  Plan:  Colonoscopy with possible intervention today  Colonoscopy with possible biopsy, control of bleeding, polypectomy, and interventions as necessary has been discussed with the patient/patient representative. Informed consent was obtained from the patient/patient representative after explaining the indication, nature, and risks of the procedure including but not limited to death, bleeding, perforation, missed neoplasm/lesions, cardiorespiratory compromise, and reaction to medications. Opportunity for questions was given and appropriate answers were provided. Patient/patient representative has verbalized understanding is amenable to undergoing the procedure.   Annamaria Helling, DO  [facility]  Portions of the record may  have been created with voice recognition software. Occasional wrong-word or 'sound-a-like' substitutions may have occurred due to the inherent limitations of voice recognition software.  Read the chart carefully and recognize, using context, where substitutions may have occurred.

## 2021-12-07 NOTE — Op Note (Signed)
[facility] Patient Name: Patricia Pearson Procedure Date: 12/07/2021 11:28 AM MRN: [id number] Account #: [id number] Date of Birth: 01-13-1996 Admit Type: Outpatient Age: 26 Room: [facility] ENDO ROOM 1 Gender: Female Note Status: Finalized Instrument Name: Peds Colonoscope [id number] Procedure:             Colonoscopy Indications:           Hematochezia Providers:             Annamaria Helling DO, DO Referring MD:          Olin Hauser (Referring MD) Medicines:             Monitored Anesthesia Care Complications:         No immediate complications. Estimated blood loss:                         Minimal. Procedure:             Pre-Anesthesia Assessment:                        - Prior to the procedure, a History and Physical was                         performed, and patient medications and allergies were                         reviewed. The patient is competent. The risks and                         benefits of the procedure and the sedation options and                         risks were discussed with the patient. All questions                         were answered and informed consent was obtained.                         Patient identification and proposed procedure were                         verified by the physician, the nurse, the anesthetist                         and the technician in the endoscopy suite. Mental                         Status Examination: alert and oriented. Airway                         Examination: normal oropharyngeal airway and neck                         mobility. Respiratory Examination: clear to                         auscultation. CV Examination: RRR, no murmurs, no S3  or S4. Prophylactic Antibiotics: The patient does not                         require prophylactic antibiotics. Prior                         Anticoagulants: The patient has taken no previous                          anticoagulant or antiplatelet agents. ASA Grade                         Assessment: II - A patient with mild systemic disease.                         After reviewing the risks and benefits, the patient                         was deemed in satisfactory condition to undergo the                         procedure. The anesthesia plan was to use monitored                         anesthesia care (MAC). Immediately prior to                         administration of medications, the patient was                         re-assessed for adequacy to receive sedatives. The                         heart rate, respiratory rate, oxygen saturations,                         blood pressure, adequacy of pulmonary ventilation, and                         response to care were monitored throughout the                         procedure. The physical status of the patient was                         re-assessed after the procedure.                        After obtaining informed consent, the colonoscope was                         passed under direct vision. Throughout the procedure,                         the patient's blood pressure, pulse, and oxygen                         saturations were monitored continuously. The  Colonoscope was introduced through the anus and                         advanced to the the terminal ileum, with                         identification of the appendiceal orifice and IC                         valve. The colonoscopy was performed without                         difficulty. The patient tolerated the procedure well.                         The quality of the bowel preparation was evaluated                         using the BBPS [facility] Bowel Preparation Scale) with                         scores of: Right Colon = 3, Transverse Colon = 3 and                         Left Colon = 3 (entire mucosa seen well with no                         residual staining,  small fragments of stool or opaque                         liquid). The total BBPS score equals 9. The terminal                         ileum, ileocecal valve, appendiceal orifice, and                         rectum were photographed. Findings:      The perianal and digital rectal examinations were normal. Pertinent       negatives include normal sphincter tone.      The terminal ileum appeared normal. Estimated blood loss: none.      A 9 to 11 mm polyp was found in the sigmoid colon. The polyp was       pedunculated. The polyp was removed with a hot snare. Resection and       retrieval were complete. Estimated blood loss was minimal.      The exam was otherwise without abnormality on direct and retroflexion       views. Impression:            - The examined portion of the ileum was normal.                        - One 9 to 11 mm polyp in the sigmoid colon, removed                         with a hot snare. Resected and retrieved.                        -  The examination was otherwise normal on direct and                         retroflexion views. Recommendation:        - Discharge patient to home.                        - Resume previous diet.                        - No aspirin, ibuprofen, naproxen, or other                         non-steroidal anti-inflammatory drugs for 5 days after                         polyp removal.                        - Continue present medications.                        - Await pathology results.                        - Repeat colonoscopy for surveillance based on                         pathology results.                        - Return to GI clinic as previously scheduled. Procedure Code(s):     --- Professional ---                        [phone], Colonoscopy, flexible; with removal of                         tumor(s), polyp(s), or other lesion(s) by snare                         technique Diagnosis Code(s):     --- Professional ---                         K63.5, Polyp of colon                        K92.1, Melena (includes Hematochezia) CPT copyright 2019 American Medical Association. All rights reserved. The codes documented in this report are preliminary and upon coder review may  be revised to meet current compliance requirements. Attending Participation:      I personally performed the entire procedure. Volney American, DO Annamaria Helling DO, DO 12/07/2021 12:17:51 PM This report has been signed electronically. Number of Addenda: 0 Note Initiated On: 12/07/2021 11:28 AM Scope Withdrawal Time: 0 hours 15 minutes 18 seconds  Total Procedure Duration: 0 hours 29 minutes 53 seconds  Estimated Blood Loss:  Estimated blood loss was minimal.      [facility]

## 2021-12-08 DIAGNOSIS — F411 Generalized anxiety disorder: Secondary | ICD-10-CM | POA: Diagnosis not present

## 2021-12-08 DIAGNOSIS — F410 Panic disorder [episodic paroxysmal anxiety] without agoraphobia: Secondary | ICD-10-CM | POA: Diagnosis not present

## 2021-12-08 DIAGNOSIS — F320 Major depressive disorder, single episode, mild: Secondary | ICD-10-CM | POA: Diagnosis not present

## 2021-12-08 LAB — SURGICAL PATHOLOGY

## 2022-02-06 DIAGNOSIS — F320 Major depressive disorder, single episode, mild: Secondary | ICD-10-CM | POA: Diagnosis not present

## 2022-02-06 DIAGNOSIS — F411 Generalized anxiety disorder: Secondary | ICD-10-CM | POA: Diagnosis not present

## 2022-02-06 DIAGNOSIS — F410 Panic disorder [episodic paroxysmal anxiety] without agoraphobia: Secondary | ICD-10-CM | POA: Diagnosis not present

## 2022-02-21 ENCOUNTER — Ambulatory Visit (INDEPENDENT_AMBULATORY_CARE_PROVIDER_SITE_OTHER): Payer: BC Managed Care – PPO | Admitting: Family Medicine

## 2022-02-21 ENCOUNTER — Encounter: Payer: Self-pay | Admitting: Family Medicine

## 2022-02-21 VITALS — BP 124/70 | HR 101 | Wt 150.0 lb

## 2022-02-21 DIAGNOSIS — J029 Acute pharyngitis, unspecified: Secondary | ICD-10-CM

## 2022-02-21 LAB — POCT RAPID STREP A (OFFICE): Rapid Strep A Screen: NEGATIVE

## 2022-02-21 MED ORDER — AMOXICILLIN 500 MG PO CAPS: 500.0000 mg | ORAL_CAPSULE | Freq: Two times a day (BID) | ORAL | 0 refills | Status: DC

## 2022-02-21 NOTE — Progress Notes (Signed)
? ?Subjective:  ? ? Patient ID: Patricia Pearson, female    DOB: June 14, 1996, 26 y.o.   MRN: [id number] ? ?Patricia Pearson is a 26 y.o. female presenting on 02/21/2022 for Sore Throat and Fever ? ?Patient presents for a same day appointment. ? ? ?HPI ? 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 She woke up today with fever 101F and sore throat. Out of work today. No cough or sinus drainage or congestion. No other sick contacts. Took tylenol for fever ? ? ? ?  02/21/2022  ?  2:18 PM 08/15/2021  ?  9:54 AM 09/16/2020  ?  3:00 PM  ?Depression screen PHQ 2/9  ?Decreased Interest 1 2 0  ?Down, Depressed, Hopeless 0 1 0  ?PHQ - 2 Score 1 3 0  ?Altered sleeping 1 0 0  ?Tired, decreased energy 3 3 0  ?Change in appetite 1 0 0  ?Feeling bad or failure about yourself  0 0 0  ?Trouble concentrating 0 0 0  ?Moving slowly or fidgety/restless 0 0 0  ?Suicidal thoughts 0 0 0  ?PHQ-9 Score 6 6 0  ?Difficult doing work/chores Somewhat difficult Somewhat difficult Not difficult at all  ? ? ?Social History  ? ?Tobacco Use  ? Smoking status: Never  ? Smokeless tobacco: Never  ?Vaping Use  ? Vaping Use: Every day  ? Start date: 09/20/2021  ?Substance Use Topics  ? Alcohol use: No  ? Drug use: No  ? ? ?Review of Systems ?Per HPI unless specifically indicated above ? ?   ?Objective:  ?  ?BP 124/70   Pulse (!) 101   Wt 150 lb (68 kg)   SpO2 100%   BMI 25.75 kg/m?   ?Wt Readings from Last 3 Encounters:  ?02/21/22 150 lb (68 kg)  ?12/07/21 145 lb 4.5 oz (65.9 kg)  ?08/15/21 152 lb (68.9 kg)  ?  ?Physical Exam ?Vitals and nursing note reviewed.  ?Constitutional:   ?   General: She is not in acute distress. ?   Appearance: Normal appearance. She is well-developed. She is not diaphoretic.  ?   Comments: Well-appearing, comfortable, cooperative  ?HENT:  ?   Head: Normocephalic and atraumatic.  ?   Mouth/Throat:  ?   Mouth: Mucous membranes are moist.  ?   Pharynx: Pharyngeal  swelling and posterior oropharyngeal erythema present.  ?Eyes:  ?   General:     ?   Right eye: No discharge.     ?   Left eye: No discharge.  ?   Conjunctiva/sclera: Conjunctivae normal.  ?Cardiovascular:  ?   Rate and Rhythm: Normal rate.  ?Pulmonary:  ?   Effort: Pulmonary effort is normal.  ?Skin: ?   General: Skin is warm and dry.  ?   Findings: No erythema or rash.  ?Neurological:  ?   Mental Status: She is alert and oriented to person, place, and time.  ?Psychiatric:     ?   Mood and Affect: Mood normal.     ?   Behavior: Behavior normal.     ?   Thought Content: Thought content normal.  ?   Comments: Well groomed, good eye contact, normal speech and thoughts  ? ?Results for orders placed or performed in visit on 02/21/22  ?POCT rapid strep A  ?Result Value Ref Range  ? Rapid Strep A Screen Negative Negative  ? ?   ?Assessment & Plan:  ? ?  Problem List Items Addressed This Visit   ?None ?Visit Diagnoses   ? ? Acute pharyngitis, unspecified etiology    -  Primary  ? Sore throat      ? Relevant Orders  ? POCT rapid strep A (Completed)  ? ?  ?  ? ?TREAT STREP, NEGATIVE RAPID ?Consistent with acute pharyngitis, concern for possible strep given history acute pharyngitis without viral prodrome, lack of cough, known sick contact w strep ?  ?Plan: ?1. Rapid strep negative today - still have high suspicion for strep ?2. Start empiric antibiotic with Amoxicillin '500mg'$  BID x 10 days, will adjust based on improvement and culture results ?4. Symptomatic control with NSAID / Tylenol regularly then PRN ?5. Improve hydration, advance diet, warm herbal tea with honey ?6. Follow-up within 1 week if worsening ? ?Work note given ? ?No orders of the defined types were placed in this encounter. ? ? ? ? ?Follow up plan: ?Return if symptoms worsen or fail to improve. ? ? ?Nobie Putnam, DO ?[facility] ?[facility] ?02/21/2022, 2:29 PM ?

## 2022-02-21 NOTE — Patient Instructions (Addendum)
Thank you for coming to the office today. ? ?We will cover you for possible Strep Throat with antibiotics ? ?- Take regular NSAID with either Aleve 1-2 pills twice a day OR Ibuprofen 400 to '600mg'$  per dose with food every 6-8 hours or 3 times a day for next 3 to 5 days regularly, then as needed only ?- Take Tylenol 500-'1000mg'$  per dose as needed every 8 hours or 3 times a day between for pain, fevers, or chills ?- Drink extra clear fluids (water, or G2 gatorade), try colder soft foods if needed otherwise regular diet ?- Drink warm herbal tea with honey for sore throat ?  ?There is a chance that this is more of a Viral Pharyngitis, in which case it will take time to run it's course regardless, and may have some hoarseness or throat pain lasting for up to 7-10 days then improve ? ?Please schedule a Follow-up Appointment to: Return if symptoms worsen or fail to improve. ? ?If you have any other questions or concerns, please feel free to call the office or send a message through [facility]. You may also schedule an earlier appointment if necessary. ? ?Additionally, you may be receiving a survey about your experience at our office within a few days to 1 week by e-mail or mail. We value your feedback. ? ?Nobie Putnam, DO ?[facility] ?

## 2022-03-13 DIAGNOSIS — F410 Panic disorder [episodic paroxysmal anxiety] without agoraphobia: Secondary | ICD-10-CM | POA: Diagnosis not present

## 2022-03-13 DIAGNOSIS — F320 Major depressive disorder, single episode, mild: Secondary | ICD-10-CM | POA: Diagnosis not present

## 2022-03-13 DIAGNOSIS — F411 Generalized anxiety disorder: Secondary | ICD-10-CM | POA: Diagnosis not present

## 2022-04-12 DIAGNOSIS — F411 Generalized anxiety disorder: Secondary | ICD-10-CM | POA: Diagnosis not present

## 2022-04-12 DIAGNOSIS — F410 Panic disorder [episodic paroxysmal anxiety] without agoraphobia: Secondary | ICD-10-CM | POA: Diagnosis not present

## 2022-04-12 DIAGNOSIS — F320 Major depressive disorder, single episode, mild: Secondary | ICD-10-CM | POA: Diagnosis not present

## 2022-05-22 DIAGNOSIS — F411 Generalized anxiety disorder: Secondary | ICD-10-CM | POA: Diagnosis not present

## 2022-05-22 DIAGNOSIS — F410 Panic disorder [episodic paroxysmal anxiety] without agoraphobia: Secondary | ICD-10-CM | POA: Diagnosis not present

## 2022-05-22 DIAGNOSIS — F320 Major depressive disorder, single episode, mild: Secondary | ICD-10-CM | POA: Diagnosis not present

## 2022-06-06 DIAGNOSIS — R300 Dysuria: Secondary | ICD-10-CM | POA: Diagnosis not present

## 2022-06-06 DIAGNOSIS — N390 Urinary tract infection, site not specified: Secondary | ICD-10-CM | POA: Diagnosis not present

## 2022-06-21 IMAGING — CR DG CHEST 2V
2 series · 2 of 2 positions shown · non-contrast
Comparison: Radiograph 06/20/2019, CT 06/19/2019

CLINICAL DATA: Chest pain for 2 hours after taking prednisone

EXAM:
CHEST - 2 VIEW

[chest pa]
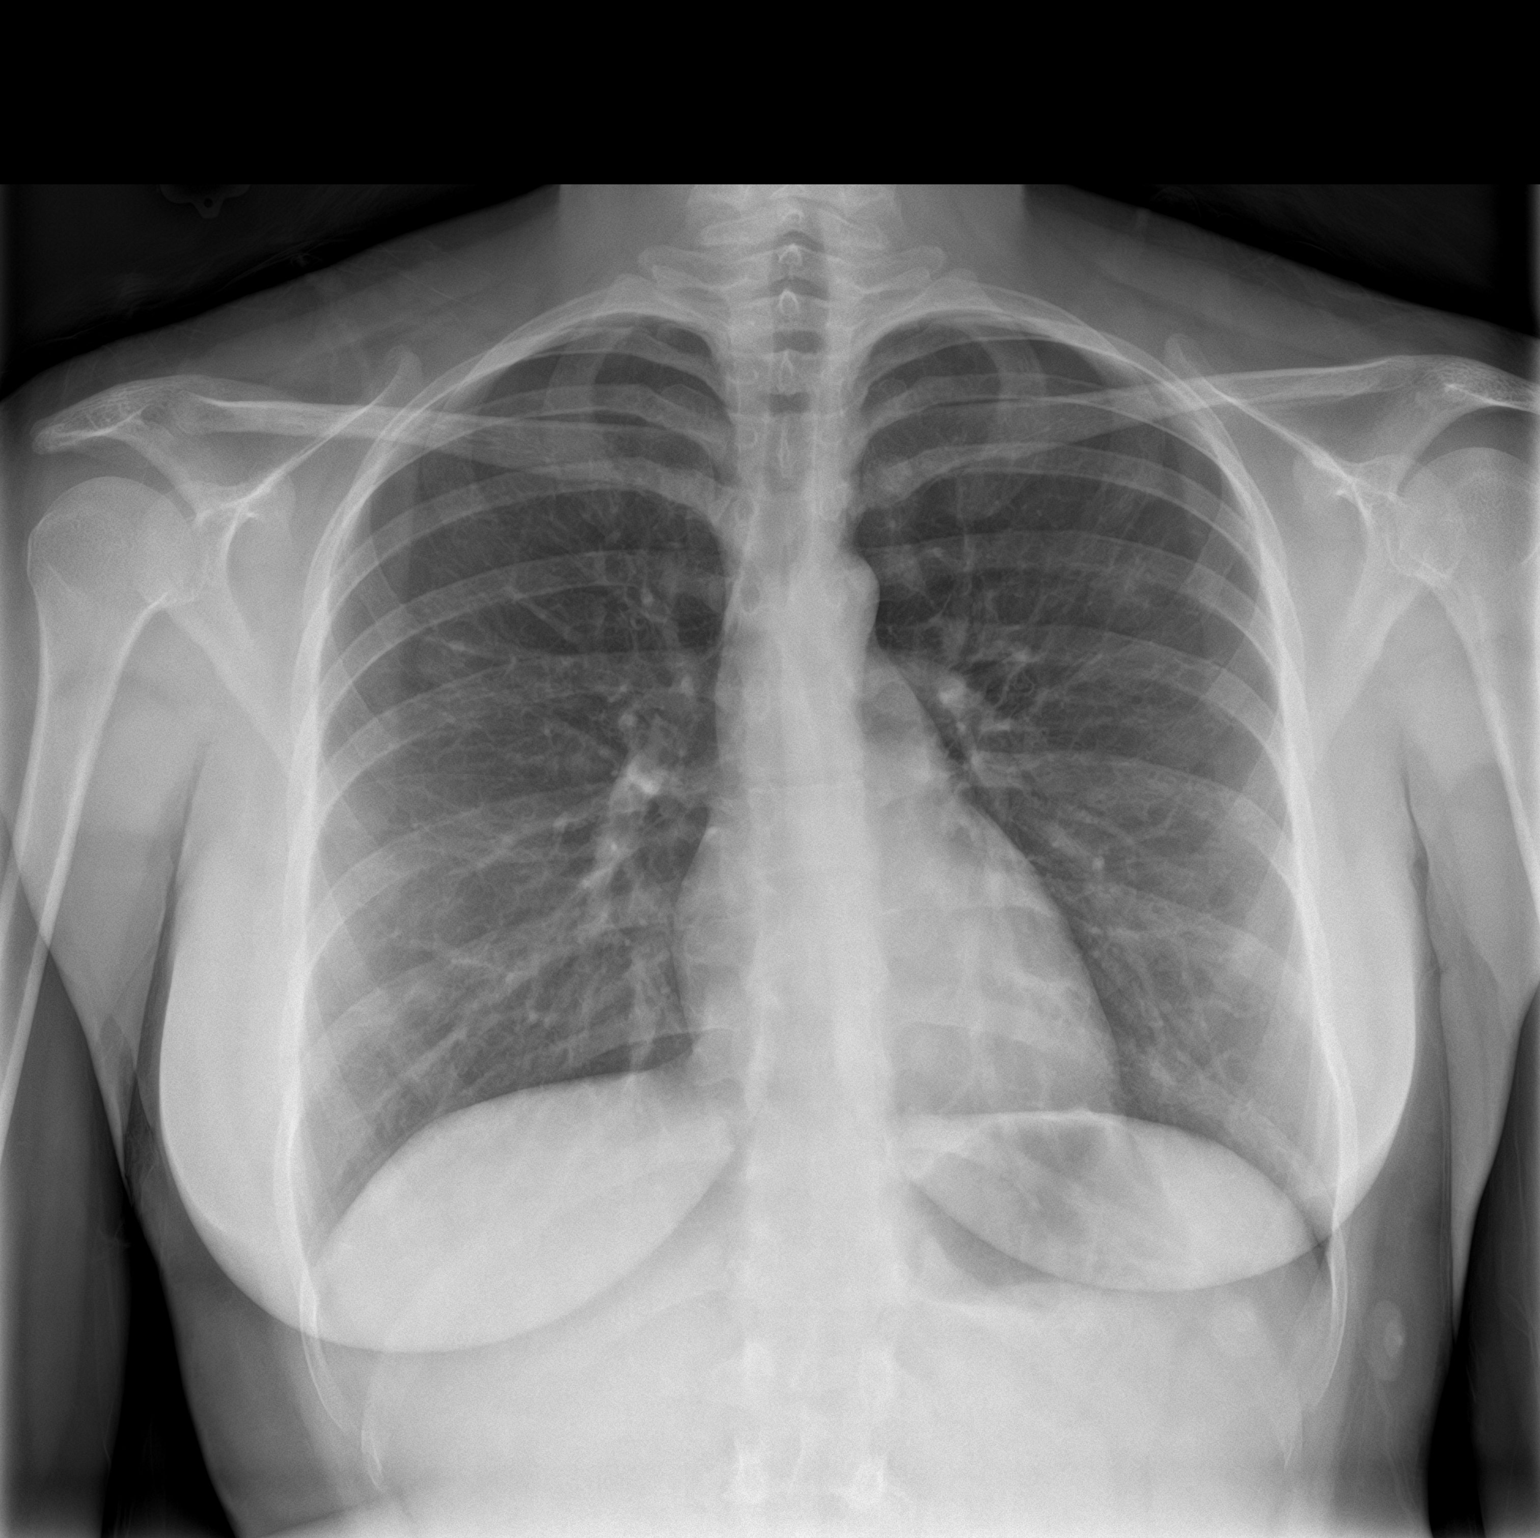

[chest lat]
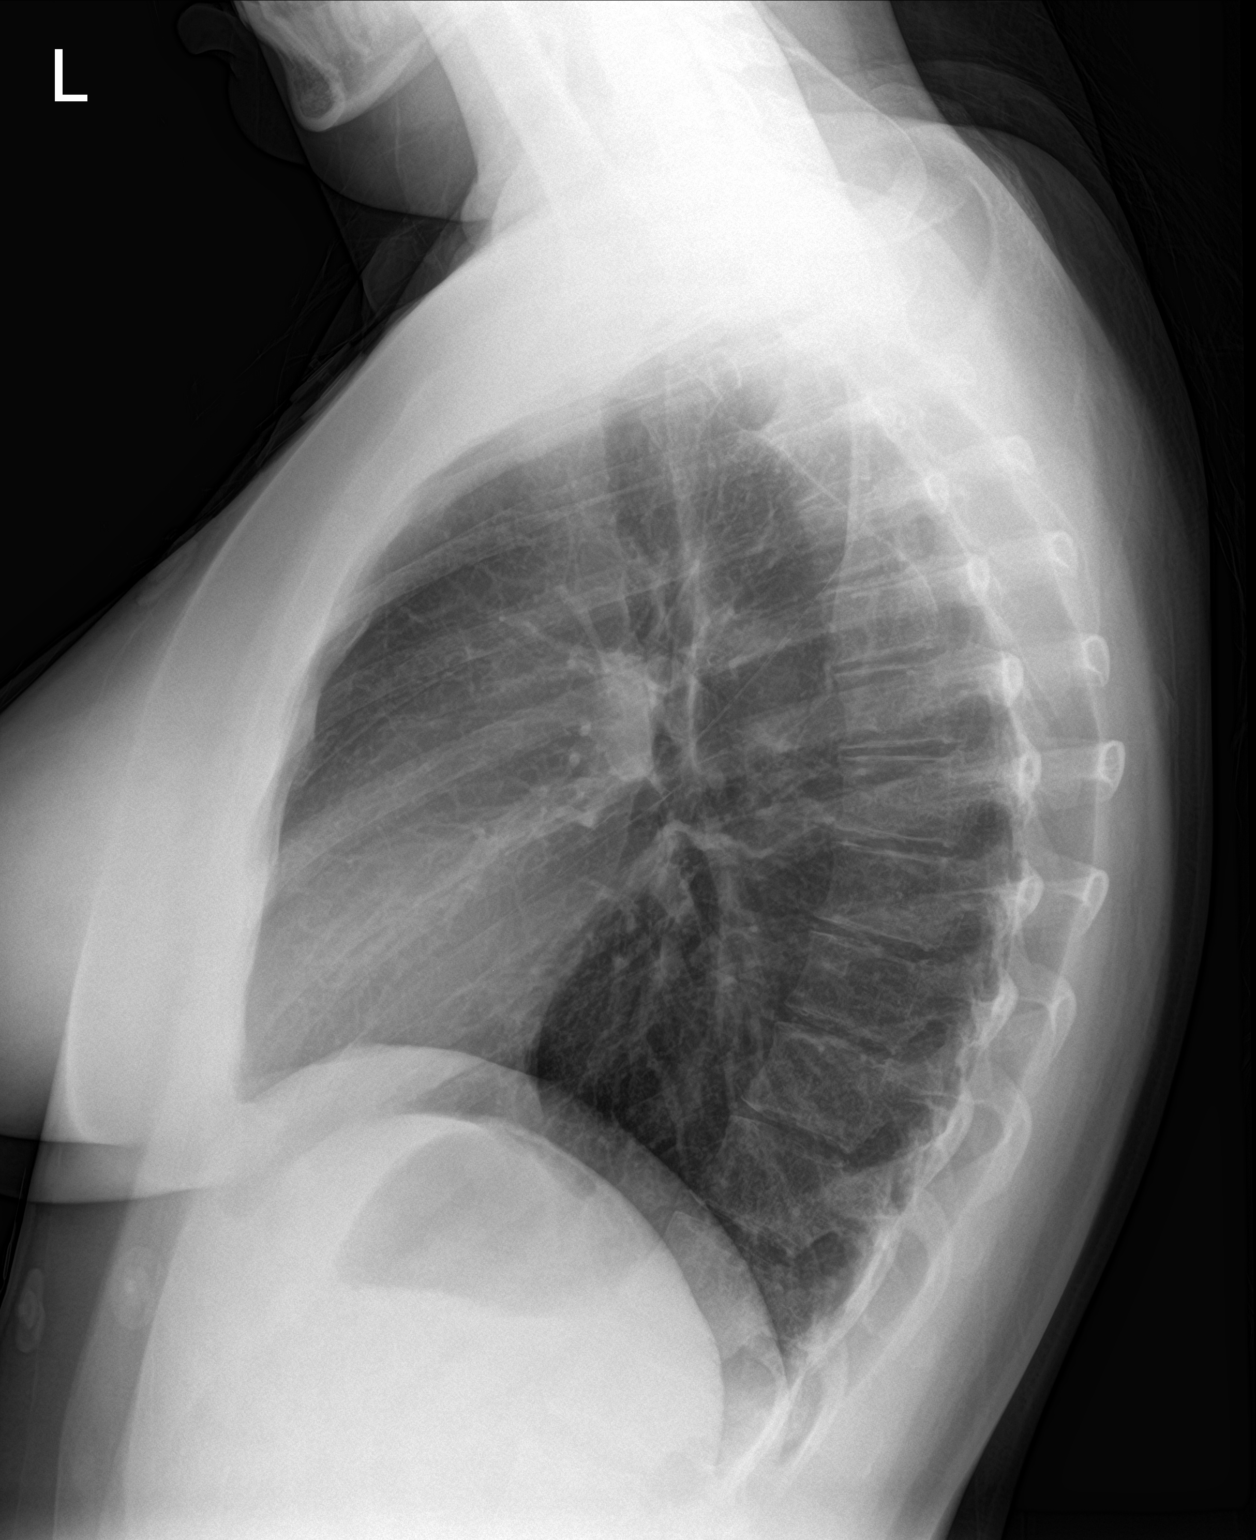

[2 of 2 positions shown; findings below may reference images not displayed]

FINDINGS: No consolidation, features of edema, pneumothorax, or effusion.
Pulmonary vascularity is normally distributed. The cardiomediastinal
contours are unremarkable. No acute osseous or soft tissue
abnormality.
IMPRESSION: No acute cardiopulmonary abnormality.

## 2022-07-17 DIAGNOSIS — F320 Major depressive disorder, single episode, mild: Secondary | ICD-10-CM | POA: Diagnosis not present

## 2022-07-17 DIAGNOSIS — F410 Panic disorder [episodic paroxysmal anxiety] without agoraphobia: Secondary | ICD-10-CM | POA: Diagnosis not present

## 2022-07-17 DIAGNOSIS — F411 Generalized anxiety disorder: Secondary | ICD-10-CM | POA: Diagnosis not present

## 2022-10-01 DIAGNOSIS — F410 Panic disorder [episodic paroxysmal anxiety] without agoraphobia: Secondary | ICD-10-CM | POA: Diagnosis not present

## 2022-10-01 DIAGNOSIS — F411 Generalized anxiety disorder: Secondary | ICD-10-CM | POA: Diagnosis not present

## 2022-10-01 DIAGNOSIS — F320 Major depressive disorder, single episode, mild: Secondary | ICD-10-CM | POA: Diagnosis not present

## 2022-10-15 ENCOUNTER — Encounter: Payer: Self-pay | Admitting: Internal Medicine

## 2022-10-15 ENCOUNTER — Ambulatory Visit: Payer: BC Managed Care – PPO | Admitting: Internal Medicine

## 2022-10-15 ENCOUNTER — Ambulatory Visit: Payer: Self-pay

## 2022-10-15 VITALS — BP 128/82 | HR 94 | Temp 96.8°F | Wt 183.0 lb

## 2022-10-15 DIAGNOSIS — R1111 Vomiting without nausea: Secondary | ICD-10-CM | POA: Diagnosis not present

## 2022-10-15 DIAGNOSIS — R42 Dizziness and giddiness: Secondary | ICD-10-CM | POA: Diagnosis not present

## 2022-10-15 MED ORDER — MECLIZINE HCL 25 MG PO TABS: 25.0000 mg | ORAL_TABLET | Freq: Three times a day (TID) | ORAL | 0 refills | Status: AC | PRN

## 2022-10-15 NOTE — Progress Notes (Signed)
Subjective:    Patient ID: Patricia Pearson, female    DOB: 1996/10/25, 26 y.o.   MRN: [id number]  HPI  Patient presents to the clinic today with complaint of dizziness.  This started 2 1/2 - 3 weeks ago.  She describes the dizziness as sensation of off balance.  She reports associated vomiting.  She denies vision changes, headache, palpitations, shortness of breath, nausea or syncope. She notices it mostly after eating. She has not had a change in her diet. She is drinking enough water. She restarted Vibryd 1 week ago but she has taken this in the past. She has checked her blood sugar and blood pressure when she has felt this way with normal results.  Review of Systems     Past Medical History:  Diagnosis Date   Anemia    Anxiety    Bilateral ovarian cysts    History of kidney stones     Current Outpatient Medications  Medication Sig Dispense Refill   amoxicillin (AMOXIL) 500 MG capsule Take 1 capsule (500 mg total) by mouth 2 (two) times daily. For 10 days 20 capsule 0   ARIPiprazole (ABILIFY) 2 MG tablet Take 2 mg by mouth daily.     buPROPion (WELLBUTRIN XL) 150 MG 24 hr tablet Take 150 mg by mouth every morning.     busPIRone (BUSPAR) 15 MG tablet Take 1 tablet by mouth daily. (Patient not taking: Reported on 08/15/2021)     clonazePAM (KLONOPIN) 0.25 MG disintegrating tablet Take 0.25 mg by mouth daily. (Patient not taking: Reported on 08/15/2021)     desvenlafaxine (PRISTIQ) 50 MG 24 hr tablet Take 50 mg by mouth daily.     ferrous sulfate 325 (65 FE) MG EC tablet Take 325 mg by mouth daily. (Patient not taking: Reported on 08/15/2021)     hydroxychloroquine (PLAQUENIL) 200 MG tablet Take by mouth 2 (two) times daily. (Patient not taking: Reported on 12/07/2021)     hydrOXYzine (ATARAX) 25 MG tablet Take 25 mg by mouth every 8 (eight) hours as needed. (Patient not taking: Reported on 02/21/2022)     ibuprofen (ADVIL) 600 MG tablet Take 1 tablet (600 mg total) by mouth every 6  (six) hours as needed for headache, mild pain, moderate pain or cramping. (Patient not taking: Reported on 08/15/2021) 60 tablet 0   SUMAtriptan (IMITREX) 50 MG tablet Take 1 tablet (50 mg total) by mouth once as needed for up to 1 dose for migraine. May repeat one dose in 2 hours if headache persists, for max dose 24 hours (Patient not taking: Reported on 08/15/2021) 12 tablet 2   No current facility-administered medications for this visit.    Allergies  Allergen Reactions   Cephalexin Itching    Family History  Problem Relation Age of Onset   Basal cell carcinoma Mother    Anxiety disorder Mother    Drug abuse Father    Peptic Ulcer Father    Heart failure Maternal Grandfather    Lung cancer Paternal Grandmother    Colon cancer Neg Hx    Cervical cancer Neg Hx    Breast cancer Neg Hx     Social History   Socioeconomic History   Marital status: Single    Spouse name: Not on file   Number of children: Not on file   Years of education: College   Highest education level: Not on file  Occupational History   Occupation: Psychologist, sport and exercise)    Comment: Psychologist, 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 Lives with her boyfriend Loel Dubonnet [phone]   Social Determinants of Health   Financial Resource Strain: Not on file  Food Insecurity: Not on file  Transportation Needs: Not on file  Physical Activity: Not on file  Stress: Not on file  Social Connections: Not on file  Intimate Partner Violence: Not on file     Constitutional: Denies fever, malaise, fatigue, headache or abrupt weight changes.  HEENT: Denies eye  pain, eye redness, ear pain, ringing in the ears, wax buildup, runny nose, nasal congestion, bloody nose, or sore throat. Respiratory: Denies difficulty breathing, shortness of breath, cough or sputum production.   Cardiovascular: Denies chest pain, chest tightness, palpitations or swelling in the hands or feet.  Gastrointestinal: Patient reports vomiting.  Denies abdominal pain, bloating, constipation, diarrhea or blood in the stool.  GU: Denies urgency, frequency, pain with urination, burning sensation, blood in urine, odor or discharge. Musculoskeletal: Denies decrease in range of motion, difficulty with gait, muscle pain or joint pain and swelling.  Skin: Denies redness, rashes, lesions or ulcercations.  Neurological: Patient reports dizziness.  Denies difficulty with memory, difficulty with speech or problems with balance and coordination.    No other specific complaints in a complete review of systems (except as listed in HPI above).  Objective:   Physical Exam   BP 128/82 (BP Location: Left Arm, Patient Position: Sitting, Cuff Size: Normal)   Pulse 94   Temp (!) 96.8 F (36 C) (Temporal)   Wt 183 lb (83 kg)   SpO2 99%   BMI 31.41 kg/m   Wt Readings from Last 3 Encounters:  02/21/22 150 lb (68 kg)  12/07/21 145 lb 4.5 oz (65.9 kg)  08/15/21 152 lb (68.9 kg)    General: Appears her stated age, obese, in NAD. HEENT: Head: normal shape and size; Eyes: sclera white, no icterus, conjunctiva pink, PERRLA and EOMs intact; Ears: Tm's gray and intact, normal light reflex;  Neck:  Neck supple, trachea midline. No masses, lumps or thyromegaly present.  Cardiovascular: Normal rate and rhythm. S1,S2 noted.  No murmur, rubs or gallops noted.  Pulmonary/Chest: Normal effort and positive vesicular breath sounds. No respiratory distress. No wheezes, rales or ronchi noted.  Musculoskeletal: No difficulty with gait.  Neurological: Alert and oriented. Coordination normal.    BMET     Component Value Date/Time   NA 136 06/24/2020 0926   K 3.9 06/24/2020 0926   CL 101 06/24/2020 0926   CO2 30 06/24/2020 0926   GLUCOSE 80 06/24/2020 0926   BUN 10 06/24/2020 0926   CREATININE 0.81 06/24/2020 0926   CALCIUM 8.8 06/24/2020 0926   GFRNONAA 104 06/24/2020 0926   GFRAA 120 06/24/2020 0926    Lipid Panel  No results found for: "CHOL", "TRIG", "HDL", "CHOLHDL", "VLDL", "LDLCALC"  CBC    Component Value Date/Time   WBC 7.9 06/24/2020 0926   RBC 4.60 06/24/2020 0926   HGB 13.7 06/24/2020 0926   HCT 41.3 06/24/2020 0926   PLT 219 06/24/2020 0926   MCV 89.8 06/24/2020 0926   MCH  29.8 06/24/2020 0926   MCHC 33.2 06/24/2020 0926   RDW 12.4 06/24/2020 0926   LYMPHSABS 1,967 06/24/2020 0926   MONOABS 1.3 (H) 06/21/2020 2258   EOSABS 111 06/24/2020 0926   BASOSABS 47 06/24/2020 0926    Hgb A1C No results found for: "HGBA1C"         Assessment & Plan:   Dizziness, Vomiting:  Encouraged her to eat every 3-4 hours and drink adequate amounts of water Will check CBC, CMET and TSH today RX for Meclizine 25 mg TID prn   Will follow-up after labs with further recommendation and treatment plan, follow-up with your PCP as previously scheduled Webb Silversmith, NP

## 2022-10-15 NOTE — Patient Instructions (Signed)
Dizziness Dizziness is a common problem. It makes you feel unsteady or light-headed. You may feel like you are about to pass out (faint). Dizziness can lead to getting hurt if you stumble or fall. Dizziness can be caused by many things, including: Medicines. Not having enough water in your body (dehydration). Illness. Follow these instructions at home: Eating and drinking  Drink enough fluid to keep your pee (urine) pale yellow. This helps to keep you from getting dehydrated. Try to drink more clear fluids, such as water. Do not drink alcohol. Limit how much caffeine you drink or eat, if your doctor tells you to do that. Limit how much salt (sodium) you drink or eat, if your doctor tells you to do that. Activity  Avoid making quick movements. Stand up slowly from sitting in a chair, and steady yourself until you feel okay. In the morning, first sit up on the side of the bed. When you feel okay, stand up slowly while you hold onto something. Do this until you know that your balance is okay. If you need to stand in one place for a long time, move your legs often. Tighten and relax the muscles in your legs while you are standing. Do not drive or use machinery if you feel dizzy. Avoid bending down if you feel dizzy. Place items in your home so you can reach them easily without leaning over. Lifestyle Do not smoke or use any products that contain nicotine or tobacco. If you need help quitting, ask your doctor. Try to lower your stress level. You can do this by using methods such as yoga or meditation. Talk with your doctor if you need help. General instructions Watch your dizziness for any changes. Take over-the-counter and prescription medicines only as told by your doctor. Talk with your doctor if you think that you are dizzy because of a medicine that you are taking. Tell a friend or a family member that you are feeling dizzy. If he or she notices any changes in your behavior, have this  person call your doctor. Keep all follow-up visits. Contact a doctor if: Your dizziness does not go away. Your dizziness or light-headedness gets worse. You feel like you may vomit (are nauseous). You have trouble hearing. You have new symptoms. You are unsteady on your feet. You feel like the room is spinning. You have neck pain or a stiff neck. You have a fever. Get help right away if: You vomit or have watery poop (diarrhea), and you cannot eat or drink anything. You have trouble: Talking. Walking. Swallowing. Using your arms, hands, or legs. You feel generally weak. You are not thinking clearly, or you have trouble forming sentences. A friend or family member may notice this. You have: Chest pain. Pain in your belly (abdomen). Shortness of breath. Sweating. Your vision changes. You are bleeding. You have a very bad headache. These symptoms may be an emergency. Get help right away. Call your local emergency services (911 in the [place] Do not wait to see if the symptoms will go away. Do not drive yourself to the hospital. Summary Dizziness makes you feel unsteady or light-headed. You may feel like you are about to pass out (faint). Drink enough fluid to keep your pee (urine) pale yellow. Do not drink alcohol. Avoid making quick movements if you feel dizzy. Watch your dizziness for any changes. This information is not intended to replace advice given to you by your health care provider. Make sure you discuss any questions   you have with your health care provider. Document Revised: 09/26/2020 Document Reviewed: 09/26/2020 Elsevier Patient Education  2023 Elsevier Inc.  

## 2022-10-15 NOTE — Telephone Encounter (Signed)
  Chief Complaint: Dizziness/vomiting Symptoms: Woozy, room is "wavy" . Vomiting Frequency: 2 weeks Pertinent Negatives: Patient denies fever Disposition: '[]'$ ED /'[]'$ Urgent Care (no appt availability in office) / '[x]'$ Appointment(In office/virtual)/ '[]'$  [place] [facility]/ '[]'$ Home Care/ '[]'$ Refused Recommended Disposition /'[]'$ [facility]/ '[]'$  Follow-up with PCP Additional Notes: PT states for the past 2 weeks she has had dizziness, vertigo, and vomiting. This often happens after eating. PT took her fasting blood sugar this morning and it was 115. Pt states that is has been getting worse.   Summary: dizziness/ nausea   Pt states she has been dizzy x2w and nauseas mainly after she eats  Please fu w/ pt     Reason for Disposition  [1] MODERATE dizziness (e.g., interferes with normal activities) AND [2] has NOT been evaluated by doctor (or NP/PA) for this  (Exception: Dizziness caused by heat exposure, sudden standing, or poor fluid intake.)  Answer Assessment - Initial Assessment Questions 1. DESCRIPTION: "Describe your dizziness."     Lightheaded 2. LIGHTHEADED: "Do you feel lightheaded?" (e.g., somewhat faint, woozy, weak upon standing)      3. VERTIGO: "Do you feel like either you or the room is spinning or tilting?" (i.e. vertigo)     Room is wavy - feels unsteady 4. SEVERITY: "How bad is it?"  "Do you feel like you are going to faint?" "Can you stand and walk?"   - MILD: Feels slightly dizzy, but walking normally.   - MODERATE: Feels unsteady when walking, but not falling; interferes with normal activities (e.g., school, work).   - SEVERE: Unable to walk without falling, or requires assistance to walk without falling; feels like passing out now.      Mild - Moderate 5. ONSET:  "When did the dizziness begin?"     2 weeks 6. AGGRAVATING FACTORS: "Does anything make it worse?" (e.g., standing, change in head position)     Vomiting makes it better 7. HEART RATE: "Can you tell me  your heart rate?" "How many beats in 15 seconds?"  (Note: not all patients can do this)        8. CAUSE: "What do you think is causing the dizziness?"     Migraines 9. RECURRENT SYMPTOM: "Have you had dizziness before?" If Yes, ask: "When was the last time?" "What happened that time?"     no 10. OTHER SYMPTOMS: "Do you have any other symptoms?" (e.g., fever, chest pain, vomiting, diarrhea, bleeding)       Vomiting 11. PREGNANCY: "Is there any chance you are pregnant?" "When was your last menstrual period?"       no  Protocols used: Dizziness - Lightheadedness-A-AH

## 2022-10-16 LAB — TEST AUTHORIZATION: TEST CODE:: 899

## 2022-10-16 LAB — COMPLETE METABOLIC PANEL WITH GFR
AG Ratio: 1.5 (calc) (ref 1.0–2.5)
ALT: 16 U/L (ref 6–29)
AST: 15 U/L (ref 10–30)
Albumin: 4.5 g/dL (ref 3.6–5.1)
Alkaline phosphatase (APISO): 74 U/L (ref 31–125)
BUN: 10 mg/dL (ref 7–25)
CO2: 27 mmol/L (ref 20–32)
Calcium: 8.9 mg/dL (ref 8.6–10.2)
Chloride: 105 mmol/L (ref 98–110)
Creat: 0.77 mg/dL (ref 0.50–0.96)
Globulin: 3 g/dL (calc) (ref 1.9–3.7)
Glucose, Bld: 68 mg/dL (ref 65–99)
Potassium: 4 mmol/L (ref 3.5–5.3)
Sodium: 139 mmol/L (ref 135–146)
Total Bilirubin: 0.2 mg/dL (ref 0.2–1.2)
Total Protein: 7.5 g/dL (ref 6.1–8.1)
eGFR: 110 mL/min/{1.73_m2} (ref >=60)

## 2022-10-16 LAB — CBC
HCT: 38.3 % (ref 35.0–45.0)
Hemoglobin: 12.9 g/dL (ref 11.7–15.5)
MCH: 29.3 pg (ref 27.0–33.0)
MCHC: 33.7 g/dL (ref 32.0–36.0)
MCV: 86.8 fL (ref 80.0–100.0)
MPV: 11.8 fL (ref 7.5–12.5)
Platelets: 252 10*3/uL (ref 140–400)
RBC: 4.41 10*6/uL (ref 3.80–5.10)
RDW: 13.6 % (ref 11.0–15.0)
WBC: 6.6 10*3/uL (ref 3.8–10.8)

## 2022-10-16 LAB — TSH: TSH: 1.89 mIU/L

## 2022-10-22 DIAGNOSIS — F411 Generalized anxiety disorder: Secondary | ICD-10-CM | POA: Diagnosis not present

## 2022-10-22 DIAGNOSIS — F320 Major depressive disorder, single episode, mild: Secondary | ICD-10-CM | POA: Diagnosis not present

## 2022-10-22 DIAGNOSIS — F410 Panic disorder [episodic paroxysmal anxiety] without agoraphobia: Secondary | ICD-10-CM | POA: Diagnosis not present

## 2022-11-01 DIAGNOSIS — F410 Panic disorder [episodic paroxysmal anxiety] without agoraphobia: Secondary | ICD-10-CM | POA: Diagnosis not present

## 2022-11-01 DIAGNOSIS — F411 Generalized anxiety disorder: Secondary | ICD-10-CM | POA: Diagnosis not present

## 2022-11-01 DIAGNOSIS — F320 Major depressive disorder, single episode, mild: Secondary | ICD-10-CM | POA: Diagnosis not present

## 2022-12-11 ENCOUNTER — Ambulatory Visit: Payer: BC Managed Care – PPO | Admitting: Family Medicine

## 2022-12-11 ENCOUNTER — Encounter: Payer: Self-pay | Admitting: Family Medicine

## 2022-12-11 VITALS — BP 100/80 | HR 127 | Ht 64.0 in | Wt 172.0 lb

## 2022-12-11 DIAGNOSIS — I4711 Inappropriate sinus tachycardia, so stated: Secondary | ICD-10-CM | POA: Diagnosis not present

## 2022-12-11 DIAGNOSIS — R42 Dizziness and giddiness: Secondary | ICD-10-CM | POA: Diagnosis not present

## 2022-12-11 DIAGNOSIS — G90A Postural orthostatic tachycardia syndrome (POTS): Secondary | ICD-10-CM

## 2022-12-11 NOTE — Progress Notes (Unsigned)
Subjective:    Patient ID: Patricia Pearson, female    DOB: 1996/05/05, 27 y.o.   MRN: [id number]  Patricia Pearson is a 27 y.o. female presenting on 12/11/2022 for Dizziness, Anxiety, and Depression   HPI  Suspected POTS  Thought to have tachycardia / POTS with   Has seen Psychiatry for 2+ years for anxiety / depression but ineffective. Has tried 10 different drugs  Seen 1-2 month ago here by Webb Silversmith, FNP, given meclizine AS NEEDED  She has struggled with fatigue, high heart rate and shortness of breath, occasional dizziness.  She has had issue with triggers bending over forward and needing period of time to rest.  Previously Cardiology 07/08/20 treated for palpitations with Metoprolol 12.'5mg'$  XL daily, she had holter monitor and diagnosed with tachycardia  Last labs 10/2022 TSH, CMET CBC all normal  Timeline with her pregnancy history, initial pregnant 10/2018 through 06/2019.      12/11/2022   10:08 AM 02/21/2022    2:18 PM 08/15/2021    9:54 AM  Depression screen PHQ 2/9  Decreased Interest 0 1 2  Down, Depressed, Hopeless 1 0 1  PHQ - 2 Score '1 1 3  '$ Altered sleeping 2 1 0  Tired, decreased energy '3 3 3  '$ Change in appetite 2 1 0  Feeling bad or failure about yourself  0 0 0  Trouble concentrating 0 0 0  Moving slowly or fidgety/restless 0 0 0  Suicidal thoughts 0 0 0  PHQ-9 Score '8 6 6  '$ Difficult doing work/chores Very difficult Somewhat difficult Somewhat difficult    Social History   Tobacco Use   Smoking status: Never   Smokeless tobacco: Never  Vaping Use   Vaping Use: Every day   Start date: 09/20/2021  Substance Use Topics   Alcohol use: No   Drug use: No    Review of Systems Per HPI unless specifically indicated above     Objective:    BP 100/80   Pulse (!) 127   Ht '5\' 4"'$  (1.626 m)   Wt 172 lb (78 kg)   SpO2 96%   BMI 29.52 kg/m   Wt Readings from Last 3 Encounters:  12/11/22 172 lb (78 kg)  10/15/22 183 lb (83 kg)  02/21/22  150 lb (68 kg)    Physical Exam Vitals and nursing note reviewed.  Constitutional:      General: She is not in acute distress.    Appearance: She is well-developed. She is not diaphoretic.     Comments: Well-appearing, comfortable, cooperative  HENT:     Head: Normocephalic and atraumatic.  Eyes:     General:        Right eye: No discharge.        Left eye: No discharge.     Conjunctiva/sclera: Conjunctivae normal.  Neck:     Thyroid: No thyromegaly.  Cardiovascular:     Rate and Rhythm: Regular rhythm. Tachycardia present.     Heart sounds: Normal heart sounds. No murmur heard. Pulmonary:     Effort: Pulmonary effort is normal. No respiratory distress.     Breath sounds: Normal breath sounds. No wheezing or rales.  Musculoskeletal:        General: Normal range of motion.     Cervical back: Normal range of motion and neck supple.  Lymphadenopathy:     Cervical: No cervical adenopathy.  Skin:    General: Skin is warm and dry.     Findings: No  erythema or rash.  Neurological:     Mental Status: She is alert and oriented to person, place, and time.  Psychiatric:        Behavior: Behavior normal.     Comments: Well groomed, good eye contact, normal speech and thoughts       Results for orders placed or performed in visit on 10/15/22  CBC  Result Value Ref Range   WBC 6.6 3.8 - 10.8 Thousand/uL   RBC 4.41 3.80 - 5.10 Million/uL   Hemoglobin 12.9 11.7 - 15.5 g/dL   HCT 38.3 35.0 - 45.0 %   MCV 86.8 80.0 - 100.0 fL   MCH 29.3 27.0 - 33.0 pg   MCHC 33.7 32.0 - 36.0 g/dL   RDW 13.6 11.0 - 15.0 %   Platelets 252 140 - 400 Thousand/uL   MPV 11.8 7.5 - 12.5 fL  COMPLETE METABOLIC PANEL WITH GFR  Result Value Ref Range   Glucose, Bld 68 65 - 99 mg/dL   BUN 10 7 - 25 mg/dL   Creat 0.77 0.50 - 0.96 mg/dL   eGFR 110 > OR = 60 mL/min/1.76m   BUN/Creatinine Ratio SEE NOTE: 6 - 22 (calc)   Sodium 139 135 - 146 mmol/L   Potassium 4.0 3.5 - 5.3 mmol/L   Chloride 105 98 -  110 mmol/L   CO2 27 20 - 32 mmol/L   Calcium 8.9 8.6 - 10.2 mg/dL   Total Protein 7.5 6.1 - 8.1 g/dL   Albumin 4.5 3.6 - 5.1 g/dL   Globulin 3.0 1.9 - 3.7 g/dL (calc)   AG Ratio 1.5 1.0 - 2.5 (calc)   Total Bilirubin 0.2 0.2 - 1.2 mg/dL   Alkaline phosphatase (APISO) 74 31 - 125 U/L   AST 15 10 - 30 U/L   ALT 16 6 - 29 U/L  TSH  Result Value Ref Range   TSH 1.89 mIU/L  TEST AUTHORIZATION  Result Value Ref Range   TEST NAME: TSH    TEST CODE: 899    CLIENT CONTACT: LAURA    REPORT ALWAYS MESSAGE SIGNATURE        Assessment & Plan:   Problem List Items Addressed This Visit     Dizziness   Other Visit Diagnoses     POTS (postural orthostatic tachycardia syndrome)    -  Primary   Inappropriate sinus tachycardia           Likely POTS diagnosis Following 2021 pregnancy  [facility]  Try to improve fluid intake and rehydration status  Goal to add sports drinks / electrolytes  Water and salt intake -- Oral fluid intake should be encouraged to a target of 3 L daily and a daily salt intake of 8 to 12 g of sodium chloride (3.2 to 4.8 g of sodium) [55,84,104,116]. Available sources of sodium include table salt, sports tablets, sports beverages, oral rehydration salts, and some soups   Continue Meclizine as needed for dizzy spells.  Future consider Midodrine for raising BP as needed for symptoms if indicated  No orders of the defined types were placed in this encounter.     Follow up plan: Return if symptoms worsen or fail to improve.   ANobie Putnam [facility] 12/11/2022, 10:26 AM

## 2022-12-11 NOTE — Patient Instructions (Addendum)
Thank you for coming to the office today.  Likely POTS diagnosis  We can refer to [facility], back up plan can be [facility]  Depends on available apt time / referral.  Stay tuned, we will work on referring, if you don't hear back within 2-3 weeks then please let me know.  Try to improve fluid intake and rehydration status  Goal to add sports drinks / electrolytes  Water and salt intake -- Oral fluid intake should be encouraged to a target of 3 L daily and a daily salt intake of 8 to 12 g of sodium chloride (3.2 to 4.8 g of sodium) [55,84,104,116]. Available sources of sodium include table salt, sports tablets, sports beverages, oral rehydration salts, and some soups   Continue Meclizine as needed for dizzy spells.  Future consider Midodrine for raising BP as needed for symptoms if indicated  Please schedule a Follow-up Appointment to: Return if symptoms worsen or fail to improve.  If you have any other questions or concerns, please feel free to call the office or send a message through [place]. You may also schedule an earlier appointment if necessary.  Additionally, you may be receiving a survey about your experience at our office within a few days to 1 week by e-mail or mail. We value your feedback.  Patricia Putnam, DO [facility]

## 2022-12-12 ENCOUNTER — Encounter: Payer: Self-pay | Admitting: Family Medicine

## 2022-12-18 DIAGNOSIS — R42 Dizziness and giddiness: Secondary | ICD-10-CM | POA: Diagnosis not present

## 2022-12-18 DIAGNOSIS — R000 Tachycardia, unspecified: Secondary | ICD-10-CM | POA: Diagnosis not present

## 2022-12-18 DIAGNOSIS — Z7689 Persons encountering health services in other specified circumstances: Secondary | ICD-10-CM | POA: Diagnosis not present

## 2022-12-28 DIAGNOSIS — R42 Dizziness and giddiness: Secondary | ICD-10-CM | POA: Diagnosis not present

## 2022-12-28 DIAGNOSIS — R000 Tachycardia, unspecified: Secondary | ICD-10-CM | POA: Diagnosis not present

## 2022-12-29 DIAGNOSIS — H7292 Unspecified perforation of tympanic membrane, left ear: Secondary | ICD-10-CM | POA: Diagnosis not present

## 2022-12-31 DIAGNOSIS — F410 Panic disorder [episodic paroxysmal anxiety] without agoraphobia: Secondary | ICD-10-CM | POA: Diagnosis not present

## 2022-12-31 DIAGNOSIS — F320 Major depressive disorder, single episode, mild: Secondary | ICD-10-CM | POA: Diagnosis not present

## 2022-12-31 DIAGNOSIS — F411 Generalized anxiety disorder: Secondary | ICD-10-CM | POA: Diagnosis not present

## 2023-01-16 DIAGNOSIS — R42 Dizziness and giddiness: Secondary | ICD-10-CM | POA: Diagnosis not present

## 2023-01-16 DIAGNOSIS — R000 Tachycardia, unspecified: Secondary | ICD-10-CM | POA: Diagnosis not present

## 2023-01-16 DIAGNOSIS — R0602 Shortness of breath: Secondary | ICD-10-CM | POA: Diagnosis not present

## 2023-05-23 IMAGING — CT CT NECK W/O CM
2 of 3 series · 8 of 14 positions shown, 10 images · non-contrast
Comparison: None.

CLINICAL DATA: Salivary gland stone.

EXAM:
CT NECK WITHOUT CONTRAST
TECHNIQUE: Multidetector CT imaging of the neck was performed following the
standard protocol without intravenous contrast.

[Series 2: axial neck · axial · 0.53mm/px · z∈[-537,-433]mm · 3 of 104 slices shown]
[im 26/104  bone]
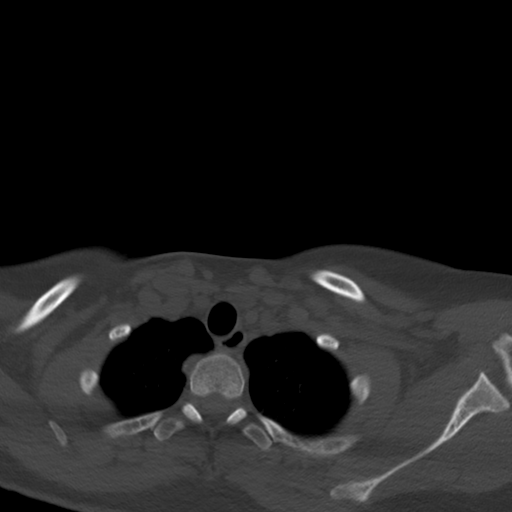
[im 52/104  bone]
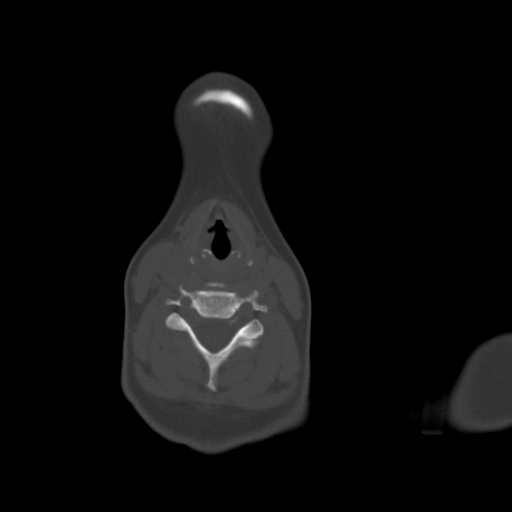
[im 78/104  bone]
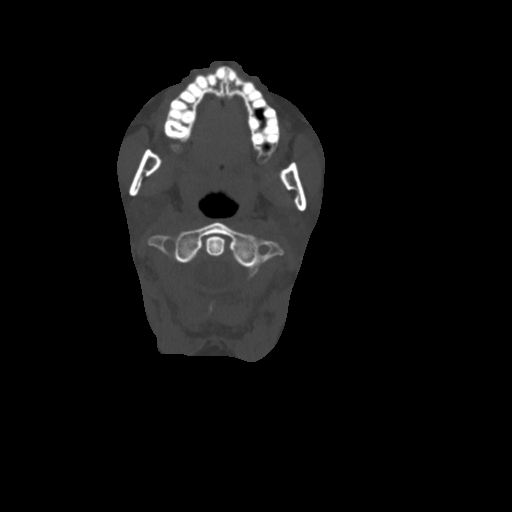

[Series 7: orthogonal ax · axial · 0.38mm/px · z∈[-623,-448]mm · 5 of 158 slices shown, 7 images]
[im 27/158  soft-tissue]
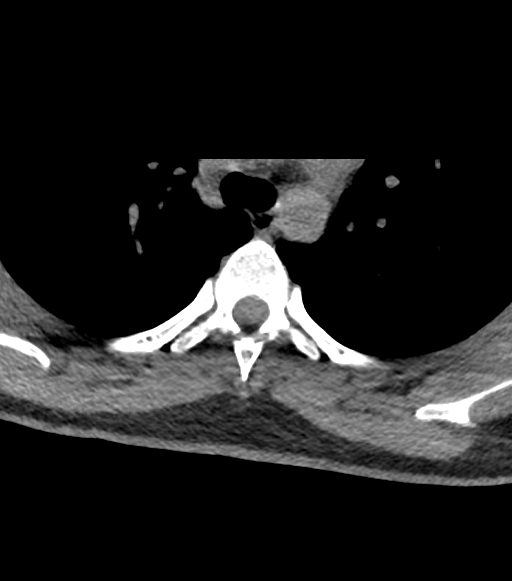
[im 27/158  bone]
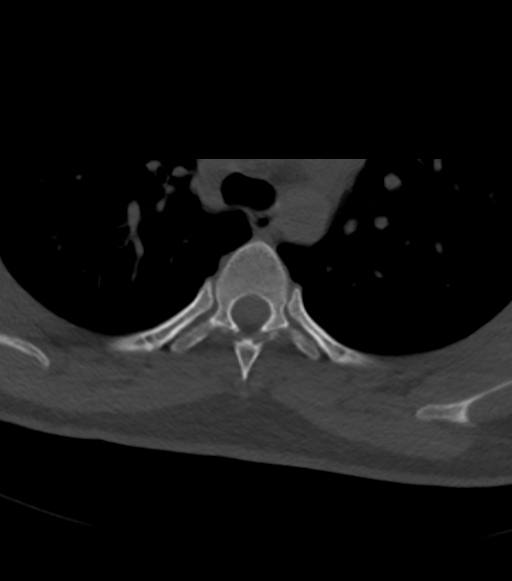
[im 53/158  bone]
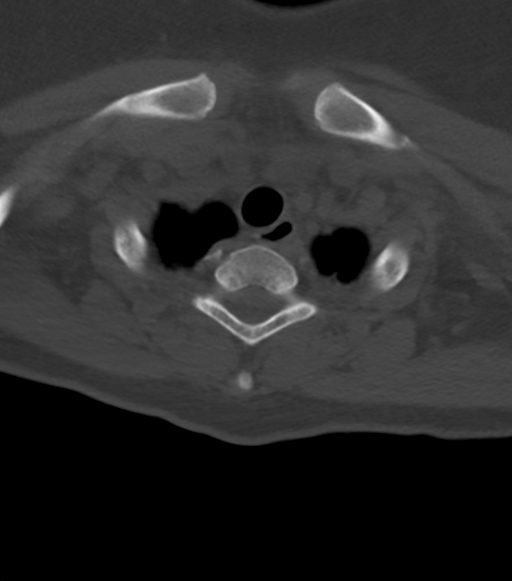
[im 79/158  bone]
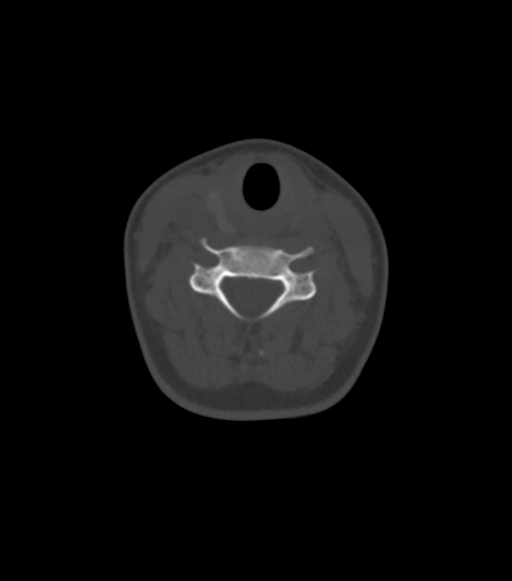
[im 105/158  bone]
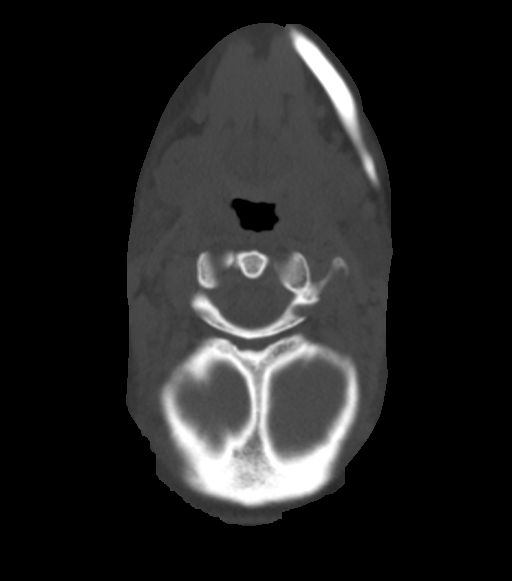
[im 131/158  soft-tissue]
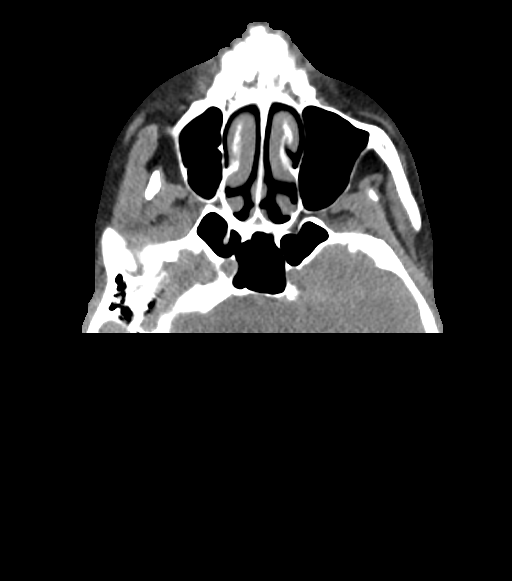
[im 131/158  bone]
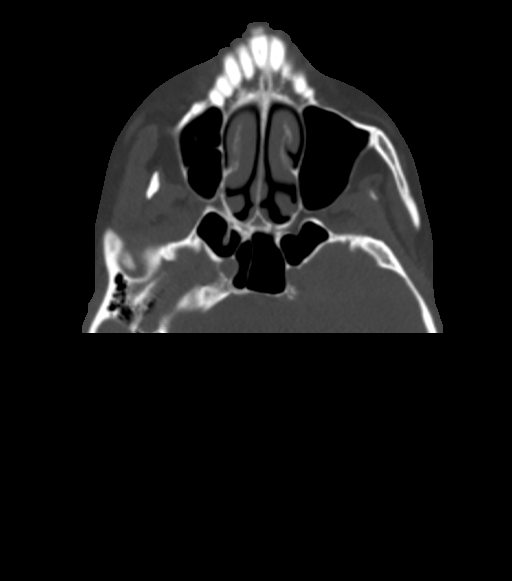

[8 of 14 positions shown; findings below may reference images not displayed]

FINDINGS: Pharynx and larynx: Normal. No mass or swelling. Calcification in
the right palatine tonsil, most likely a tonsillith. Incompletely
ossified thyroid cartilage.

Salivary glands: The submandibular and parotid glands are within
normal limits and symmetric in size. No surrounding fat stranding to
suggest inflammatory change. There is mild dilation/prominence of
bilateral submandibular ducts, particularly distally. No evidence of
a radiodense calculus.

Thyroid: Normal.

Lymph nodes: None enlarged or abnormal density. Mildly prominent but
nonenlarged bilateral jugulodigastric nodes, nonspecific but
frequently seen in patients this age.

Vascular: Nondiagnostic evaluation due to absence of contrast.

Limited intracranial: No obvious acute abnormality in the visualized
brain with limited evaluation.

Visualized orbits: Negative.

Mastoids and visualized paranasal sinuses: Opacified left anterior
ethmoid air cell. Otherwise, visualized sinuses are clear. No
mastoid effusions.

Skeleton: No acute or aggressive process.

Upper chest: Mild biapical pleuroparenchymal scarring. Otherwise,
lung apices are clear.
IMPRESSION: Unremarkable appearance of the submandibular and parotid glands
without inflammatory change. Mild dilation/prominence of bilateral
submandibular ducts without visible radiodense calculus.
